# Patient Record
Sex: Female | Born: 1960 | Race: Black or African American | Hispanic: No | Marital: Single
Health system: Southern US, Community
[De-identification: ages and names within clinical notes are randomized; demographics above are authoritative.]

## PROBLEM LIST (undated history)

## (undated) DIAGNOSIS — E119 Type 2 diabetes mellitus without complications: Secondary | ICD-10-CM

## (undated) DIAGNOSIS — K219 Gastro-esophageal reflux disease without esophagitis: Secondary | ICD-10-CM

## (undated) DIAGNOSIS — M1990 Unspecified osteoarthritis, unspecified site: Secondary | ICD-10-CM

## (undated) DIAGNOSIS — G4730 Sleep apnea, unspecified: Secondary | ICD-10-CM

## (undated) DIAGNOSIS — R011 Cardiac murmur, unspecified: Secondary | ICD-10-CM

## (undated) DIAGNOSIS — D649 Anemia, unspecified: Secondary | ICD-10-CM

## (undated) DIAGNOSIS — I10 Essential (primary) hypertension: Secondary | ICD-10-CM

## (undated) DIAGNOSIS — K469 Unspecified abdominal hernia without obstruction or gangrene: Secondary | ICD-10-CM

## (undated) DIAGNOSIS — F329 Major depressive disorder, single episode, unspecified: Secondary | ICD-10-CM

## (undated) DIAGNOSIS — T4145XA Adverse effect of unspecified anesthetic, initial encounter: Secondary | ICD-10-CM

## (undated) DIAGNOSIS — T8859XA Other complications of anesthesia, initial encounter: Secondary | ICD-10-CM

## (undated) DIAGNOSIS — F32A Depression, unspecified: Secondary | ICD-10-CM

## (undated) DIAGNOSIS — J381 Polyp of vocal cord and larynx: Secondary | ICD-10-CM

## (undated) DIAGNOSIS — J45909 Unspecified asthma, uncomplicated: Secondary | ICD-10-CM

## (undated) DIAGNOSIS — E039 Hypothyroidism, unspecified: Secondary | ICD-10-CM

## (undated) DIAGNOSIS — I639 Cerebral infarction, unspecified: Secondary | ICD-10-CM

## (undated) HISTORY — PX: BREAST BIOPSY: SHX20

## (undated) HISTORY — DX: Type 2 diabetes mellitus without complications: E11.9

## (undated) HISTORY — DX: Hypothyroidism, unspecified: E03.9

## (undated) HISTORY — PX: FOOT SURGERY: SHX648

## (undated) HISTORY — DX: Essential (primary) hypertension: I10

## (undated) HISTORY — PX: BREAST SURGERY: SHX581

## (undated) HISTORY — DX: Unspecified asthma, uncomplicated: J45.909

---

## 2004-05-05 HISTORY — PX: GASTRIC BYPASS: SHX52

## 2010-12-11 ENCOUNTER — Other Ambulatory Visit: Payer: Self-pay | Admitting: Family Medicine

## 2010-12-11 DIAGNOSIS — N63 Unspecified lump in unspecified breast: Secondary | ICD-10-CM

## 2010-12-18 ENCOUNTER — Ambulatory Visit
Admission: RE | Admit: 2010-12-18 | Discharge: 2010-12-18 | Disposition: A | Discharge status: Home / Self Care | Payer: Self-pay | Source: Ambulatory Visit | Attending: Family Medicine | Admitting: Family Medicine

## 2010-12-18 DIAGNOSIS — N63 Unspecified lump in unspecified breast: Secondary | ICD-10-CM

## 2013-05-05 HISTORY — PX: HERNIA REPAIR: SHX51

## 2013-11-21 DIAGNOSIS — M7989 Other specified soft tissue disorders: Secondary | ICD-10-CM | POA: Insufficient documentation

## 2014-12-06 DIAGNOSIS — F1120 Opioid dependence, uncomplicated: Secondary | ICD-10-CM | POA: Insufficient documentation

## 2015-05-06 HISTORY — PX: CYST EXCISION: SHX5701

## 2015-06-15 ENCOUNTER — Ambulatory Visit: Payer: Self-pay | Admitting: Internal Medicine

## 2016-03-14 DIAGNOSIS — E039 Hypothyroidism, unspecified: Secondary | ICD-10-CM | POA: Diagnosis not present

## 2016-03-14 DIAGNOSIS — Z23 Encounter for immunization: Secondary | ICD-10-CM | POA: Diagnosis not present

## 2016-03-14 DIAGNOSIS — I10 Essential (primary) hypertension: Secondary | ICD-10-CM | POA: Diagnosis not present

## 2016-03-14 DIAGNOSIS — E119 Type 2 diabetes mellitus without complications: Secondary | ICD-10-CM | POA: Diagnosis not present

## 2016-03-14 DIAGNOSIS — K432 Incisional hernia without obstruction or gangrene: Secondary | ICD-10-CM | POA: Diagnosis not present

## 2016-03-19 DIAGNOSIS — I10 Essential (primary) hypertension: Secondary | ICD-10-CM | POA: Diagnosis not present

## 2016-03-19 DIAGNOSIS — E039 Hypothyroidism, unspecified: Secondary | ICD-10-CM | POA: Diagnosis not present

## 2016-03-19 DIAGNOSIS — Z780 Asymptomatic menopausal state: Secondary | ICD-10-CM | POA: Diagnosis not present

## 2016-03-19 DIAGNOSIS — E119 Type 2 diabetes mellitus without complications: Secondary | ICD-10-CM | POA: Diagnosis not present

## 2016-03-19 DIAGNOSIS — E782 Mixed hyperlipidemia: Secondary | ICD-10-CM | POA: Diagnosis not present

## 2016-03-21 DIAGNOSIS — D649 Anemia, unspecified: Secondary | ICD-10-CM | POA: Diagnosis not present

## 2016-03-21 DIAGNOSIS — E039 Hypothyroidism, unspecified: Secondary | ICD-10-CM | POA: Diagnosis not present

## 2016-03-21 DIAGNOSIS — E782 Mixed hyperlipidemia: Secondary | ICD-10-CM | POA: Diagnosis not present

## 2016-03-21 DIAGNOSIS — E119 Type 2 diabetes mellitus without complications: Secondary | ICD-10-CM | POA: Diagnosis not present

## 2016-04-02 DIAGNOSIS — D500 Iron deficiency anemia secondary to blood loss (chronic): Secondary | ICD-10-CM | POA: Diagnosis not present

## 2016-04-02 DIAGNOSIS — E039 Hypothyroidism, unspecified: Secondary | ICD-10-CM | POA: Diagnosis not present

## 2016-04-02 DIAGNOSIS — E782 Mixed hyperlipidemia: Secondary | ICD-10-CM | POA: Diagnosis not present

## 2016-04-02 DIAGNOSIS — R9431 Abnormal electrocardiogram [ECG] [EKG]: Secondary | ICD-10-CM | POA: Diagnosis not present

## 2016-04-18 DIAGNOSIS — R9431 Abnormal electrocardiogram [ECG] [EKG]: Secondary | ICD-10-CM | POA: Diagnosis not present

## 2016-04-18 DIAGNOSIS — I10 Essential (primary) hypertension: Secondary | ICD-10-CM | POA: Diagnosis not present

## 2016-04-18 DIAGNOSIS — Z01818 Encounter for other preprocedural examination: Secondary | ICD-10-CM | POA: Diagnosis not present

## 2016-04-18 DIAGNOSIS — R011 Cardiac murmur, unspecified: Secondary | ICD-10-CM | POA: Diagnosis not present

## 2016-04-24 DIAGNOSIS — R0602 Shortness of breath: Secondary | ICD-10-CM | POA: Diagnosis not present

## 2016-04-24 DIAGNOSIS — R9431 Abnormal electrocardiogram [ECG] [EKG]: Secondary | ICD-10-CM | POA: Diagnosis not present

## 2016-04-24 DIAGNOSIS — I10 Essential (primary) hypertension: Secondary | ICD-10-CM | POA: Diagnosis not present

## 2016-04-30 DIAGNOSIS — R011 Cardiac murmur, unspecified: Secondary | ICD-10-CM | POA: Diagnosis not present

## 2016-05-07 ENCOUNTER — Encounter: Payer: Self-pay | Admitting: Physician Assistant

## 2016-05-07 DIAGNOSIS — E039 Hypothyroidism, unspecified: Secondary | ICD-10-CM | POA: Diagnosis not present

## 2016-05-07 DIAGNOSIS — D500 Iron deficiency anemia secondary to blood loss (chronic): Secondary | ICD-10-CM | POA: Diagnosis not present

## 2016-05-13 DIAGNOSIS — I10 Essential (primary) hypertension: Secondary | ICD-10-CM | POA: Diagnosis not present

## 2016-05-13 DIAGNOSIS — I351 Nonrheumatic aortic (valve) insufficiency: Secondary | ICD-10-CM | POA: Diagnosis not present

## 2016-05-13 DIAGNOSIS — R9431 Abnormal electrocardiogram [ECG] [EKG]: Secondary | ICD-10-CM | POA: Diagnosis not present

## 2016-05-13 DIAGNOSIS — E7800 Pure hypercholesterolemia, unspecified: Secondary | ICD-10-CM | POA: Diagnosis not present

## 2016-05-14 ENCOUNTER — Encounter: Payer: Self-pay | Admitting: Physician Assistant

## 2016-05-14 DIAGNOSIS — I10 Essential (primary) hypertension: Secondary | ICD-10-CM | POA: Diagnosis not present

## 2016-05-14 DIAGNOSIS — E039 Hypothyroidism, unspecified: Secondary | ICD-10-CM | POA: Diagnosis not present

## 2016-05-14 DIAGNOSIS — D500 Iron deficiency anemia secondary to blood loss (chronic): Secondary | ICD-10-CM | POA: Diagnosis not present

## 2016-05-14 DIAGNOSIS — E1165 Type 2 diabetes mellitus with hyperglycemia: Secondary | ICD-10-CM | POA: Diagnosis not present

## 2016-05-22 ENCOUNTER — Ambulatory Visit: Payer: Self-pay | Admitting: Physician Assistant

## 2016-05-30 ENCOUNTER — Encounter: Payer: Self-pay | Admitting: Physician Assistant

## 2016-05-30 ENCOUNTER — Encounter (INDEPENDENT_AMBULATORY_CARE_PROVIDER_SITE_OTHER): Payer: Self-pay

## 2016-05-30 ENCOUNTER — Ambulatory Visit (INDEPENDENT_AMBULATORY_CARE_PROVIDER_SITE_OTHER): Payer: Medicare Other | Admitting: Physician Assistant

## 2016-05-30 VITALS — BP 128/74 | HR 76 | Ht <= 58 in | Wt 135.0 lb

## 2016-05-30 DIAGNOSIS — D509 Iron deficiency anemia, unspecified: Secondary | ICD-10-CM

## 2016-05-30 DIAGNOSIS — K219 Gastro-esophageal reflux disease without esophagitis: Secondary | ICD-10-CM | POA: Diagnosis not present

## 2016-05-30 MED ORDER — NA SULFATE-K SULFATE-MG SULF 17.5-3.13-1.6 GM/177ML PO SOLN: 1.0000 | ORAL | 0 refills | Status: DC

## 2016-05-30 NOTE — Patient Instructions (Signed)

## 2016-05-30 NOTE — Progress Notes (Signed)
Reviewed and agree with management plan.  Nishant Schrecengost T. Verley Pariseau, MD FACG 

## 2016-05-30 NOTE — Progress Notes (Signed)
Chief Complaint: IDA, GERD  HPI:  Paula Hall is a 56 year old African-American female with a past medical history of asthma, diabetes, hypertension and hypothyroidism, status post gastric bypass in 2016 and multiple abdominal hernia repairs, who presents to clinic today as a referral from Dr. Ashby Dawes for a complaint of iron deficiency anemia.   Patient had recent labs completed 05/07/16 which showed a CBC with a hemoglobin low at 10 as well as an MCV low at 76.4, ferritin was low at 13, percent ferritin saturation was low at 9, CMP showed an alkaline phosphatase elevated at 130 and was otherwise normal   Today, the patient presents to clinic and tells me that she recently moved to [place] from [place] around 8 months ago. It should be noted that she is a poor historian and very hard to follow. She has a history of gastric bypass in 2016 per her report. She also has history of multiple abdominal hernia wall repairs for which she continues to wear a stomach support brace. The patient tells me that she has been anemic for "a couple of years", and apparently has been on iron since that time, which is slowly improving her hemoglobin. She denies ever having a previous colonoscopy but does believe she had an endoscopy before her gastric bypass.     Patient admits that she has chronic reflux and is controlled on Omeprazole 40 mg daily.     She does note that she has lost around 75 pounds in the past few years but believes most of this was related to an uncontrolled thyroid level.   Patient is an asthmatic but denies being on oxygen at home or previous seizures or problems with anesthesia in the past.   Patient denies fever, chills, seeing bright red blood in her stool, melena, change in bowel habits, fatigue, anorexia, abdominal pain or symptoms that awaken her at night.  Past Medical History:  Diagnosis Date  . Asthma   . Diabetes ([place])   . Hypertension   . Hypothyroid     Past Surgical  History:  Procedure Laterality Date  . CYST EXCISION  2017   abdomen  . FOOT SURGERY    . GASTRIC BYPASS  2006  . HERNIA REPAIR  2015    Current Outpatient Prescriptions  Medication Sig Dispense Refill  . albuterol (PROVENTIL HFA;VENTOLIN HFA) 108 (90 Base) MCG/ACT inhaler Inhale into the lungs every 6 (six) hours as needed for wheezing or shortness of breath.    Marland Kitchen amLODipine (NORVASC) 10 MG tablet Take 10 mg by mouth daily.    Marland Kitchen ammonium lactate (AMLACTIN) 12 % cream Apply topically as needed for dry skin.    Marland Kitchen beclomethasone (QVAR) 40 MCG/ACT inhaler Inhale 2 puffs into the lungs 2 (two) times daily as needed.    . betamethasone dipropionate (DIPROLENE) 0.05 % cream Apply topically 2 (two) times daily.    . clotrimazole (LOTRIMIN) 1 % cream Apply 1 application topically 2 (two) times daily.    . Diclofenac Sodium 1.5 % SOLN Place onto the skin.    . famotidine (PEPCID) 20 MG tablet Take 20 mg by mouth 2 (two) times daily.    Marland Kitchen ibuprofen (ADVIL,MOTRIN) 800 MG tablet Take 800 mg by mouth every 8 (eight) hours as needed.    . Lancets (FREESTYLE) lancets 1 each by Other route as needed for other. Use as instructed    . meloxicam (MOBIC) 15 MG tablet Take 15 mg by mouth daily.    . metoprolol  tartrate (LOPRESSOR) 25 MG tablet Take 25 mg by mouth 2 (two) times daily.    . nicotine (NICODERM CQ - DOSED IN MG/24 HOURS) 14 mg/24hr patch Place 14 mg onto the skin daily.    Marland Kitchen omeprazole (PRILOSEC) 40 MG capsule Take 40 mg by mouth daily.    . ranitidine (ZANTAC) 300 MG capsule Take 300 mg by mouth every evening.     No current facility-administered medications for this visit.     Allergies as of 05/30/2016  . (No Known Allergies)    Family History  Problem Relation Age of Onset  . Kidney cancer Mother   . Other Father     murder  . Colon cancer Neg Hx   . Stomach cancer Neg Hx     Social History   Social History  . Marital status: Single    Spouse name: N/A  . Number of  children: N/A  . Years of education: N/A   Occupational History  . retired    Social History Main Topics  . Smoking status: Current Every Day Smoker    Types: Cigarettes  . Smokeless tobacco: Never Used  . Alcohol use Yes     Comment: occ   . Drug use: No  . Sexual activity: No   Other Topics Concern  . Not on file   Social History Narrative  . No narrative on file    Review of Systems:    Constitutional: No weight loss, fever, chills, weakness or fatigue HEENT: Eyes: No change in vision               Ears, Nose, Throat:  Positive for sore throat and voice changes Skin: Positive for itching and skin rash Cardiovascular: No chest pain, chest pressure or palpitations   Respiratory: Positive for shortness of breath Gastrointestinal: See HPI and otherwise negative Genitourinary: No dysuria or change in urinary frequency Neurological: No headache, dizziness or syncope Musculoskeletal: Positive for chronic back pain Hematologic: No bleeding or bruising Psychiatric: Positive for depression and sleeping problems   Physical Exam:  Vital signs: BP 128/74   Pulse 76   Ht 4\' 10"  (1.473 m)   Wt 135 lb (61.2 kg)   BMI 28.22 kg/m   Constitutional: African American female appears to be in NAD, Well developed, Well nourished, alert and cooperative Head:  Normocephalic and atraumatic. Eyes:   PEERL, EOMI. No icterus. Conjunctiva pink. Ears:  Normal auditory acuity. Neck:  Supple Throat: Oral cavity and pharynx without inflammation, swelling or lesion.  Respiratory: Respirations even and unlabored. Lungs clear to auscultation bilaterally.   No wheezes, crackles, or rhonchi.  Cardiovascular: Normal S1, S2. No MRG. Regular rate and rhythm. No peripheral edema, cyanosis or pallor.  Gastrointestinal:  Soft, nondistended, mild TTP over area of abdominal wall hernia. No rebound or guarding. Normal bowel sounds. No appreciable masses or hepatomegaly. Rectal:  Not performed.  Msk:   Symmetrical without gross deformities. Without edema, no deformity or joint abnormality.  Neurologic:  Alert and  oriented x4;  grossly normal neurologically.  Skin:   Dry and intact without significant lesions or rashes. Psychiatric: Demonstrates good judgement and reason without abnormal affect or behaviors.  See history of present illness for recent labs.  Assessment: 1. IDA: See labs in history of present illness, patient currently on iron supplement, no previous workup, history of reflux; consider upper versus lower source of GI blood loss versus anemia after gastric bypass 2. GERD: Controlled on Omeprazole 40 mg daily  Plan: 1. Recommend patient proceed with an EGD and colonoscopy for further evaluation of her iron deficiency anemia. These were scheduled with Dr. Fuller Plan and [place]. Discussed risks, benefits, limitations and alternatives and the patient agrees to proceed. 2. We will request cardiac clearance as patient recently had a workup for a new heart murmur. Per referring physicians note, she has been cleared for procedures but we will request clarification. 3. Patient to follow with her PCP regarding abdominal wall hernia and possible referral to a surgeon in the area 4. Continue iron supplementation as previously prescribed 5. Patient to follow in clinic for Dr. Lynne Leader recommendations after time of procedures.  Ellouise Newer, PA-C [facility] 05/30/2016, 9:58 AM  Cc: Dr. Ashby Dawes

## 2016-06-12 DIAGNOSIS — J069 Acute upper respiratory infection, unspecified: Secondary | ICD-10-CM | POA: Diagnosis not present

## 2016-06-12 DIAGNOSIS — R05 Cough: Secondary | ICD-10-CM | POA: Diagnosis not present

## 2016-07-24 ENCOUNTER — Telehealth: Payer: Self-pay | Admitting: Gastroenterology

## 2016-07-24 NOTE — Telephone Encounter (Signed)
Returned patient's call and she states that she ate breakfast ham, eggs, and cheese with coffee and a ham and cheese sandwich on toast for lunch around 3:30 pm.  She is scheduled for ECL tomorrow at 2:30 and will start her prep at 6:00 pm today.  I advised her not to eat anything else solid today but to follow the list exactly as it is on her instructions.  Expressed the importance of following the directions exactly as given.  Told patient that she cannot have anything by mouth 3 hours prior to her procedure. She asked about taking her medications and I advised her that she can take her medications in the morning as long as it is before her cut off time.  Told her not to take her diabetic medications in the morning.  She will arrive at 1:30 pm for a 2:30 procedure tomorrow.  All questions were answered.

## 2016-07-25 ENCOUNTER — Ambulatory Visit (AMBULATORY_SURGERY_CENTER): Payer: Medicare Other | Admitting: Gastroenterology

## 2016-07-25 ENCOUNTER — Encounter: Payer: Self-pay | Admitting: Gastroenterology

## 2016-07-25 VITALS — BP 122/70 | HR 61 | Temp 98.6°F | Resp 12 | Ht <= 58 in | Wt 135.0 lb

## 2016-07-25 DIAGNOSIS — K219 Gastro-esophageal reflux disease without esophagitis: Secondary | ICD-10-CM | POA: Diagnosis not present

## 2016-07-25 DIAGNOSIS — D120 Benign neoplasm of cecum: Secondary | ICD-10-CM

## 2016-07-25 DIAGNOSIS — D649 Anemia, unspecified: Secondary | ICD-10-CM | POA: Diagnosis not present

## 2016-07-25 DIAGNOSIS — K2950 Unspecified chronic gastritis without bleeding: Secondary | ICD-10-CM | POA: Diagnosis not present

## 2016-07-25 DIAGNOSIS — D126 Benign neoplasm of colon, unspecified: Secondary | ICD-10-CM

## 2016-07-25 DIAGNOSIS — D124 Benign neoplasm of descending colon: Secondary | ICD-10-CM

## 2016-07-25 DIAGNOSIS — K621 Rectal polyp: Secondary | ICD-10-CM | POA: Diagnosis not present

## 2016-07-25 DIAGNOSIS — D508 Other iron deficiency anemias: Secondary | ICD-10-CM

## 2016-07-25 DIAGNOSIS — K635 Polyp of colon: Secondary | ICD-10-CM | POA: Diagnosis not present

## 2016-07-25 DIAGNOSIS — D125 Benign neoplasm of sigmoid colon: Secondary | ICD-10-CM

## 2016-07-25 DIAGNOSIS — D128 Benign neoplasm of rectum: Secondary | ICD-10-CM

## 2016-07-25 MED ORDER — SODIUM CHLORIDE 0.9 % IV SOLN: 500.0000 mL | INTRAVENOUS | Status: AC

## 2016-07-25 NOTE — Patient Instructions (Signed)
YOU HAD AN ENDOSCOPIC PROCEDURE TODAY AT [facility]:   Refer to the procedure report that was given to you for any specific questions about what was found during the examination.  If the procedure report does not answer your questions, please call your gastroenterologist to clarify.  If you requested that your care partner not be given the details of your procedure findings, then the procedure report has been included in a sealed envelope for you to review at your convenience later.  YOU SHOULD EXPECT: Some feelings of bloating in the abdomen. Passage of more gas than usual.  Walking can help get rid of the air that was put into your GI tract during the procedure and reduce the bloating. If you had a lower endoscopy (such as a colonoscopy or flexible sigmoidoscopy) you may notice spotting of blood in your stool or on the toilet paper. If you underwent a bowel prep for your procedure, you may not have a normal bowel movement for a few days.  Please Note:  You might notice some irritation and congestion in your nose or some drainage.  This is from the oxygen used during your procedure.  There is no need for concern and it should clear up in a day or so.  SYMPTOMS TO REPORT IMMEDIATELY:   Following lower endoscopy (colonoscopy or flexible sigmoidoscopy):  Excessive amounts of blood in the stool  Significant tenderness or worsening of abdominal pains  Swelling of the abdomen that is new, acute  Fever of 100F or higher   Following upper endoscopy (EGD)  Vomiting of blood or coffee ground material  New chest pain or pain under the shoulder blades  Painful or persistently difficult swallowing  New shortness of breath  Fever of 100F or higher  Black, tarry-looking stools  For urgent or emergent issues, a gastroenterologist can be reached at any hour by calling [phone].   DIET:  We do recommend a small meal at first, but then you may proceed to your regular diet.  Drink  plenty of fluids but you should avoid alcoholic beverages for 24 hours.  ACTIVITY:  You should plan to take it easy for the rest of today and you should NOT DRIVE or use heavy machinery until tomorrow (because of the sedation medicines used during the test).    FOLLOW UP: Our staff will call the number listed on your records the next business day following your procedure to check on you and address any questions or concerns that you may have regarding the information given to you following your procedure. If we do not reach you, we will leave a message.  However, if you are feeling well and you are not experiencing any problems, there is no need to return our call.  We will assume that you have returned to your regular daily activities without incident.  If any biopsies were taken you will be contacted by phone or by letter within the next 1-3 weeks.  Please call us at [phone] if you have not heard about the biopsies in 3 weeks.    SIGNATURES/CONFIDENTIALITY: You and/or your care partner have signed paperwork which will be entered into your electronic medical record.  These signatures attest to the fact that that the information above on your After Visit Summary has been reviewed and is understood.  Full responsibility of the confidentiality of this discharge information lies with you and/or your care-partner.  Polyp and hiatal hernia information given. No aspirin, ibuprofen, naproxen or  other non -steroidal anti-inflammatory for 2 weeks.

## 2016-07-25 NOTE — Progress Notes (Signed)
Pt's states no medical or surgical changes since previsit or office visit. 

## 2016-07-25 NOTE — Op Note (Signed)
[place] Patient Name: Paula Hall Procedure Date: 07/25/2016 2:19 PM MRN: [id number] Endoscopist: Ladene Artist , MD Age: 56 Referring MD:  Date of Birth: 1960/12/02 Gender: Female Account #: [id number] Procedure:                Colonoscopy Indications:              Iron deficiency anemia Medicines:                Monitored Anesthesia Care Procedure:                Pre-Anesthesia Assessment:                           - Prior to the procedure, a History and Physical                            was performed, and patient medications and                            allergies were reviewed. The patient's tolerance of                            previous anesthesia was also reviewed. The risks                            and benefits of the procedure and the sedation                            options and risks were discussed with the patient.                            All questions were answered, and informed consent                            was obtained. Prior Anticoagulants: The patient has                            taken no previous anticoagulant or antiplatelet                            agents. ASA Grade Assessment: III - A patient with                            severe systemic disease. After reviewing the risks                            and benefits, the patient was deemed in                            satisfactory condition to undergo the procedure.                           After obtaining informed consent, the colonoscope  was passed under direct vision. Throughout the                            procedure, the patient's blood pressure, pulse, and                            oxygen saturations were monitored continuously. The                            Colonoscope was introduced through the anus and                            advanced to the the cecum, identified by                            appendiceal orifice and ileocecal valve. The                             ileocecal valve, appendiceal orifice, and rectum                            were photographed. The quality of the bowel                            preparation was good. The colonoscopy was performed                            without difficulty. The patient tolerated the                            procedure well. Scope In: 2:24:26 PM Scope Out: 2:48:45 PM Scope Withdrawal Time: 0 hours 22 minutes 46 seconds  Total Procedure Duration: 0 hours 24 minutes 19 seconds  Findings:                 The perianal and digital rectal examinations were                            normal.                           A 10 mm polyp was found in the cecum. The polyp was                            pedunculated. The polyp was removed with a hot                            snare. Resection and retrieval were complete.                           Four sessile and semi-pedunculated polyps were                            found in the rectum (1) and sigmoid colon (3). The  polyps were 8 to 12 mm in size. These polyps were                            removed with a hot snare. Resection and retrieval                            were complete.                           34 sessile sessile polyps were found in the rectum                            (6), sigmoid colon (27) and descending colon (1).                            The polyps were 5 to 8 mm in size. These polyps                            were removed with a cold snare. Resection and                            retrieval were complete.                           The exam was otherwise without abnormality on                            direct and retroflexion views. Complications:            No immediate complications. Estimated blood loss:                            None. Estimated Blood Loss:     Estimated blood loss: none. Impression:               - One 10 mm polyp in the cecum, removed with a hot                             snare. Resected and retrieved.                           - Four 8 to 12 mm polyps in the rectum and in the                            sigmoid colon, removed with a hot snare. Resected                            and retrieved.                           - 34 sessile 5 to 8 mm polyps in the rectum, in the                            sigmoid colon and  in the descending colon, removed                            with a cold snare. Resected and retrieved.                           - The examination was otherwise normal on direct                            and retroflexion views. Recommendation:           - Repeat colonoscopy for surveillance based on                            pathology results.                           - Patient has a contact number available for                            emergencies. The signs and symptoms of potential                            delayed complications were discussed with the                            patient. Return to normal activities tomorrow.                            Written discharge instructions were provided to the                            patient.                           - Resume previous diet.                           - Continue present medications.                           - Await pathology results.                           - No aspirin, ibuprofen, naproxen, or other                            non-steroidal anti-inflammatory drugs for 2 weeks                            after polyp removal. Ladene Artist, MD 07/25/2016 3:01:26 PM This report has been signed electronically.

## 2016-07-25 NOTE — Progress Notes (Signed)
Patient awakening,vss,report to rn 

## 2016-07-25 NOTE — Op Note (Signed)
Paula Hall Patient Name: Paula Hall Procedure Date: 07/25/2016 2:19 PM MRN: [id number] Endoscopist: Ladene Artist , MD Age: 56 Referring MD:  Date of Birth: [birth year] Gender: Female Account #: [id number] Procedure:                Upper GI endoscopy Indications:              Iron deficiency anemia, Gastro-esophageal reflux                            disease Medicines:                Monitored Anesthesia Care Procedure:                Pre-Anesthesia Assessment:                           - Prior to the procedure, a History and Physical                            was performed, and patient medications and                            allergies were reviewed. The patient's tolerance of                            previous anesthesia was also reviewed. The risks                            and benefits of the procedure and the sedation                            options and risks were discussed with the patient.                            All questions were answered, and informed consent                            was obtained. Prior Anticoagulants: The patient has                            taken no previous anticoagulant or antiplatelet                            agents. ASA Grade Assessment: III - A patient with                            severe systemic disease. After reviewing the risks                            and benefits, the patient was deemed in                            satisfactory condition to undergo the procedure.  After obtaining informed consent, the endoscope was                            passed under direct vision. Throughout the                            procedure, the patient's blood pressure, pulse, and                            oxygen saturations were monitored continuously. The                            Endoscope was introduced through the mouth, and                            advanced to the second part of duodenum. The  upper                            GI endoscopy was accomplished without difficulty.                            The patient tolerated the procedure well. Scope In: Scope Out: Findings:                 The examined esophagus was normal.                           Evidence of a gastric bypass was found. A gastric                            pouch with a normal size was found. The staple line                            appeared intact. The gastrojejunal anastomosis was                            characterized by healthy appearing mucosa. This was                            traversed. The duodenum-to-jejunum limb was                            examined.                           A small hiatal hernia was present.                           Diffuse mildly erythematous mucosa without bleeding                            was found in the gastric fundus and in the gastric                            body. Biopsies were  taken with a cold forceps for                            histology.                           The examined duodenum was normal. Complications:            No immediate complications. Estimated Blood Loss:     Estimated blood loss was minimal. Impression:               - Normal esophagus.                           - Gastric bypass with a normal-sized pouch and                            intact staple line. Gastrojejunal anastomosis                            characterized by healthy appearing mucosa.                           - Small hiatal hernia.                           - Erythematous mucosa in the gastric fundus and                            gastric body. Biopsied.                           - Normal examined duodenum. Recommendation:           - Patient has a contact number available for                            emergencies. The signs and symptoms of potential                            delayed complications were discussed with the                            patient. Return to  normal activities tomorrow.                            Written discharge instructions were provided to the                            patient.                           - Resume previous diet.                           - Continue present medications.                           -  Await pathology results.                           - Iron deficiency likely related to poor iron                            absorption post gastric bypass. Long term iron                            replacement per PCP. Ladene Artist, MD 07/25/2016 3:05:21 PM This report has been signed electronically.

## 2016-07-25 NOTE — Progress Notes (Signed)
Called to room to assist during endoscopic procedure.  Patient ID and intended procedure confirmed with present staff. Received instructions for my participation in the procedure from the performing physician.  

## 2016-07-28 ENCOUNTER — Telehealth: Payer: Self-pay | Admitting: *Deleted

## 2016-07-28 NOTE — Telephone Encounter (Signed)
  Follow up Call-  Call back number 07/25/2016  Post procedure Call Back phone  # [phone]  Permission to leave phone message Yes  Some recent data might be hidden     Patient questions:  Do you have a fever, pain , or abdominal swelling? No. Pain Score  0 *  Have you tolerated food without any problems? Yes.    Have you been able to return to your normal activities? Yes.    Do you have any questions about your discharge instructions: Diet   No. Medications  No. Follow up visit  No.  Do you have questions or concerns about your Care? No.  Actions: * If pain score is 4 or above: No action needed, pain <4.

## 2016-08-04 ENCOUNTER — Encounter: Payer: Self-pay | Admitting: Gastroenterology

## 2016-09-01 ENCOUNTER — Other Ambulatory Visit: Payer: Self-pay | Admitting: Internal Medicine

## 2016-09-01 DIAGNOSIS — R922 Inconclusive mammogram: Secondary | ICD-10-CM

## 2016-09-01 DIAGNOSIS — N63 Unspecified lump in unspecified breast: Secondary | ICD-10-CM

## 2016-10-03 DIAGNOSIS — M5416 Radiculopathy, lumbar region: Secondary | ICD-10-CM | POA: Diagnosis not present

## 2016-10-03 DIAGNOSIS — M791 Myalgia: Secondary | ICD-10-CM | POA: Diagnosis not present

## 2016-10-03 DIAGNOSIS — M545 Low back pain: Secondary | ICD-10-CM | POA: Diagnosis not present

## 2016-10-04 DIAGNOSIS — Z79891 Long term (current) use of opiate analgesic: Secondary | ICD-10-CM | POA: Diagnosis not present

## 2016-10-13 DIAGNOSIS — H40003 Preglaucoma, unspecified, bilateral: Secondary | ICD-10-CM | POA: Diagnosis not present

## 2016-10-13 DIAGNOSIS — H11153 Pinguecula, bilateral: Secondary | ICD-10-CM | POA: Diagnosis not present

## 2016-10-13 DIAGNOSIS — H35033 Hypertensive retinopathy, bilateral: Secondary | ICD-10-CM | POA: Diagnosis not present

## 2016-10-13 DIAGNOSIS — H25013 Cortical age-related cataract, bilateral: Secondary | ICD-10-CM | POA: Diagnosis not present

## 2016-10-13 DIAGNOSIS — H40013 Open angle with borderline findings, low risk, bilateral: Secondary | ICD-10-CM | POA: Diagnosis not present

## 2016-10-20 ENCOUNTER — Ambulatory Visit
Admission: RE | Admit: 2016-10-20 | Discharge: 2016-10-20 | Disposition: A | Discharge status: Home / Self Care | Payer: Medicare Other | Source: Ambulatory Visit | Attending: Internal Medicine | Admitting: Internal Medicine

## 2016-10-20 DIAGNOSIS — R922 Inconclusive mammogram: Secondary | ICD-10-CM

## 2016-10-20 DIAGNOSIS — N63 Unspecified lump in unspecified breast: Secondary | ICD-10-CM

## 2016-10-22 DIAGNOSIS — J069 Acute upper respiratory infection, unspecified: Secondary | ICD-10-CM | POA: Diagnosis not present

## 2016-10-22 DIAGNOSIS — E119 Type 2 diabetes mellitus without complications: Secondary | ICD-10-CM | POA: Diagnosis not present

## 2016-11-07 ENCOUNTER — Ambulatory Visit
Admission: RE | Admit: 2016-11-07 | Discharge: 2016-11-07 | Disposition: A | Discharge status: Home / Self Care | Payer: Medicare Other | Source: Ambulatory Visit | Attending: Internal Medicine | Admitting: Internal Medicine

## 2016-11-07 ENCOUNTER — Ambulatory Visit: Payer: Medicare Other

## 2016-11-07 ENCOUNTER — Ambulatory Visit: Admission: RE | Admit: 2016-11-07 | Payer: Medicare Other | Source: Ambulatory Visit

## 2016-11-07 DIAGNOSIS — R928 Other abnormal and inconclusive findings on diagnostic imaging of breast: Secondary | ICD-10-CM | POA: Diagnosis not present

## 2016-11-27 DIAGNOSIS — H40003 Preglaucoma, unspecified, bilateral: Secondary | ICD-10-CM | POA: Diagnosis not present

## 2016-11-27 DIAGNOSIS — E119 Type 2 diabetes mellitus without complications: Secondary | ICD-10-CM | POA: Diagnosis not present

## 2017-01-08 DIAGNOSIS — R0600 Dyspnea, unspecified: Secondary | ICD-10-CM | POA: Diagnosis not present

## 2017-01-08 DIAGNOSIS — R05 Cough: Secondary | ICD-10-CM | POA: Diagnosis not present

## 2017-01-08 DIAGNOSIS — J029 Acute pharyngitis, unspecified: Secondary | ICD-10-CM | POA: Diagnosis not present

## 2017-01-08 DIAGNOSIS — R0602 Shortness of breath: Secondary | ICD-10-CM | POA: Diagnosis not present

## 2017-01-08 DIAGNOSIS — E079 Disorder of thyroid, unspecified: Secondary | ICD-10-CM | POA: Diagnosis not present

## 2017-01-08 DIAGNOSIS — I10 Essential (primary) hypertension: Secondary | ICD-10-CM | POA: Diagnosis not present

## 2017-01-08 DIAGNOSIS — Z7984 Long term (current) use of oral hypoglycemic drugs: Secondary | ICD-10-CM | POA: Diagnosis not present

## 2017-01-08 DIAGNOSIS — E119 Type 2 diabetes mellitus without complications: Secondary | ICD-10-CM | POA: Diagnosis not present

## 2017-01-08 DIAGNOSIS — J45909 Unspecified asthma, uncomplicated: Secondary | ICD-10-CM | POA: Diagnosis not present

## 2017-01-09 DIAGNOSIS — Z79899 Other long term (current) drug therapy: Secondary | ICD-10-CM | POA: Diagnosis not present

## 2017-01-22 DIAGNOSIS — Z5181 Encounter for therapeutic drug level monitoring: Secondary | ICD-10-CM | POA: Diagnosis not present

## 2017-01-22 DIAGNOSIS — Z79899 Other long term (current) drug therapy: Secondary | ICD-10-CM | POA: Diagnosis not present

## 2017-02-11 DIAGNOSIS — Z0000 Encounter for general adult medical examination without abnormal findings: Secondary | ICD-10-CM | POA: Diagnosis not present

## 2017-02-18 DIAGNOSIS — Z79899 Other long term (current) drug therapy: Secondary | ICD-10-CM | POA: Diagnosis not present

## 2017-02-18 DIAGNOSIS — Z5181 Encounter for therapeutic drug level monitoring: Secondary | ICD-10-CM | POA: Diagnosis not present

## 2017-02-19 DIAGNOSIS — M13861 Other specified arthritis, right knee: Secondary | ICD-10-CM | POA: Diagnosis not present

## 2017-03-18 DIAGNOSIS — Z79899 Other long term (current) drug therapy: Secondary | ICD-10-CM | POA: Diagnosis not present

## 2017-03-18 DIAGNOSIS — Z5181 Encounter for therapeutic drug level monitoring: Secondary | ICD-10-CM | POA: Diagnosis not present

## 2017-03-25 DIAGNOSIS — M1711 Unilateral primary osteoarthritis, right knee: Secondary | ICD-10-CM | POA: Diagnosis not present

## 2017-03-31 DIAGNOSIS — E039 Hypothyroidism, unspecified: Secondary | ICD-10-CM | POA: Diagnosis not present

## 2017-03-31 DIAGNOSIS — E049 Nontoxic goiter, unspecified: Secondary | ICD-10-CM | POA: Insufficient documentation

## 2017-03-31 DIAGNOSIS — F17200 Nicotine dependence, unspecified, uncomplicated: Secondary | ICD-10-CM | POA: Insufficient documentation

## 2017-03-31 DIAGNOSIS — K219 Gastro-esophageal reflux disease without esophagitis: Secondary | ICD-10-CM | POA: Insufficient documentation

## 2017-03-31 DIAGNOSIS — J381 Polyp of vocal cord and larynx: Secondary | ICD-10-CM | POA: Insufficient documentation

## 2017-03-31 DIAGNOSIS — R490 Dysphonia: Secondary | ICD-10-CM | POA: Insufficient documentation

## 2017-04-01 ENCOUNTER — Other Ambulatory Visit: Payer: Self-pay | Admitting: Otolaryngology

## 2017-04-01 DIAGNOSIS — R490 Dysphonia: Secondary | ICD-10-CM

## 2017-04-01 DIAGNOSIS — E049 Nontoxic goiter, unspecified: Secondary | ICD-10-CM

## 2017-04-01 DIAGNOSIS — F17200 Nicotine dependence, unspecified, uncomplicated: Secondary | ICD-10-CM

## 2017-04-01 DIAGNOSIS — E039 Hypothyroidism, unspecified: Secondary | ICD-10-CM

## 2017-04-01 DIAGNOSIS — K219 Gastro-esophageal reflux disease without esophagitis: Secondary | ICD-10-CM

## 2017-04-05 NOTE — H&P (Signed)
HPI:   Paula Hall is a 56 y.o. female who presents as a consult Patient.   Referring Provider: Laverta Baltimore, MD  Chief complaint: Hoarseness.  HPI: Trouble with hoarseness for about 3 years ever since she had hernia surgery up in [place]. She moved down here to live about a year ago. She is on disability. She smokes 1 pack/day. She drinks significant amount of caffeine every day. She suffers with reflux. She is hypothyroid. She does not have an endocrinologist.  PMH/Meds/All/SocHx/FamHx/ROS:   Past Medical History:  Diagnosis Date  . Asthma  . Diabetes ([place])  . High cholesterol  . Hypertension  . Thyroid disorder   Past Surgical History:  Procedure Laterality Date  . HERNIA REPAIR   No family history of bleeding disorders, wound healing problems or difficulty with anesthesia.   Social History   Social History  . Marital status: N/A  Spouse name: N/A  . Number of children: N/A  . Years of education: N/A   Occupational History  . Not on file.   Social History Main Topics  . Smoking status: Current Every Day Smoker  . Smokeless tobacco: Never Used  . Alcohol use Not on file  . Drug use: Unknown  . Sexual activity: Not on file   Other Topics Concern  . Not on file   Social History Narrative  . No narrative on file   Current Outpatient Prescriptions:  . ADVAIR DISKUS 250-50 mcg/dose diskus inhaler, , Disp: , Rfl:  . amLODIPine (NORVASC) 10 MG tablet, , Disp: , Rfl:  . clonazePAM (KLONOPIN) 1 MG tablet, , Disp: , Rfl:  . FLUARIX QUAD 2018-2019, PF, 60 mcg (15 mcg x 4)/0.5 mL injection, , Disp: , Rfl:  . gabapentin (NEURONTIN) 300 MG capsule, , Disp: , Rfl:  . lancets 30 gauge Misc, , Disp: , Rfl:  . LATUDA 40 mg Tab tablet, , Disp: , Rfl:  . levothyroxine (SYNTHROID, LEVOTHROID) 125 MCG tablet, , Disp: , Rfl:  . meloxicam (MOBIC) 15 MG tablet, , Disp: , Rfl:  . metFORMIN (GLUCOPHAGE) 500 MG tablet, Take by mouth., Disp: , Rfl:  . metoPROLOL tartrate  (LOPRESSOR) 50 MG tablet, , Disp: , Rfl:  . omeprazole (PRILOSEC) 40 MG capsule, , Disp: , Rfl:  . ONETOUCH ULTRASOFT LANCETS lancets, , Disp: , Rfl:  . pantoprazole (PROTONIX) 40 MG tablet, , Disp: , Rfl:  . PROAIR HFA 90 mcg/actuation inhaler, , Disp: , Rfl:  . simvastatin (ZOCOR) 20 MG tablet, , Disp: , Rfl:  . tiZANidine (ZANAFLEX) 4 MG tablet, , Disp: , Rfl:  . traMADol (ULTRAM) 50 mg tablet, , Disp: , Rfl:  . ibuprofen (ADVIL,MOTRIN) 800 MG tablet, , Disp: , Rfl:  . ondansetron (ZOFRAN) 4 MG tablet, , Disp: , Rfl:  . PARoxetine (PAXIL) 40 MG tablet, Take by mouth., Disp: , Rfl:  . zolpidem (AMBIEN) 5 MG tablet, Take by mouth., Disp: , Rfl:   A complete ROS was performed with pertinent positives/negatives noted in the HPI. The remainder of the ROS are negative.   Physical Exam:   Ht 1.473 m (4\' 10" )  Wt 76.2 kg (168 lb)  BMI 35.11 kg/m   General: Healthy and alert, in no distress, breathing easily. Normal affect. In a pleasant mood. Voice is very harsh and strained. Head: Normocephalic, atraumatic. No masses, or scars. Eyes: Pupils are equal, and reactive to light. Vision is grossly intact. No spontaneous or gaze nystagmus. Ears: Ear canals are clear. Tympanic membranes are intact,  with normal landmarks and the middle ears are clear and healthy. Hearing: Grossly normal. Nose: Nasal cavities are clear with healthy mucosa, no polyps or exudate.Airways are patent. Face: No masses or scars, facial nerve function is symmetric. Oral Cavity: No mucosal abnormalities are noted. Tongue with normal mobility. Upper and lower dentures. Oropharynx: Tonsils are symmetric. There are no mucosal masses identified. Tongue base appears normal and healthy. Larynx/Hypopharynx: Incomplete exam. Chest: Deferred Neck: No palpable masses, no cervical adenopathy, diffuse enlargement of thyroid gland. Neuro: Cranial nerves II-XII will normal function. Balance: Normal gate. Other findings:  none.  Independent Review of Additional Tests or Records:  none  Procedures:  Procedure note: Flexible fiberoptic laryngoscopy  Details of the procedure were explained to the patient and all questions were answered.   Procedure:   After anesthetizing the nasal cavity with topical lidocaine and oxymetazoline, the flexible endoscope was introduced and passed through the nasal cavity into the nasopharynx. The scope was then advanced to the level of the oropharynx, then the hypopharynx and larynx.  Findings:   The posterior soft palate, uvula, tongue base and vallecula were visualized and appeared healthy without mucosal masses or lesions. The epiglottis, aryepiglottic folds, hypopharynx, supraglottis, glottis were visualized and the cords seem to move well. 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   Additional findings: None  The scope was withdrawn from the nose. She tolerated the procedure well.   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 Recommend surgical excision of the polyps. Recommend strict caffeine avoidance and smoking cessation.

## 2017-04-06 ENCOUNTER — Ambulatory Visit
Admission: RE | Admit: 2017-04-06 | Discharge: 2017-04-06 | Disposition: A | Discharge status: Home / Self Care | Payer: Medicare Other | Source: Ambulatory Visit | Attending: Otolaryngology | Admitting: Otolaryngology

## 2017-04-06 DIAGNOSIS — E049 Nontoxic goiter, unspecified: Secondary | ICD-10-CM

## 2017-04-06 DIAGNOSIS — E039 Hypothyroidism, unspecified: Secondary | ICD-10-CM

## 2017-04-06 DIAGNOSIS — R490 Dysphonia: Secondary | ICD-10-CM

## 2017-04-06 DIAGNOSIS — K219 Gastro-esophageal reflux disease without esophagitis: Secondary | ICD-10-CM

## 2017-04-06 DIAGNOSIS — F17200 Nicotine dependence, unspecified, uncomplicated: Secondary | ICD-10-CM

## 2017-04-06 DIAGNOSIS — E041 Nontoxic single thyroid nodule: Secondary | ICD-10-CM | POA: Diagnosis not present

## 2017-04-07 ENCOUNTER — Encounter (HOSPITAL_COMMUNITY): Payer: Self-pay | Admitting: *Deleted

## 2017-04-07 ENCOUNTER — Other Ambulatory Visit: Payer: Self-pay

## 2017-04-07 NOTE — Anesthesia Preprocedure Evaluation (Addendum)
Anesthesia Evaluation  Patient identified by MRN, date of birth, ID band  Reviewed: Allergy & Precautions, NPO status , Patient's Chart, lab work & pertinent test results, reviewed documented beta blocker date and time   Airway Mallampati: IV  TM Distance: >3 FB Neck ROM: Full    Dental  (+) Edentulous Upper, Edentulous Lower   Pulmonary asthma , sleep apnea , Current Smoker,    Pulmonary exam normal breath sounds clear to auscultation       Cardiovascular hypertension, Pt. on medications and Pt. on home beta blockers Normal cardiovascular exam Rhythm:Regular Rate:Bradycardia     Neuro/Psych Depression CVA, No Residual Symptoms    GI/Hepatic Neg liver ROS, GERD  Controlled and Medicated,  Endo/Other  diabetesHypothyroidism   Renal/GU negative Renal ROS     Musculoskeletal  (+) Arthritis ,   Abdominal   Peds  Hematology  (+) anemia ,   Anesthesia Other Findings   Reproductive/Obstetrics                            Anesthesia Physical Anesthesia Plan  ASA: III  Anesthesia Plan: General   Post-op Pain Management:    Induction: Intravenous  PONV Risk Score and Plan: 3 and Treatment may vary due to age or medical condition, Ondansetron, Dexamethasone and Midazolam  Airway Management Planned: Oral ETT  Additional Equipment: None  Intra-op Plan:   Post-operative Plan: Extubation in OR  Informed Consent: I have reviewed the patients History and Physical, chart, labs and discussed the procedure including the risks, benefits and alternatives for the proposed anesthesia with the patient or authorized representative who has indicated his/her understanding and acceptance.   Dental advisory given  Plan Discussed with: CRNA  Anesthesia Plan Comments: (Small ETT)        Anesthesia Quick Evaluation

## 2017-04-07 NOTE — Progress Notes (Signed)
Pt denies SOB, chest pain, and being under the care of a cardiologist. Pt stated that a stress test was performed in [place], [place] but is unsure of name of practice and physician. Pt denies having a cardiac cath and echo. Pt denies having an EKG and chest x ray within the last year. Pt made aware to stop taking  Aspirin,vitamins, fish oil and herbal medications. Do not take any NSAIDs ie: Ibuprofen, Advil, Naproxen (Aleve), Motrin, BC and Goody Powder or any medication containing Aspirin. Pt made aware to not take Metformin DOS. Pt made aware to check BG every 2 hours prior to arrival to hospital on DOS. Pt made aware to treat a BG < 70 with  4 ounces of  cranberry juice, wait 15 minutes after intervention to recheck BG, if BG remains < 70, call Short Stay unit to speak with a nurse. PT verbalized understanding of all pre-op instructions.

## 2017-04-08 ENCOUNTER — Ambulatory Visit (HOSPITAL_COMMUNITY)
Admission: RE | Admit: 2017-04-08 | Discharge: 2017-04-08 | Disposition: A | Discharge status: Home / Self Care | Payer: Medicare Other | Source: Ambulatory Visit | Attending: Otolaryngology | Admitting: Otolaryngology

## 2017-04-08 ENCOUNTER — Ambulatory Visit (HOSPITAL_COMMUNITY): Payer: Medicare Other | Admitting: Anesthesiology

## 2017-04-08 ENCOUNTER — Encounter (HOSPITAL_COMMUNITY)
Admission: RE | Disposition: A | Discharge status: Home / Self Care | Payer: Self-pay | Source: Ambulatory Visit | Attending: Otolaryngology

## 2017-04-08 ENCOUNTER — Encounter (HOSPITAL_COMMUNITY): Payer: Self-pay | Admitting: Certified Registered"

## 2017-04-08 DIAGNOSIS — E7800 Pure hypercholesterolemia, unspecified: Secondary | ICD-10-CM | POA: Insufficient documentation

## 2017-04-08 DIAGNOSIS — E079 Disorder of thyroid, unspecified: Secondary | ICD-10-CM | POA: Insufficient documentation

## 2017-04-08 DIAGNOSIS — J381 Polyp of vocal cord and larynx: Secondary | ICD-10-CM | POA: Diagnosis not present

## 2017-04-08 DIAGNOSIS — Z7984 Long term (current) use of oral hypoglycemic drugs: Secondary | ICD-10-CM | POA: Insufficient documentation

## 2017-04-08 DIAGNOSIS — Z79899 Other long term (current) drug therapy: Secondary | ICD-10-CM | POA: Diagnosis not present

## 2017-04-08 DIAGNOSIS — I10 Essential (primary) hypertension: Secondary | ICD-10-CM | POA: Insufficient documentation

## 2017-04-08 DIAGNOSIS — J45909 Unspecified asthma, uncomplicated: Secondary | ICD-10-CM | POA: Diagnosis not present

## 2017-04-08 DIAGNOSIS — E119 Type 2 diabetes mellitus without complications: Secondary | ICD-10-CM | POA: Diagnosis not present

## 2017-04-08 DIAGNOSIS — K219 Gastro-esophageal reflux disease without esophagitis: Secondary | ICD-10-CM | POA: Diagnosis not present

## 2017-04-08 DIAGNOSIS — F17210 Nicotine dependence, cigarettes, uncomplicated: Secondary | ICD-10-CM | POA: Diagnosis not present

## 2017-04-08 DIAGNOSIS — J3802 Paralysis of vocal cords and larynx, bilateral: Secondary | ICD-10-CM | POA: Diagnosis not present

## 2017-04-08 HISTORY — DX: Unspecified abdominal hernia without obstruction or gangrene: K46.9

## 2017-04-08 HISTORY — DX: Cardiac murmur, unspecified: R01.1

## 2017-04-08 HISTORY — DX: Sleep apnea, unspecified: G47.30

## 2017-04-08 HISTORY — DX: Polyp of vocal cord and larynx: J38.1

## 2017-04-08 HISTORY — DX: Depression, unspecified: F32.A

## 2017-04-08 HISTORY — DX: Major depressive disorder, single episode, unspecified: F32.9

## 2017-04-08 HISTORY — PX: MICROLARYNGOSCOPY: SHX5208

## 2017-04-08 HISTORY — DX: Gastro-esophageal reflux disease without esophagitis: K21.9

## 2017-04-08 HISTORY — DX: Anemia, unspecified: D64.9

## 2017-04-08 HISTORY — DX: Unspecified osteoarthritis, unspecified site: M19.90

## 2017-04-08 HISTORY — DX: Cerebral infarction, unspecified: I63.9

## 2017-04-08 HISTORY — DX: Adverse effect of unspecified anesthetic, initial encounter: T41.45XA

## 2017-04-08 HISTORY — DX: Other complications of anesthesia, initial encounter: T88.59XA

## 2017-04-08 LAB — CBC
HEMATOCRIT: 40.1 % (ref 36.0–46.0)
HEMOGLOBIN: 13.6 g/dL (ref 12.0–15.0)
MCH: 29.8 pg (ref 26.0–34.0)
MCHC: 33.9 g/dL (ref 30.0–36.0)
MCV: 87.7 fL (ref 78.0–100.0)
Platelets: 270 10*3/uL (ref 150–400)
RBC: 4.57 MIL/uL (ref 3.87–5.11)
RDW: 14.9 % (ref 11.5–15.5)
WBC: 6.3 10*3/uL (ref 4.0–10.5)

## 2017-04-08 LAB — BASIC METABOLIC PANEL
ANION GAP: 11 (ref 5–15)
BUN: 13 mg/dL (ref 6–20)
CHLORIDE: 103 mmol/L (ref 101–111)
CO2: 26 mmol/L (ref 22–32)
Calcium: 9 mg/dL (ref 8.9–10.3)
Creatinine, Ser: 0.83 mg/dL (ref 0.44–1.00)
GFR calc Af Amer: >60 mL/min (ref >60)
GLUCOSE: 96 mg/dL (ref 65–99)
POTASSIUM: 3.6 mmol/L (ref 3.5–5.1)
Sodium: 140 mmol/L (ref 135–145)

## 2017-04-08 LAB — GLUCOSE, CAPILLARY
Glucose-Capillary: 88 mg/dL (ref 65–99)
Glucose-Capillary: 95 mg/dL (ref 65–99)

## 2017-04-08 LAB — HEMOGLOBIN A1C
Hgb A1c MFr Bld: 5.7 % — ABNORMAL HIGH (ref 4.8–5.6)
MEAN PLASMA GLUCOSE: 116.89 mg/dL

## 2017-04-08 SURGERY — MICROLARYNGOSCOPY
Anesthesia: General | Site: Throat | Laterality: Right

## 2017-04-08 MED ORDER — PROPOFOL 10 MG/ML IV BOLUS
INTRAVENOUS | Status: DC | PRN
  Administered 2017-04-08: 40 mg via INTRAVENOUS
  Administered 2017-04-08: 120 mg via INTRAVENOUS

## 2017-04-08 MED ORDER — SUGAMMADEX SODIUM 200 MG/2ML IV SOLN
INTRAVENOUS | Status: AC
  Filled 2017-04-08: qty 2

## 2017-04-08 MED ORDER — LIDOCAINE 2% (20 MG/ML) 5 ML SYRINGE
INTRAMUSCULAR | Status: DC | PRN
  Administered 2017-04-08: 80 mg via INTRAVENOUS

## 2017-04-08 MED ORDER — MIDAZOLAM HCL 2 MG/2ML IJ SOLN
INTRAMUSCULAR | Status: AC
  Filled 2017-04-08: qty 2

## 2017-04-08 MED ORDER — METOPROLOL TARTRATE 12.5 MG HALF TABLET
ORAL_TABLET | ORAL | Status: AC
  Administered 2017-04-08: 25 mg via ORAL
  Filled 2017-04-08: qty 2

## 2017-04-08 MED ORDER — FENTANYL CITRATE (PF) 250 MCG/5ML IJ SOLN
INTRAMUSCULAR | Status: AC
  Filled 2017-04-08: qty 5

## 2017-04-08 MED ORDER — LIDOCAINE-EPINEPHRINE 1 %-1:100000 IJ SOLN
INTRAMUSCULAR | Status: AC
  Filled 2017-04-08: qty 1

## 2017-04-08 MED ORDER — FENTANYL CITRATE (PF) 100 MCG/2ML IJ SOLN
25.0000 ug | INTRAMUSCULAR | Status: DC | PRN
  Administered 2017-04-08: 50 ug via INTRAVENOUS

## 2017-04-08 MED ORDER — LACTATED RINGERS IV SOLN: INTRAVENOUS | Status: DC

## 2017-04-08 MED ORDER — LACTATED RINGERS IV SOLN
INTRAVENOUS | Status: DC | PRN
  Administered 2017-04-08: 08:00:00 via INTRAVENOUS

## 2017-04-08 MED ORDER — ROCURONIUM BROMIDE 10 MG/ML (PF) SYRINGE
PREFILLED_SYRINGE | INTRAVENOUS | Status: DC | PRN
  Administered 2017-04-08: 40 mg via INTRAVENOUS
  Administered 2017-04-08: 10 mg via INTRAVENOUS

## 2017-04-08 MED ORDER — METOPROLOL TARTRATE 25 MG PO TABS
25.0000 mg | ORAL_TABLET | Freq: Once | ORAL | Status: AC
  Administered 2017-04-08: 25 mg via ORAL
  Filled 2017-04-08: qty 1

## 2017-04-08 MED ORDER — MIDAZOLAM HCL 2 MG/2ML IJ SOLN
INTRAMUSCULAR | Status: DC | PRN
  Administered 2017-04-08: 2 mg via INTRAVENOUS

## 2017-04-08 MED ORDER — GLYCOPYRROLATE 0.2 MG/ML IV SOSY
PREFILLED_SYRINGE | INTRAVENOUS | Status: DC | PRN
  Administered 2017-04-08: .2 mg via INTRAVENOUS

## 2017-04-08 MED ORDER — PROPOFOL 10 MG/ML IV BOLUS
INTRAVENOUS | Status: AC
  Filled 2017-04-08: qty 20

## 2017-04-08 MED ORDER — FENTANYL CITRATE (PF) 100 MCG/2ML IJ SOLN
INTRAMUSCULAR | Status: DC | PRN
  Administered 2017-04-08 (×2): 50 ug via INTRAVENOUS

## 2017-04-08 MED ORDER — DEXAMETHASONE SODIUM PHOSPHATE 10 MG/ML IJ SOLN
INTRAMUSCULAR | Status: AC
  Filled 2017-04-08: qty 1

## 2017-04-08 MED ORDER — LIDOCAINE 2% (20 MG/ML) 5 ML SYRINGE
INTRAMUSCULAR | Status: AC
  Filled 2017-04-08: qty 5

## 2017-04-08 MED ORDER — EPINEPHRINE HCL (NASAL) 0.1 % NA SOLN
NASAL | Status: DC | PRN
  Administered 2017-04-08: 1 [drp] via TOPICAL

## 2017-04-08 MED ORDER — ONDANSETRON HCL 4 MG/2ML IJ SOLN
INTRAMUSCULAR | Status: DC | PRN
  Administered 2017-04-08: 4 mg via INTRAVENOUS

## 2017-04-08 MED ORDER — ONDANSETRON HCL 4 MG/2ML IJ SOLN
INTRAMUSCULAR | Status: AC
  Filled 2017-04-08: qty 2

## 2017-04-08 MED ORDER — FENTANYL CITRATE (PF) 100 MCG/2ML IJ SOLN
INTRAMUSCULAR | Status: AC
  Filled 2017-04-08: qty 2

## 2017-04-08 MED ORDER — EPINEPHRINE HCL (NASAL) 0.1 % NA SOLN
NASAL | Status: AC
  Filled 2017-04-08: qty 30

## 2017-04-08 MED ORDER — DEXAMETHASONE SODIUM PHOSPHATE 10 MG/ML IJ SOLN
INTRAMUSCULAR | Status: DC | PRN
  Administered 2017-04-08: 10 mg via INTRAVENOUS

## 2017-04-08 MED ORDER — SUGAMMADEX SODIUM 200 MG/2ML IV SOLN
INTRAVENOUS | Status: DC | PRN
  Administered 2017-04-08 (×2): 200 mg via INTRAVENOUS

## 2017-04-08 MED ORDER — LIDOCAINE-EPINEPHRINE 1 %-1:100000 IJ SOLN
INTRAMUSCULAR | Status: DC | PRN
  Administered 2017-04-08: .4 mL

## 2017-04-08 MED ORDER — 0.9 % SODIUM CHLORIDE (POUR BTL) OPTIME
TOPICAL | Status: DC | PRN
  Administered 2017-04-08: 1000 mL

## 2017-04-08 MED ORDER — EPHEDRINE SULFATE-NACL 50-0.9 MG/10ML-% IV SOSY
PREFILLED_SYRINGE | INTRAVENOUS | Status: DC | PRN
  Administered 2017-04-08: 5 mg via INTRAVENOUS

## 2017-04-08 MED ORDER — TRIAMCINOLONE ACETONIDE 40 MG/ML IJ SUSP
INTRAMUSCULAR | Status: AC
  Filled 2017-04-08: qty 5

## 2017-04-08 MED ORDER — ROCURONIUM BROMIDE 10 MG/ML (PF) SYRINGE
PREFILLED_SYRINGE | INTRAVENOUS | Status: AC
  Filled 2017-04-08: qty 5

## 2017-04-08 MED ORDER — PROMETHAZINE HCL 25 MG/ML IJ SOLN: 6.2500 mg | INTRAMUSCULAR | Status: DC | PRN

## 2017-04-08 SURGICAL SUPPLY — 29 items
BANDAGE EYE OVAL (MISCELLANEOUS) ×3 IMPLANT
BLADE SURG 15 STRL LF DISP TIS (BLADE) IMPLANT
BLADE SURG 15 STRL SS (BLADE)
CANISTER SUCT 3000ML PPV (MISCELLANEOUS) ×3 IMPLANT
CONT SPEC 4OZ CLIKSEAL STRL BL (MISCELLANEOUS) ×6 IMPLANT
COVER BACK TABLE 60X90IN (DRAPES) ×3 IMPLANT
COVER MAYO STAND STRL (DRAPES) ×3 IMPLANT
DRAPE HALF SHEET 40X57 (DRAPES) ×3 IMPLANT
DRESSING TELFA 8X10 (GAUZE/BANDAGES/DRESSINGS) ×3 IMPLANT
ELECT REM PT RETURN 9FT ADLT (ELECTROSURGICAL)
ELECTRODE REM PT RTRN 9FT ADLT (ELECTROSURGICAL) IMPLANT
GAUZE SPONGE 4X4 16PLY XRAY LF (GAUZE/BANDAGES/DRESSINGS) ×3 IMPLANT
GLOVE ECLIPSE 7.5 STRL STRAW (GLOVE) ×3 IMPLANT
GUARD TEETH (MISCELLANEOUS) IMPLANT
KIT BASIN OR (CUSTOM PROCEDURE TRAY) ×3 IMPLANT
KIT ROOM TURNOVER OR (KITS) ×3 IMPLANT
NEEDLE PRECISIONGLIDE 27X1.5 (NEEDLE) IMPLANT
NS IRRIG 1000ML POUR BTL (IV SOLUTION) ×3 IMPLANT
PAD ARMBOARD 7.5X6 YLW CONV (MISCELLANEOUS) ×6 IMPLANT
PATTIES SURGICAL .5 X1 (DISPOSABLE) IMPLANT
SOLUTION ANTI FOG 6CC (MISCELLANEOUS) ×3 IMPLANT
SPECIMEN JAR SMALL (MISCELLANEOUS) ×3 IMPLANT
SYR 3ML LL SCALE MARK (SYRINGE) IMPLANT
SYR CONTROL 10ML LL (SYRINGE) IMPLANT
SYR TB 1ML LUER SLIP (SYRINGE) IMPLANT
TOWEL OR 17X24 6PK STRL BLUE (TOWEL DISPOSABLE) ×6 IMPLANT
TUBE CONNECTING 12'X1/4 (SUCTIONS) ×1
TUBE CONNECTING 12X1/4 (SUCTIONS) ×2 IMPLANT
WATER STERILE IRR 1000ML POUR (IV SOLUTION) ×3 IMPLANT

## 2017-04-08 NOTE — Anesthesia Procedure Notes (Signed)
Procedure Name: Intubation Date/Time: 04/08/2017 8:42 AM Performed by: Barrington Ellison, CRNA Pre-anesthesia Checklist: Patient identified, Emergency Drugs available, Suction available and Patient being monitored Patient Re-evaluated:Patient Re-evaluated prior to induction Oxygen Delivery Method: Circle System Utilized Preoxygenation: Pre-oxygenation with 100% oxygen Induction Type: IV induction Ventilation: Mask ventilation without difficulty and Oral airway inserted - appropriate to patient size Laryngoscope Size: Mac and 3 Grade View: Grade I Tube type: Oral Tube size: 6.5 mm Number of attempts: 1 Airway Equipment and Method: Stylet and Oral airway Placement Confirmation: ETT inserted through vocal cords under direct vision,  positive ETCO2 and breath sounds checked- equal and bilateral Secured at: 20 cm Tube secured with: Tape Dental Injury: Teeth and Oropharynx as per pre-operative assessment

## 2017-04-08 NOTE — Op Note (Signed)
OPERATIVE REPORT  DATE OF SURGERY: 04/08/2017  PATIENT:  Paula Hall,  56 y.o. female  PRE-OPERATIVE DIAGNOSIS:  Vocal Cord Polyps  POST-OPERATIVE DIAGNOSIS:  Vocal Cord Polyps  PROCEDURE:  Procedure(s): MICROLARYNGOSCOPY, BILATERAL VOCAL CORD POLYP REMOVAL  SURGEON:  Beckie Salts, MD  ASSISTANTS: None  ANESTHESIA:   General   EBL: 3 ml  DRAINS: None  LOCAL MEDICATIONS USED: 1% Xylocaine with epinephrine  SPECIMEN: 1.  Right vocal cord polyp, 2.  Left vocal cord polyp  COUNTS:  Correct  PROCEDURE DETAILS: The patient was taken to the operating room and placed on the operating table in the supine position. Following induction of general endotracheal anesthesia, the head was draped in a standard fashion.  The patient was edentulous.  A gentle endoscope was entered into the oral cavity used to evaluate the larynx and attached to the Mayo stand with the suspension apparatus.  A rod telescope was used to take photographs before and after.  The operating microscope was brought into view and used throughout the procedure.  Both vocal cords were infiltrated with local anesthetic solution.  The large right polyp was removed first.  Legrand Como laryngoscopy scissors were used to excise the pedunculated mucosa along the superior aspect of the cord and the entire lesion was removed.  There is adequate remaining mucosa for coverage of the cord.  The left side lesion was then removed in a similar fashion although on the side it was less pedunculated and more of a typical gelatinous submucosal material was obtained.  The excess mucosa was trimmed off and sent for pathologic evaluation.  There is adequate mucosa remaining as well.  Topical adrenaline was used on pledgets for hemostasis.  The scope was removed.  The patient was then awakened extubated and transferred to recovery in stable condition.    PATIENT DISPOSITION:  To PACU, stable

## 2017-04-08 NOTE — Anesthesia Postprocedure Evaluation (Signed)
Anesthesia Post Note  Patient: Paula Hall  Procedure(s) Performed: MICROLARYNGOSCOPY, BILATERAL VOCAL CORD POLYP REMOVAL (Right Throat)     Patient location during evaluation: PACU Anesthesia Type: General Level of consciousness: awake and alert Pain management: pain level controlled Vital Signs Assessment: post-procedure vital signs reviewed and stable Respiratory status: spontaneous breathing, nonlabored ventilation, respiratory function stable and patient connected to nasal cannula oxygen Cardiovascular status: blood pressure returned to baseline and stable Postop Assessment: no apparent nausea or vomiting Anesthetic complications: no    Last Vitals:  Vitals:   04/08/17 1008 04/08/17 1022  BP: (!) 142/88 (!) 147/79  Pulse: 65 61  Resp: 15 13  Temp:    SpO2: 97% 92%    Last Pain:  Vitals:   04/08/17 0942  PainSc: [place]

## 2017-04-08 NOTE — Interval H&P Note (Signed)
History and Physical Interval Note:  04/08/2017 8:26 AM  Paula Hall  has presented today for surgery, with the diagnosis of Vocal Cord Polyps  The various methods of treatment have been discussed with the patient and family. After consideration of risks, benefits and other options for treatment, the patient has consented to  Procedure(s): MICROLARYNGOSCOPY, BILATERAL VOCAL CORD POLYP REMOVAL (Right) as a surgical intervention .  The patient's history has been reviewed, patient examined, no change in status, stable for surgery.  I have reviewed the patient's chart and labs.  Questions were answered to the patient's satisfaction.     [name]

## 2017-04-08 NOTE — Discharge Instructions (Signed)
Avoid smoking.  Avoid screaming, whispering, straining of the voice.  Rest your voice is much as possible for the next 2 weeks.  Use Tylenol or Motrin as needed for any discomfort.

## 2017-04-08 NOTE — Transfer of Care (Signed)
Immediate Anesthesia Transfer of Care Note  Patient: Paula Hall  Procedure(s) Performed: MICROLARYNGOSCOPY, BILATERAL VOCAL CORD POLYP REMOVAL (Right Throat)  Patient Location: PACU  Anesthesia Type:General  Level of Consciousness: awake, alert  and oriented  Airway & Oxygen Therapy: Patient Spontanous Breathing and Patient connected to nasal cannula oxygen  Post-op Assessment: Report given to RN  Post vital signs: Reviewed and stable  Last Vitals:  Vitals:   04/08/17 0720 04/08/17 0922  BP:  (!) 151/85  Pulse: (!) 52 72  Resp:  14  Temp:  36.8 C  SpO2:  96%    Last Pain:  Vitals:   04/08/17 0922  PainSc: (P) 0-No pain      Patients Stated Pain Goal: 3 (16/10/96 0454)  Complications: No apparent anesthesia complications

## 2017-04-09 ENCOUNTER — Encounter (HOSPITAL_COMMUNITY): Payer: Self-pay | Admitting: Otolaryngology

## 2017-04-15 DIAGNOSIS — Z1339 Encounter for screening examination for other mental health and behavioral disorders: Secondary | ICD-10-CM | POA: Diagnosis not present

## 2017-04-15 DIAGNOSIS — Z1331 Encounter for screening for depression: Secondary | ICD-10-CM | POA: Diagnosis not present

## 2017-04-15 DIAGNOSIS — M1711 Unilateral primary osteoarthritis, right knee: Secondary | ICD-10-CM | POA: Diagnosis not present

## 2017-04-15 DIAGNOSIS — E7800 Pure hypercholesterolemia, unspecified: Secondary | ICD-10-CM | POA: Diagnosis not present

## 2017-04-15 DIAGNOSIS — Z1159 Encounter for screening for other viral diseases: Secondary | ICD-10-CM | POA: Diagnosis not present

## 2017-04-15 DIAGNOSIS — E1165 Type 2 diabetes mellitus with hyperglycemia: Secondary | ICD-10-CM | POA: Diagnosis not present

## 2017-04-15 DIAGNOSIS — R0602 Shortness of breath: Secondary | ICD-10-CM | POA: Diagnosis not present

## 2017-04-15 DIAGNOSIS — R5383 Other fatigue: Secondary | ICD-10-CM | POA: Diagnosis not present

## 2017-04-15 DIAGNOSIS — D539 Nutritional anemia, unspecified: Secondary | ICD-10-CM | POA: Diagnosis not present

## 2017-04-15 DIAGNOSIS — I10 Essential (primary) hypertension: Secondary | ICD-10-CM | POA: Diagnosis not present

## 2017-04-15 DIAGNOSIS — E039 Hypothyroidism, unspecified: Secondary | ICD-10-CM | POA: Diagnosis not present

## 2017-04-15 DIAGNOSIS — R8290 Unspecified abnormal findings in urine: Secondary | ICD-10-CM | POA: Diagnosis not present

## 2017-04-15 DIAGNOSIS — Z79899 Other long term (current) drug therapy: Secondary | ICD-10-CM | POA: Diagnosis not present

## 2017-04-22 DIAGNOSIS — M1711 Unilateral primary osteoarthritis, right knee: Secondary | ICD-10-CM | POA: Diagnosis not present

## 2017-05-07 DIAGNOSIS — Z780 Asymptomatic menopausal state: Secondary | ICD-10-CM | POA: Diagnosis not present

## 2017-05-08 DIAGNOSIS — M1711 Unilateral primary osteoarthritis, right knee: Secondary | ICD-10-CM | POA: Diagnosis not present

## 2017-05-20 ENCOUNTER — Other Ambulatory Visit: Payer: Self-pay | Admitting: Family Medicine

## 2017-05-20 DIAGNOSIS — N6320 Unspecified lump in the left breast, unspecified quadrant: Secondary | ICD-10-CM

## 2017-05-20 DIAGNOSIS — Z0000 Encounter for general adult medical examination without abnormal findings: Secondary | ICD-10-CM | POA: Diagnosis not present

## 2017-05-20 DIAGNOSIS — Z01419 Encounter for gynecological examination (general) (routine) without abnormal findings: Secondary | ICD-10-CM | POA: Diagnosis not present

## 2017-05-20 DIAGNOSIS — R0602 Shortness of breath: Secondary | ICD-10-CM | POA: Diagnosis not present

## 2017-05-20 DIAGNOSIS — I10 Essential (primary) hypertension: Secondary | ICD-10-CM | POA: Diagnosis not present

## 2017-05-20 DIAGNOSIS — Z79899 Other long term (current) drug therapy: Secondary | ICD-10-CM | POA: Diagnosis not present

## 2017-05-20 DIAGNOSIS — E1165 Type 2 diabetes mellitus with hyperglycemia: Secondary | ICD-10-CM | POA: Diagnosis not present

## 2017-05-20 DIAGNOSIS — M1710 Unilateral primary osteoarthritis, unspecified knee: Secondary | ICD-10-CM | POA: Diagnosis not present

## 2017-05-21 DIAGNOSIS — I351 Nonrheumatic aortic (valve) insufficiency: Secondary | ICD-10-CM | POA: Diagnosis not present

## 2017-05-26 ENCOUNTER — Ambulatory Visit
Admission: RE | Admit: 2017-05-26 | Discharge: 2017-05-26 | Disposition: A | Discharge status: Home / Self Care | Payer: Medicare Other | Source: Ambulatory Visit | Attending: Family Medicine | Admitting: Family Medicine

## 2017-05-26 ENCOUNTER — Ambulatory Visit: Payer: Medicare Other

## 2017-05-26 DIAGNOSIS — N6320 Unspecified lump in the left breast, unspecified quadrant: Secondary | ICD-10-CM

## 2017-05-26 DIAGNOSIS — R928 Other abnormal and inconclusive findings on diagnostic imaging of breast: Secondary | ICD-10-CM | POA: Diagnosis not present

## 2017-05-27 DIAGNOSIS — Z122 Encounter for screening for malignant neoplasm of respiratory organs: Secondary | ICD-10-CM | POA: Diagnosis not present

## 2017-05-27 DIAGNOSIS — R3129 Other microscopic hematuria: Secondary | ICD-10-CM | POA: Diagnosis not present

## 2017-05-27 DIAGNOSIS — Z87891 Personal history of nicotine dependence: Secondary | ICD-10-CM | POA: Diagnosis not present

## 2017-05-28 DIAGNOSIS — R9431 Abnormal electrocardiogram [ECG] [EKG]: Secondary | ICD-10-CM | POA: Diagnosis not present

## 2017-05-28 DIAGNOSIS — I10 Essential (primary) hypertension: Secondary | ICD-10-CM | POA: Diagnosis not present

## 2017-05-28 DIAGNOSIS — E7800 Pure hypercholesterolemia, unspecified: Secondary | ICD-10-CM | POA: Diagnosis not present

## 2017-05-28 DIAGNOSIS — I351 Nonrheumatic aortic (valve) insufficiency: Secondary | ICD-10-CM | POA: Diagnosis not present

## 2017-06-04 DIAGNOSIS — R0602 Shortness of breath: Secondary | ICD-10-CM | POA: Diagnosis not present

## 2017-06-04 DIAGNOSIS — R0989 Other specified symptoms and signs involving the circulatory and respiratory systems: Secondary | ICD-10-CM | POA: Diagnosis not present

## 2017-06-04 DIAGNOSIS — M79606 Pain in leg, unspecified: Secondary | ICD-10-CM | POA: Diagnosis not present

## 2017-06-09 DIAGNOSIS — H40003 Preglaucoma, unspecified, bilateral: Secondary | ICD-10-CM | POA: Diagnosis not present

## 2017-06-18 DIAGNOSIS — E1165 Type 2 diabetes mellitus with hyperglycemia: Secondary | ICD-10-CM | POA: Diagnosis not present

## 2017-06-18 DIAGNOSIS — J209 Acute bronchitis, unspecified: Secondary | ICD-10-CM | POA: Diagnosis not present

## 2017-06-18 DIAGNOSIS — Z79899 Other long term (current) drug therapy: Secondary | ICD-10-CM | POA: Diagnosis not present

## 2017-06-18 DIAGNOSIS — M1710 Unilateral primary osteoarthritis, unspecified knee: Secondary | ICD-10-CM | POA: Diagnosis not present

## 2017-06-18 DIAGNOSIS — I10 Essential (primary) hypertension: Secondary | ICD-10-CM | POA: Diagnosis not present

## 2017-06-19 DIAGNOSIS — R0989 Other specified symptoms and signs involving the circulatory and respiratory systems: Secondary | ICD-10-CM | POA: Diagnosis not present

## 2017-06-19 DIAGNOSIS — M79606 Pain in leg, unspecified: Secondary | ICD-10-CM | POA: Diagnosis not present

## 2017-06-19 DIAGNOSIS — R0602 Shortness of breath: Secondary | ICD-10-CM | POA: Diagnosis not present

## 2017-06-25 DIAGNOSIS — M79606 Pain in leg, unspecified: Secondary | ICD-10-CM | POA: Diagnosis not present

## 2017-06-25 DIAGNOSIS — I517 Cardiomegaly: Secondary | ICD-10-CM | POA: Diagnosis not present

## 2017-06-25 DIAGNOSIS — R0602 Shortness of breath: Secondary | ICD-10-CM | POA: Diagnosis not present

## 2017-06-25 DIAGNOSIS — I6523 Occlusion and stenosis of bilateral carotid arteries: Secondary | ICD-10-CM | POA: Diagnosis not present

## 2017-06-25 DIAGNOSIS — E119 Type 2 diabetes mellitus without complications: Secondary | ICD-10-CM | POA: Diagnosis not present

## 2017-06-30 DIAGNOSIS — M1710 Unilateral primary osteoarthritis, unspecified knee: Secondary | ICD-10-CM | POA: Diagnosis not present

## 2017-06-30 DIAGNOSIS — E1165 Type 2 diabetes mellitus with hyperglycemia: Secondary | ICD-10-CM | POA: Diagnosis not present

## 2017-06-30 DIAGNOSIS — Z79899 Other long term (current) drug therapy: Secondary | ICD-10-CM | POA: Diagnosis not present

## 2017-06-30 DIAGNOSIS — J209 Acute bronchitis, unspecified: Secondary | ICD-10-CM | POA: Diagnosis not present

## 2017-06-30 DIAGNOSIS — I10 Essential (primary) hypertension: Secondary | ICD-10-CM | POA: Diagnosis not present

## 2017-06-30 DIAGNOSIS — R892 Abnormal level of other drugs, medicaments and biological substances in specimens from other organs, systems and tissues: Secondary | ICD-10-CM | POA: Diagnosis not present

## 2017-07-08 DIAGNOSIS — K430 Incisional hernia with obstruction, without gangrene: Secondary | ICD-10-CM | POA: Diagnosis not present

## 2017-07-08 DIAGNOSIS — E669 Obesity, unspecified: Secondary | ICD-10-CM | POA: Insufficient documentation

## 2017-07-13 DIAGNOSIS — E041 Nontoxic single thyroid nodule: Secondary | ICD-10-CM | POA: Insufficient documentation

## 2017-07-16 DIAGNOSIS — M1710 Unilateral primary osteoarthritis, unspecified knee: Secondary | ICD-10-CM | POA: Diagnosis not present

## 2017-07-16 DIAGNOSIS — I10 Essential (primary) hypertension: Secondary | ICD-10-CM | POA: Diagnosis not present

## 2017-07-16 DIAGNOSIS — E1165 Type 2 diabetes mellitus with hyperglycemia: Secondary | ICD-10-CM | POA: Diagnosis not present

## 2017-07-16 DIAGNOSIS — E7800 Pure hypercholesterolemia, unspecified: Secondary | ICD-10-CM | POA: Diagnosis not present

## 2017-07-16 DIAGNOSIS — Z79899 Other long term (current) drug therapy: Secondary | ICD-10-CM | POA: Diagnosis not present

## 2017-07-28 DIAGNOSIS — E041 Nontoxic single thyroid nodule: Secondary | ICD-10-CM | POA: Diagnosis not present

## 2017-08-14 DIAGNOSIS — E039 Hypothyroidism, unspecified: Secondary | ICD-10-CM | POA: Diagnosis not present

## 2017-08-14 DIAGNOSIS — I10 Essential (primary) hypertension: Secondary | ICD-10-CM | POA: Diagnosis not present

## 2017-08-14 DIAGNOSIS — D539 Nutritional anemia, unspecified: Secondary | ICD-10-CM | POA: Diagnosis not present

## 2017-08-14 DIAGNOSIS — E7800 Pure hypercholesterolemia, unspecified: Secondary | ICD-10-CM | POA: Diagnosis not present

## 2017-08-14 DIAGNOSIS — Z79899 Other long term (current) drug therapy: Secondary | ICD-10-CM | POA: Diagnosis not present

## 2017-08-14 DIAGNOSIS — E1165 Type 2 diabetes mellitus with hyperglycemia: Secondary | ICD-10-CM | POA: Diagnosis not present

## 2017-08-25 DIAGNOSIS — K430 Incisional hernia with obstruction, without gangrene: Secondary | ICD-10-CM | POA: Diagnosis not present

## 2017-08-25 DIAGNOSIS — R109 Unspecified abdominal pain: Secondary | ICD-10-CM | POA: Diagnosis not present

## 2017-09-09 DIAGNOSIS — K430 Incisional hernia with obstruction, without gangrene: Secondary | ICD-10-CM | POA: Diagnosis not present

## 2017-09-11 DIAGNOSIS — Z79899 Other long term (current) drug therapy: Secondary | ICD-10-CM | POA: Diagnosis not present

## 2017-09-11 DIAGNOSIS — I10 Essential (primary) hypertension: Secondary | ICD-10-CM | POA: Diagnosis not present

## 2017-09-11 DIAGNOSIS — E1165 Type 2 diabetes mellitus with hyperglycemia: Secondary | ICD-10-CM | POA: Diagnosis not present

## 2017-09-11 DIAGNOSIS — M1710 Unilateral primary osteoarthritis, unspecified knee: Secondary | ICD-10-CM | POA: Diagnosis not present

## 2017-09-11 DIAGNOSIS — D539 Nutritional anemia, unspecified: Secondary | ICD-10-CM | POA: Diagnosis not present

## 2017-09-30 ENCOUNTER — Other Ambulatory Visit: Payer: Self-pay | Admitting: Family Medicine

## 2017-09-30 DIAGNOSIS — Z1231 Encounter for screening mammogram for malignant neoplasm of breast: Secondary | ICD-10-CM

## 2017-10-13 ENCOUNTER — Other Ambulatory Visit: Payer: Self-pay

## 2017-10-13 DIAGNOSIS — Z79899 Other long term (current) drug therapy: Secondary | ICD-10-CM | POA: Diagnosis not present

## 2017-10-13 DIAGNOSIS — E049 Nontoxic goiter, unspecified: Secondary | ICD-10-CM | POA: Diagnosis not present

## 2017-10-13 DIAGNOSIS — M1712 Unilateral primary osteoarthritis, left knee: Secondary | ICD-10-CM | POA: Diagnosis not present

## 2017-10-13 DIAGNOSIS — M1711 Unilateral primary osteoarthritis, right knee: Secondary | ICD-10-CM | POA: Diagnosis not present

## 2017-10-13 DIAGNOSIS — E1165 Type 2 diabetes mellitus with hyperglycemia: Secondary | ICD-10-CM | POA: Diagnosis not present

## 2017-10-13 NOTE — Patient Outreach (Signed)
[facility]) Care Management  10/13/2017  Paula Hall Nov 03, 1960 [id number]   Medication Adherence call to Paula Hall patient did not answer patient is due on Metformin 500 mg. [facility] said patient pick up on 10/13/17 for a 90 days supply.  Paula Hall is showing past due under [place].   [place] Management Direct Dial [phone]  Fax [phone] [email or web address]

## 2017-11-10 ENCOUNTER — Ambulatory Visit
Admission: RE | Admit: 2017-11-10 | Discharge: 2017-11-10 | Disposition: A | Discharge status: Home / Self Care | Payer: Medicare Other | Source: Ambulatory Visit | Attending: Family Medicine | Admitting: Family Medicine

## 2017-11-10 DIAGNOSIS — Z1231 Encounter for screening mammogram for malignant neoplasm of breast: Secondary | ICD-10-CM | POA: Diagnosis not present

## 2017-11-12 DIAGNOSIS — M1710 Unilateral primary osteoarthritis, unspecified knee: Secondary | ICD-10-CM | POA: Diagnosis not present

## 2017-11-12 DIAGNOSIS — Z79899 Other long term (current) drug therapy: Secondary | ICD-10-CM | POA: Diagnosis not present

## 2017-11-12 DIAGNOSIS — E7800 Pure hypercholesterolemia, unspecified: Secondary | ICD-10-CM | POA: Diagnosis not present

## 2017-11-12 DIAGNOSIS — I10 Essential (primary) hypertension: Secondary | ICD-10-CM | POA: Diagnosis not present

## 2017-11-12 DIAGNOSIS — E1165 Type 2 diabetes mellitus with hyperglycemia: Secondary | ICD-10-CM | POA: Diagnosis not present

## 2017-12-01 DIAGNOSIS — M25661 Stiffness of right knee, not elsewhere classified: Secondary | ICD-10-CM | POA: Diagnosis not present

## 2017-12-01 DIAGNOSIS — M25562 Pain in left knee: Secondary | ICD-10-CM | POA: Diagnosis not present

## 2017-12-01 DIAGNOSIS — M25561 Pain in right knee: Secondary | ICD-10-CM | POA: Diagnosis not present

## 2017-12-01 DIAGNOSIS — M25662 Stiffness of left knee, not elsewhere classified: Secondary | ICD-10-CM | POA: Diagnosis not present

## 2017-12-07 DIAGNOSIS — M25561 Pain in right knee: Secondary | ICD-10-CM | POA: Diagnosis not present

## 2017-12-07 DIAGNOSIS — M25662 Stiffness of left knee, not elsewhere classified: Secondary | ICD-10-CM | POA: Diagnosis not present

## 2017-12-07 DIAGNOSIS — M25562 Pain in left knee: Secondary | ICD-10-CM | POA: Diagnosis not present

## 2017-12-07 DIAGNOSIS — M25661 Stiffness of right knee, not elsewhere classified: Secondary | ICD-10-CM | POA: Diagnosis not present

## 2017-12-10 DIAGNOSIS — E039 Hypothyroidism, unspecified: Secondary | ICD-10-CM | POA: Diagnosis not present

## 2017-12-10 DIAGNOSIS — I10 Essential (primary) hypertension: Secondary | ICD-10-CM | POA: Diagnosis not present

## 2017-12-10 DIAGNOSIS — Z79899 Other long term (current) drug therapy: Secondary | ICD-10-CM | POA: Diagnosis not present

## 2017-12-10 DIAGNOSIS — E1165 Type 2 diabetes mellitus with hyperglycemia: Secondary | ICD-10-CM | POA: Diagnosis not present

## 2017-12-10 DIAGNOSIS — D539 Nutritional anemia, unspecified: Secondary | ICD-10-CM | POA: Diagnosis not present

## 2017-12-10 DIAGNOSIS — E7800 Pure hypercholesterolemia, unspecified: Secondary | ICD-10-CM | POA: Diagnosis not present

## 2017-12-14 DIAGNOSIS — M25562 Pain in left knee: Secondary | ICD-10-CM | POA: Diagnosis not present

## 2017-12-14 DIAGNOSIS — M25661 Stiffness of right knee, not elsewhere classified: Secondary | ICD-10-CM | POA: Diagnosis not present

## 2017-12-14 DIAGNOSIS — M25662 Stiffness of left knee, not elsewhere classified: Secondary | ICD-10-CM | POA: Diagnosis not present

## 2017-12-14 DIAGNOSIS — M25561 Pain in right knee: Secondary | ICD-10-CM | POA: Diagnosis not present

## 2017-12-16 DIAGNOSIS — K430 Incisional hernia with obstruction, without gangrene: Secondary | ICD-10-CM | POA: Diagnosis not present

## 2017-12-21 DIAGNOSIS — H40003 Preglaucoma, unspecified, bilateral: Secondary | ICD-10-CM | POA: Diagnosis not present

## 2017-12-22 DIAGNOSIS — M25662 Stiffness of left knee, not elsewhere classified: Secondary | ICD-10-CM | POA: Diagnosis not present

## 2017-12-22 DIAGNOSIS — M25562 Pain in left knee: Secondary | ICD-10-CM | POA: Diagnosis not present

## 2017-12-22 DIAGNOSIS — M25661 Stiffness of right knee, not elsewhere classified: Secondary | ICD-10-CM | POA: Diagnosis not present

## 2017-12-22 DIAGNOSIS — M25561 Pain in right knee: Secondary | ICD-10-CM | POA: Diagnosis not present

## 2017-12-31 ENCOUNTER — Ambulatory Visit (HOSPITAL_COMMUNITY): Payer: Medicare Other | Admitting: Psychiatry

## 2018-01-01 DIAGNOSIS — D509 Iron deficiency anemia, unspecified: Secondary | ICD-10-CM | POA: Diagnosis not present

## 2018-01-05 ENCOUNTER — Other Ambulatory Visit: Payer: Self-pay

## 2018-01-05 NOTE — Patient Outreach (Signed)
[facility]) Care Management  01/05/2018  Paula Hall [birth year] [id number]   Medication Adherence call to Mrs. Paula Hall left a message for patient to call back patient is due on Rosuvastatin 10 mg. Mrs. Paula Hall is showing past due under [place].  [place] Management Direct Dial [phone]  Fax [phone] [email or web address]

## 2018-01-08 DIAGNOSIS — E1165 Type 2 diabetes mellitus with hyperglycemia: Secondary | ICD-10-CM | POA: Diagnosis not present

## 2018-01-08 DIAGNOSIS — M1710 Unilateral primary osteoarthritis, unspecified knee: Secondary | ICD-10-CM | POA: Diagnosis not present

## 2018-01-08 DIAGNOSIS — E7800 Pure hypercholesterolemia, unspecified: Secondary | ICD-10-CM | POA: Diagnosis not present

## 2018-01-08 DIAGNOSIS — I10 Essential (primary) hypertension: Secondary | ICD-10-CM | POA: Diagnosis not present

## 2018-01-08 DIAGNOSIS — Z79899 Other long term (current) drug therapy: Secondary | ICD-10-CM | POA: Diagnosis not present

## 2018-01-08 DIAGNOSIS — D539 Nutritional anemia, unspecified: Secondary | ICD-10-CM | POA: Diagnosis not present

## 2018-01-21 DIAGNOSIS — R892 Abnormal level of other drugs, medicaments and biological substances in specimens from other organs, systems and tissues: Secondary | ICD-10-CM | POA: Diagnosis not present

## 2018-01-21 DIAGNOSIS — Z79899 Other long term (current) drug therapy: Secondary | ICD-10-CM | POA: Diagnosis not present

## 2018-01-27 DIAGNOSIS — K635 Polyp of colon: Secondary | ICD-10-CM | POA: Diagnosis not present

## 2018-01-27 DIAGNOSIS — K2270 Barrett's esophagus without dysplasia: Secondary | ICD-10-CM | POA: Diagnosis not present

## 2018-01-27 DIAGNOSIS — Z01818 Encounter for other preprocedural examination: Secondary | ICD-10-CM | POA: Diagnosis not present

## 2018-01-27 DIAGNOSIS — D509 Iron deficiency anemia, unspecified: Secondary | ICD-10-CM | POA: Diagnosis not present

## 2018-01-27 DIAGNOSIS — E119 Type 2 diabetes mellitus without complications: Secondary | ICD-10-CM | POA: Diagnosis not present

## 2018-02-03 DIAGNOSIS — I10 Essential (primary) hypertension: Secondary | ICD-10-CM | POA: Diagnosis not present

## 2018-02-03 DIAGNOSIS — E1165 Type 2 diabetes mellitus with hyperglycemia: Secondary | ICD-10-CM | POA: Diagnosis not present

## 2018-02-03 DIAGNOSIS — M1710 Unilateral primary osteoarthritis, unspecified knee: Secondary | ICD-10-CM | POA: Diagnosis not present

## 2018-02-03 DIAGNOSIS — E7800 Pure hypercholesterolemia, unspecified: Secondary | ICD-10-CM | POA: Diagnosis not present

## 2018-02-03 DIAGNOSIS — Z23 Encounter for immunization: Secondary | ICD-10-CM | POA: Diagnosis not present

## 2018-02-03 DIAGNOSIS — Z79899 Other long term (current) drug therapy: Secondary | ICD-10-CM | POA: Diagnosis not present

## 2018-02-10 DIAGNOSIS — K635 Polyp of colon: Secondary | ICD-10-CM | POA: Diagnosis not present

## 2018-02-10 DIAGNOSIS — K2270 Barrett's esophagus without dysplasia: Secondary | ICD-10-CM | POA: Diagnosis not present

## 2018-03-01 ENCOUNTER — Ambulatory Visit (INDEPENDENT_AMBULATORY_CARE_PROVIDER_SITE_OTHER): Payer: Medicare Other | Admitting: Psychiatry

## 2018-03-01 ENCOUNTER — Encounter (HOSPITAL_COMMUNITY): Payer: Self-pay | Admitting: Psychiatry

## 2018-03-01 VITALS — BP 142/88 | HR 64 | Ht 59.0 in | Wt 164.0 lb

## 2018-03-01 DIAGNOSIS — G4700 Insomnia, unspecified: Secondary | ICD-10-CM | POA: Diagnosis not present

## 2018-03-01 DIAGNOSIS — R2681 Unsteadiness on feet: Secondary | ICD-10-CM

## 2018-03-01 DIAGNOSIS — F411 Generalized anxiety disorder: Secondary | ICD-10-CM | POA: Diagnosis not present

## 2018-03-01 DIAGNOSIS — M2550 Pain in unspecified joint: Secondary | ICD-10-CM

## 2018-03-01 DIAGNOSIS — F319 Bipolar disorder, unspecified: Secondary | ICD-10-CM

## 2018-03-01 DIAGNOSIS — F17210 Nicotine dependence, cigarettes, uncomplicated: Secondary | ICD-10-CM

## 2018-03-01 DIAGNOSIS — R413 Other amnesia: Secondary | ICD-10-CM

## 2018-03-01 MED ORDER — LURASIDONE HCL 60 MG PO TABS: 60.0000 mg | ORAL_TABLET | Freq: Every day | ORAL | 0 refills | Status: DC

## 2018-03-01 MED ORDER — CLONAZEPAM 0.5 MG PO TABS: 0.5000 mg | ORAL_TABLET | Freq: Every day | ORAL | 0 refills | Status: DC | PRN

## 2018-03-01 MED ORDER — HYDROXYZINE HCL 25 MG PO TABS: ORAL_TABLET | ORAL | 0 refills | Status: DC

## 2018-03-01 NOTE — Progress Notes (Signed)
Psychiatric Initial Adult Assessment   Patient Identification: Paula Hall MRN:  [id number] Date of Evaluation:  03/01/2018 Referral Source: Karis Juba, therapist  Chief Complaint:  I need a new provider.  Visit Diagnosis:    ICD-10-CM   1. Bipolar I disorder (HCC) F31.9 lurasidone 60 MG TABS  2. GAD (generalized anxiety disorder) F41.1 clonazePAM (KLONOPIN) 0.5 MG tablet    hydrOXYzine (ATARAX/VISTARIL) 25 MG tablet    History of Present Illness: Patient is 57 year old African-American, single, unemployed female who is referred from her therapist Ms. Colbert for seeking medication management.  Patient has a long history of psychiatric illness and she was getting treatment from Dr. Chrissie Noa Headen's office until her insurance no longer covered her psychiatrist services.  Patient told that she suffered from irritability, severe mood swing, anger, depression and anxiety most of her life.  Patient is 20 minutes late and she is a poor historian.  She do not remember the details very well.  She did not bring her list of medication.  She seems to be upset on her transportation which came very late to pick her up.  Patient told that she forgot her medication folder at home.  However she described that she struggle with mood most of her life.  She moved from [place] 2 years ago to live closer to her sister.  She remembered that she had at least 3 time hospitalized in [place] due to severe anger, mania, paranoia and depression.  As per chart she is taking Latuda 40 mg daily, Klonopin prescribed 1 mg twice a day however she only takes as needed and the last pill she took few weeks ago.  She still have few tablets remaining in the bottle.  She also takes hydroxyzine 25 mg 1 to 2 tablet as needed for insomnia.  Her current symptoms are irritability, poor sleep, anxiety, lack of interest in socialization, racing thought, difficulty remembering things, paranoia and mood swings.  She denies any  suicidal thoughts or homicidal thought.  She denies any nightmares, flashback or any OCD symptoms.  She started seeing therapist for the past 2 years when she moved from [place].  She lives by herself.  She never married and she has no children.  But she admitted that she has issues with her siblings and family member but she also consider they have been supportive.  Patient denies drinking or using any illegal substances.  She also endorsed multiple health issues including chronic pain, diabetes, hypertension and GERD.  Her appetite is okay.  Her energy level is fair.  She does not have any tremors shakes or any EPS.  Associated Signs/Symptoms: Depression Symptoms:  insomnia, psychomotor agitation, difficulty concentrating, impaired memory, anxiety, loss of energy/fatigue, disturbed sleep, (Hypo) Manic Symptoms:  Distractibility, Impulsivity, Irritable Mood, Labiality of Mood, Anxiety Symptoms:  Panic Symptoms, Social Anxiety, Psychotic Symptoms:  Paranoia, PTSD Symptoms: Negative  Past Psychiatric History: Patient had a history of psychiatric illness since her early age.  She has 3 psychiatric hospitalization at [facility] due to her severe mood swings, agitation, anger and suicidal thoughts.  She denies any history of suicidal attempt other than superficially cutting her wrist many years ago.  Her last psychiatric hospitalization was in 2015.  Patient had a history of paranoia and anxiety.  She did not recall her previous medication list however as per chart she has taken Ambien, Zoloft, Paxil.  She also really remember that she has taken Thorazine, Depakote, Risperdal, Xanax and olanzapine.  Previous  Psychotropic Medications: Yes   Substance Abuse History in the last 12 months:  No.  Consequences of Substance Abuse: Negative  Past Medical History:  Past Medical History:  Diagnosis Date  . Abdominal hernia   . Anemia   . Arthritis   . Asthma   . Complication of  anesthesia    woke up during procedure  . Depression   . Diabetes ([place])   . GERD (gastroesophageal reflux disease)   . Heart murmur   . Hypertension   . Hypothyroid   . Sleep apnea    does not wear CPAP  . Stroke [facility])    " mild stroke years ago"  . Vocal cord polyps    B/L    Past Surgical History:  Procedure Laterality Date  . BREAST BIOPSY    . BREAST SURGERY      left breast biopsy  . CYST EXCISION  2017   abdomen  . FOOT SURGERY    . GASTRIC BYPASS  2006  . HERNIA REPAIR  2015  . MICROLARYNGOSCOPY Right 04/08/2017   Procedure: MICROLARYNGOSCOPY, BILATERAL VOCAL CORD POLYP REMOVAL;  Surgeon: [name], MD;  Location: [place];  Service: ENT;  Laterality: Right;    Family Psychiatric History: Niece has mental illness.  Family History:  Family History  Problem Relation Age of Onset  . Kidney cancer Mother   . Other Father        murder  . Colon cancer Neg Hx   . Stomach cancer Neg Hx     Social History:   Social History   Socioeconomic History  . Marital status: Single    Spouse name: Not on file  . Number of children: Not on file  . Years of education: Not on file  . Highest education level: Not on file  Occupational History  . Occupation: retired  Scientific laboratory technician  . Financial resource strain: Not on file  . Food insecurity:    Worry: Not on file    Inability: Not on file  . Transportation needs:    Medical: Not on file    Non-medical: Not on file  Tobacco Use  . Smoking status: Current Every Day Smoker    Types: Cigarettes    Last attempt to quit: 04/07/2017    Years since quitting: 0.8  . Smokeless tobacco: Never Used  Substance and Sexual Activity  . Alcohol use: Yes    Comment: denies  . Drug use: No  . Sexual activity: Never    Birth control/protection: None  Lifestyle  . Physical activity:    Days per week: Not on file    Minutes per session: Not on file  . Stress: Not on file  Relationships  . Social connections:    Talks on phone:  Not on file    Gets together: Not on file    Attends religious service: Not on file    Active member of club or organization: Not on file    Attends meetings of clubs or organizations: Not on file    Relationship status: Not on file  Other Topics Concern  . Not on file  Social History Narrative  . Not on file    Additional Social History: Patient born and raised in [place].  She never married.  She is a high school graduate but currently on disability since 2012 due to psychiatric illness.  Most of her siblings lives in [place].  Her father is deceased.  Allergies:   Allergies  Allergen Reactions  . Shellfish Allergy Shortness Of Breath and Nausea And Vomiting  . Contrast Media [Iodinated Diagnostic Agents] Nausea And Vomiting  . Metrizamide Nausea And Vomiting    Metabolic Disorder Labs: Lab Results  Component Value Date   HGBA1C 5.7 (H) 04/08/2017   MPG 116.89 04/08/2017   No results found for: PROLACTIN No results found for: CHOL, TRIG, HDL, CHOLHDL, VLDL, LDLCALC   Current Medications: Current Outpatient Medications  Medication Sig Dispense Refill  . albuterol (PROVENTIL HFA;VENTOLIN HFA) 108 (90 Base) MCG/ACT inhaler Inhale 1-2 puffs into the lungs every 6 (six) hours as needed for wheezing or shortness of breath.     Marland Kitchen amLODipine (NORVASC) 10 MG tablet Take 10 mg by mouth daily.    Marland Kitchen ammonium lactate (AMLACTIN) 12 % cream Apply 1 g topically as needed for dry skin.     Marland Kitchen betamethasone dipropionate (DIPROLENE) 0.05 % cream Apply 1 application topically daily as needed (dry skin).     . clonazePAM (KLONOPIN) 0.5 MG tablet Take 1 tablet (0.5 mg total) by mouth daily as needed for anxiety. 30 tablet 0  . diphenhydrAMINE (BENADRYL) 25 MG tablet Take 50 mg by mouth daily as needed for itching.     . docusate sodium (COLACE) 100 MG capsule Take 100 mg by mouth 3 (three) times daily.     Marland Kitchen esomeprazole (NEXIUM) 40 MG capsule Take 40 mg by mouth daily.     . ferrous  gluconate (IRON 27) 240 (27 FE) MG tablet Take 240 mg by mouth daily.    . Fluticasone-Salmeterol (ADVAIR) 250-50 MCG/DOSE AEPB Inhale 1 puff into the lungs 2 (two) times daily as needed (shortness of breath).    . hydrOXYzine (ATARAX/VISTARIL) 25 MG tablet Take 1-2 tab at bed time 60 tablet 0  . Lancets (FREESTYLE) lancets 1 each by Other route as needed for other. Use as instructed    . levothyroxine (SYNTHROID, LEVOTHROID) 125 MCG tablet Take 125 mcg by mouth daily before breakfast.    . lurasidone 60 MG TABS Take 1 tablet (60 mg total) by mouth at bedtime. 30 tablet 0  . metFORMIN (GLUCOPHAGE) 500 MG tablet Take 500 mg by mouth 2 (two) times daily with a meal.     . simvastatin (ZOCOR) 20 MG tablet Take 20 mg by mouth every evening.    . baclofen (LIORESAL) 10 MG tablet Take 10 mg by mouth daily as needed for muscle spasms.     Marland Kitchen ibuprofen (ADVIL,MOTRIN) 800 MG tablet Take 800 mg by mouth daily as needed for headache or moderate pain.     . metoprolol tartrate (LOPRESSOR) 25 MG tablet Take 25 mg by mouth 2 (two) times daily.     . Multiple Vitamin (MULTIVITAMIN WITH MINERALS) TABS tablet Take 1 tablet by mouth daily.    . traMADol (ULTRAM) 50 MG tablet Take 50 mg by mouth 2 (two) times daily.     Current Facility-Administered Medications  Medication Dose Route Frequency Provider Last Rate Last Dose  . 0.9 %  sodium chloride infusion  500 mL Intravenous Continuous Ladene Artist, MD        Neurologic: Headache: No Seizure: No Paresthesias:No  Musculoskeletal: Strength & Muscle Tone: within normal limits Gait & Station: unsteady, Patient is limping and difficulty walking due to leg pain Patient leans: Right  Psychiatric Specialty Exam: Review of Systems  Musculoskeletal: Positive for joint pain.  Skin: Negative.   Psychiatric/Behavioral: Positive for memory loss. The patient has insomnia.  Blood pressure (!) 142/88, pulse 64, height 4\' 11"  (1.499 m), weight 164 lb (74.4 kg),  last menstrual period 08/17/2010, SpO2 97 %.Body mass index is 33.12 kg/m.  General Appearance: Casual  Eye Contact:  Fair  Speech:  Fast but coherent  Volume:  Increased  Mood:  Dysphoric and Irritable  Affect:  Labile  Thought Process:  Descriptions of Associations: Circumstantial  Orientation:  Full (Time, Place, and Person)  Thought Content:  Paranoid Ideation and Rumination  Suicidal Thoughts:  No  Homicidal Thoughts:  No  Memory:  Immediate;   Fair Recent;   Fair Remote;   Fair  Judgement:  Fair  Insight:  Fair  Psychomotor Activity:  Increased  Concentration:  Concentration: Fair and Attention Span: Fair  Recall:  [employer] of Knowledge:Fair  Language: Fair  Akathisia:  No  Handed:  Right  AIMS (if indicated):  0  Assets:  Desire for Improvement Housing Social Support  ADL's:  Intact  Cognition: Impaired,  Mild  Sleep: Fair    Treatment Plan Summary: Jaqueline Uber is 57 year old single, African-American female who came to establish her care in this office.  Came 20 minutes late and did not bring her medication list.  She has difficulty expressing her symptoms and providing detail as her memory is not as good.  She is also upset because transportation did not come on time.  I reviewed her medication from EMR and last lab work results.  We talked about adjusting her medication.  I recommended to try Latuda 60 mg to help her mood lability, insomnia, anxiety and paranoia.  She like to continue Klonopin to help her anxiety and I recommended to try 0.5 mg as needed for severe anxiety or nervousness.  I would also continue hydroxyzine 25 mg 1 to 2 tablet as needed for insomnia.  I encouraged to continue therapy with Ms. Colbert.  Patient promised that on her next appointment she will bring her folder that has her previous history and medications.  We discussed safety concerns at any time having active suicidal thoughts or homicidal thought and she need to call 911 or go to local  emergency room.  I recommended to call us back if she has any question, concern for feel worsening of the symptoms.  I will see her again in 3 to 4 weeks.   Kathlee Nations, MD 10/28/201910:20 AM

## 2018-03-08 DIAGNOSIS — Z79899 Other long term (current) drug therapy: Secondary | ICD-10-CM | POA: Diagnosis not present

## 2018-03-08 DIAGNOSIS — M1710 Unilateral primary osteoarthritis, unspecified knee: Secondary | ICD-10-CM | POA: Diagnosis not present

## 2018-03-08 DIAGNOSIS — I10 Essential (primary) hypertension: Secondary | ICD-10-CM | POA: Diagnosis not present

## 2018-03-08 DIAGNOSIS — E7800 Pure hypercholesterolemia, unspecified: Secondary | ICD-10-CM | POA: Diagnosis not present

## 2018-03-08 DIAGNOSIS — E1165 Type 2 diabetes mellitus with hyperglycemia: Secondary | ICD-10-CM | POA: Diagnosis not present

## 2018-03-22 DIAGNOSIS — R892 Abnormal level of other drugs, medicaments and biological substances in specimens from other organs, systems and tissues: Secondary | ICD-10-CM | POA: Diagnosis not present

## 2018-03-22 DIAGNOSIS — Z79899 Other long term (current) drug therapy: Secondary | ICD-10-CM | POA: Diagnosis not present

## 2018-04-07 ENCOUNTER — Ambulatory Visit (HOSPITAL_COMMUNITY): Payer: Self-pay | Admitting: Psychiatry

## 2018-04-07 DIAGNOSIS — E7800 Pure hypercholesterolemia, unspecified: Secondary | ICD-10-CM | POA: Diagnosis not present

## 2018-04-07 DIAGNOSIS — E1165 Type 2 diabetes mellitus with hyperglycemia: Secondary | ICD-10-CM | POA: Diagnosis not present

## 2018-04-07 DIAGNOSIS — M1710 Unilateral primary osteoarthritis, unspecified knee: Secondary | ICD-10-CM | POA: Diagnosis not present

## 2018-04-07 DIAGNOSIS — K430 Incisional hernia with obstruction, without gangrene: Secondary | ICD-10-CM | POA: Diagnosis not present

## 2018-04-07 DIAGNOSIS — I10 Essential (primary) hypertension: Secondary | ICD-10-CM | POA: Diagnosis not present

## 2018-04-07 DIAGNOSIS — Z79899 Other long term (current) drug therapy: Secondary | ICD-10-CM | POA: Diagnosis not present

## 2018-04-26 ENCOUNTER — Other Ambulatory Visit: Payer: Self-pay

## 2018-04-26 NOTE — Patient Outreach (Signed)
[facility]) Care Management  04/26/2018  Paula Hall [birth year] [id number]   Medication Adherence call to Paula Hall spoke with patient she is due on Metformin 500 mg and Rosuvastatin 10 mg patient does not need Rosuvastain 10 mg she ask if we can call [facility] an order Metformin, Lancet and alcohol pads. Paula Hall is showing past due under [place].   [facility] Management Direct Dial [phone]  Fax [phone] [email or web address]

## 2018-05-07 DIAGNOSIS — D539 Nutritional anemia, unspecified: Secondary | ICD-10-CM | POA: Diagnosis not present

## 2018-05-07 DIAGNOSIS — E7800 Pure hypercholesterolemia, unspecified: Secondary | ICD-10-CM | POA: Diagnosis not present

## 2018-05-07 DIAGNOSIS — I10 Essential (primary) hypertension: Secondary | ICD-10-CM | POA: Diagnosis not present

## 2018-05-07 DIAGNOSIS — E039 Hypothyroidism, unspecified: Secondary | ICD-10-CM | POA: Diagnosis not present

## 2018-05-07 DIAGNOSIS — Z0000 Encounter for general adult medical examination without abnormal findings: Secondary | ICD-10-CM | POA: Diagnosis not present

## 2018-05-07 DIAGNOSIS — R0602 Shortness of breath: Secondary | ICD-10-CM | POA: Diagnosis not present

## 2018-05-07 DIAGNOSIS — E1165 Type 2 diabetes mellitus with hyperglycemia: Secondary | ICD-10-CM | POA: Diagnosis not present

## 2018-05-07 DIAGNOSIS — Z79899 Other long term (current) drug therapy: Secondary | ICD-10-CM | POA: Diagnosis not present

## 2018-05-07 DIAGNOSIS — J440 Chronic obstructive pulmonary disease with acute lower respiratory infection: Secondary | ICD-10-CM | POA: Diagnosis not present

## 2018-05-26 DIAGNOSIS — I351 Nonrheumatic aortic (valve) insufficiency: Secondary | ICD-10-CM | POA: Diagnosis not present

## 2018-05-28 DIAGNOSIS — R911 Solitary pulmonary nodule: Secondary | ICD-10-CM | POA: Diagnosis not present

## 2018-06-02 ENCOUNTER — Ambulatory Visit (INDEPENDENT_AMBULATORY_CARE_PROVIDER_SITE_OTHER): Payer: Medicare Other | Admitting: Psychiatry

## 2018-06-02 ENCOUNTER — Encounter (HOSPITAL_COMMUNITY): Payer: Self-pay | Admitting: Psychiatry

## 2018-06-02 DIAGNOSIS — F319 Bipolar disorder, unspecified: Secondary | ICD-10-CM | POA: Diagnosis not present

## 2018-06-02 DIAGNOSIS — F411 Generalized anxiety disorder: Secondary | ICD-10-CM | POA: Diagnosis not present

## 2018-06-02 MED ORDER — HYDROXYZINE HCL 25 MG PO TABS: ORAL_TABLET | ORAL | 2 refills | Status: DC

## 2018-06-02 MED ORDER — CLONAZEPAM 0.5 MG PO TABS: 0.5000 mg | ORAL_TABLET | Freq: Every day | ORAL | 1 refills | Status: DC | PRN

## 2018-06-02 MED ORDER — LURASIDONE HCL 60 MG PO TABS: 60.0000 mg | ORAL_TABLET | Freq: Every day | ORAL | 2 refills | Status: DC

## 2018-06-02 NOTE — Progress Notes (Signed)
BH MD/PA/NP OP Progress Note  06/02/2018 3:12 PM Paula Hall  MRN:  [id number]  Chief Complaint: I like new medication but I am out of pills and I started to have mood swings and anger.  HPI: Paula Hall came for Paula follow-up appointment.  She is a 58 year old African-American single female who was seen first time on March 01, 2018.  She was referred from Ms. Colbert Paula therapist for medication management.  Patient had bipolar disorder and she felt that Paula symptoms are not under control.  She is a poor historian and is still struggle remembering things.  She recall Latuda and hydroxyzine did very well but she ran out and missed appointment.  She has been without medication for past few weeks and having poor sleep, irritability, highs and lows and anger issues.  She went [place] to visit Paula Hall who is 58 year old and have memory impairment.  Today again she did not brought a list of medication but able to recall some of the medication that she has been taking.  She like to go back on [place], Klonopin and hydroxyzine which she believes helped Paula a lot.  Patient told even Paula therapist noticed improvement in Paula mood.  Patient denies any hallucination but admitted paranoia and lack of trust on people.  She endorsed poor sleep, racing thoughts but denies any suicidal thoughts or any crying spells.  She recall no side effects from [place].  She denies drinking or using any illegal substances.  She lives by herself.  She see Ms. Colbert Paula therapist on a regular basis.  She admitted weight gain which she believe due to Paula thyroid problem.  Recently Paula levothyroxine dose adjusted a week ago and she is hoping weight loss.  She admitted easily tired, fatigue and lack of energy to do exercise.  She also have chronic pain in Paula legs.  She had [facility] 2 [facility].  Visit Diagnosis:    ICD-10-CM   1. Bipolar I disorder (HCC) F31.9 Lurasidone HCl 60 MG TABS  2. GAD  (generalized anxiety disorder) F41.1 hydrOXYzine (ATARAX/VISTARIL) 25 MG tablet    clonazePAM (KLONOPIN) 0.5 MG tablet    Past Psychiatric History: Reviewed. History of psychiatric illness since early age.  Multiple hospitalization at [facility].  No history of suicidal attempt.  History of superficially cutting Paula wrist.  Last psychiatric hospitalization in 2015.  History of paranoia, anxiety.  Tried Ambien, Zoloft, Paxil, Thorazine, Depakote, Risperdal, Xanax and olanzapine but do not recall the details.  Past Medical History:  Past Medical History:  Diagnosis Date  . Abdominal hernia   . Anemia   . Arthritis   . Asthma   . Complication of anesthesia    woke up during procedure  . Depression   . Diabetes ([place])   . GERD (gastroesophageal reflux disease)   . Heart murmur   . Hypertension   . Hypothyroid   . Sleep apnea    does not wear CPAP  . Stroke [facility])    " mild stroke years ago"  . Vocal cord polyps    B/L    Past Surgical History:  Procedure Laterality Date  . BREAST BIOPSY    . BREAST SURGERY      left breast biopsy  . CYST EXCISION  2017   abdomen  . FOOT SURGERY    . GASTRIC BYPASS  2006  . HERNIA REPAIR  2015  . MICROLARYNGOSCOPY Right 04/08/2017   Procedure: MICROLARYNGOSCOPY,  BILATERAL VOCAL CORD POLYP REMOVAL;  Surgeon: [name], MD;  Location: [place];  Service: ENT;  Laterality: Right;    Family Psychiatric History: Reviewed.  Family History:  Family History  Problem Relation Age of Onset  . Kidney cancer Hall   . Other Father        murder  . Colon cancer Neg Hx   . Stomach cancer Neg Hx     Social History:  Social History   Socioeconomic History  . Marital status: Single    Spouse name: Not on file  . Number of children: Not on file  . Years of education: Not on file  . Highest education level: Not on file  Occupational History  . Occupation: retired  Scientific laboratory technician  . Financial resource strain: Not on file  . Food  insecurity:    Worry: Not on file    Inability: Not on file  . Transportation needs:    Medical: Not on file    Non-medical: Not on file  Tobacco Use  . Smoking status: Current Every Day Smoker    Types: Cigarettes    Last attempt to quit: 04/07/2017    Years since quitting: 1.1  . Smokeless tobacco: Never Used  Substance and Sexual Activity  . Alcohol use: Yes    Comment: denies  . Drug use: No  . Sexual activity: Never    Birth control/protection: None  Lifestyle  . Physical activity:    Days per week: Not on file    Minutes per session: Not on file  . Stress: Not on file  Relationships  . Social connections:    Talks on phone: Not on file    Gets together: Not on file    Attends religious service: Not on file    Active member of club or organization: Not on file    Attends meetings of clubs or organizations: Not on file    Relationship status: Not on file  Other Topics Concern  . Not on file  Social History Narrative  . Not on file    Allergies:  Allergies  Allergen Reactions  . Shellfish Allergy Shortness Of Breath and Nausea And Vomiting  . Contrast Media [Iodinated Diagnostic Agents] Nausea And Vomiting  . Metrizamide Nausea And Vomiting    Metabolic Disorder Labs: Lab Results  Component Value Date   HGBA1C 5.7 (H) 04/08/2017   MPG 116.89 04/08/2017   No results found for: PROLACTIN No results found for: CHOL, TRIG, HDL, CHOLHDL, VLDL, LDLCALC No results found for: TSH  Therapeutic Level Labs: No results found for: LITHIUM No results found for: VALPROATE No components found for:  CBMZ  Current Medications: Current Outpatient Medications  Medication Sig Dispense Refill  . albuterol (PROVENTIL HFA;VENTOLIN HFA) 108 (90 Base) MCG/ACT inhaler Inhale 1-2 puffs into the lungs every 6 (six) hours as needed for wheezing or shortness of breath.     Marland Kitchen amLODipine (NORVASC) 10 MG tablet Take 10 mg by mouth daily.    Marland Kitchen ammonium lactate (AMLACTIN) 12 % cream  Apply 1 g topically as needed for dry skin.     . baclofen (LIORESAL) 10 MG tablet Take 10 mg by mouth daily as needed for muscle spasms.     . betamethasone dipropionate (DIPROLENE) 0.05 % cream Apply 1 application topically daily as needed (dry skin).     . clonazePAM (KLONOPIN) 0.5 MG tablet Take 1 tablet (0.5 mg total) by mouth daily as needed for anxiety. 30 tablet 0  .  diphenhydrAMINE (BENADRYL) 25 MG tablet Take 50 mg by mouth daily as needed for itching.     . docusate sodium (COLACE) 100 MG capsule Take 100 mg by mouth 3 (three) times daily.     Marland Kitchen esomeprazole (NEXIUM) 40 MG capsule Take 40 mg by mouth daily.     . ferrous gluconate (IRON 27) 240 (27 FE) MG tablet Take 240 mg by mouth daily.    . Fluticasone-Salmeterol (ADVAIR) 250-50 MCG/DOSE AEPB Inhale 1 puff into the lungs 2 (two) times daily as needed (shortness of breath).    . hydrOXYzine (ATARAX/VISTARIL) 25 MG tablet Take 1-2 tab at bed time 60 tablet 0  . Lancets (FREESTYLE) lancets 1 each by Other route as needed for other. Use as instructed    . levothyroxine (SYNTHROID, LEVOTHROID) 137 MCG tablet Take 137 mcg by mouth daily.    Marland Kitchen LINZESS 72 MCG capsule TAKE 1 CAPSULE BY MOUTH ONCE DAILY TAKE 30 MINUTES BEFORE THE FIRST MEAL    . metFORMIN (GLUCOPHAGE) 500 MG tablet Take 500 mg by mouth 2 (two) times daily with a meal.     . metoprolol tartrate (LOPRESSOR) 25 MG tablet Take 25 mg by mouth 2 (two) times daily.     . simvastatin (ZOCOR) 20 MG tablet Take 20 mg by mouth every evening.    Marland Kitchen HYDROcodone-acetaminophen (NORCO/VICODIN) 5-325 MG tablet TAKE 1 TABLET BY MOUTH 2 TO 3 TIMES DAILY AS NEEDED FOR SEVERE PAIN    . lurasidone 60 MG TABS Take 1 tablet (60 mg total) by mouth at bedtime. (Patient not taking: Reported on 06/02/2018) 30 tablet 0   Current Facility-Administered Medications  Medication Dose Route Frequency Provider Last Rate Last Dose  . 0.9 %  sodium chloride infusion  500 mL Intravenous Continuous Ladene Artist, MD         Musculoskeletal: Strength & Muscle Tone: within normal limits Gait & Station: normal Patient leans: difficulty walking  Psychiatric Specialty Exam: Review of Systems  Musculoskeletal: Positive for joint pain.    Blood pressure 132/78, height 4\' 10"  (1.473 m), weight 176 lb (79.8 kg), last menstrual period 08/17/2010.Body mass index is 36.78 kg/m.  General Appearance: Casual  Eye Contact:  Fair  Speech:  fast but clear  Volume:  Normal  Mood:  Dysphoric  Affect:  Labile  Thought Process:  Descriptions of Associations: Circumstantial  Orientation:  Full (Time, Place, and Person)  Thought Content: Paranoid Ideation and Rumination   Suicidal Thoughts:  No  Homicidal Thoughts:  No  Memory:  Immediate;   Fair Recent;   Fair Remote;   Fair  Judgement:  Fair  Insight:  Fair  Psychomotor Activity:  Increased  Concentration:  Concentration: Fair and Attention Span: Fair  Recall:  [employer] of Knowledge: Fair  Language: Good  Akathisia:  No  Handed:  Right  AIMS (if indicated): not done  Assets:  Communication Skills Desire for Improvement Housing  ADL's:  Intact  Cognition: Impaired,  Mild  Sleep:  improved   Screenings:   Assessment and Plan: Bipolar disorder type I.  Generalized anxiety disorder.  Discussed risk of noncompliance with medication causing the relapse.  Patient agreed especially she felt better with the medication.  Recommended to restart Latuda 60 mg at bedtime, Klonopin 0.5 mg as needed for severe anxiety and hydroxyzine 25 mg 1 to 2 tablet as needed for insomnia and anxiety.  Encouraged to keep appointment with Ms. Colbert for therapy.  We will also get  records from Paula primary care physician as patient recently had blood work results.  Discussed weight gain and encouraged to watch Paula calorie intake and try to do exercise 20 to 30 minutes 2-3 times a week.  Discussed healthy lifestyle.  Recommended to call us back if she has any question or  any concern.  Follow-up in 3 months.   Kathlee Nations, MD 06/02/2018, 3:12 PM

## 2018-06-04 DIAGNOSIS — I351 Nonrheumatic aortic (valve) insufficiency: Secondary | ICD-10-CM | POA: Diagnosis not present

## 2018-06-04 DIAGNOSIS — E7800 Pure hypercholesterolemia, unspecified: Secondary | ICD-10-CM | POA: Diagnosis not present

## 2018-06-04 DIAGNOSIS — R9431 Abnormal electrocardiogram [ECG] [EKG]: Secondary | ICD-10-CM | POA: Diagnosis not present

## 2018-06-04 DIAGNOSIS — I10 Essential (primary) hypertension: Secondary | ICD-10-CM | POA: Diagnosis not present

## 2018-06-07 DIAGNOSIS — E1165 Type 2 diabetes mellitus with hyperglycemia: Secondary | ICD-10-CM | POA: Diagnosis not present

## 2018-06-07 DIAGNOSIS — M1710 Unilateral primary osteoarthritis, unspecified knee: Secondary | ICD-10-CM | POA: Diagnosis not present

## 2018-06-07 DIAGNOSIS — Z0000 Encounter for general adult medical examination without abnormal findings: Secondary | ICD-10-CM | POA: Diagnosis not present

## 2018-06-07 DIAGNOSIS — Z79899 Other long term (current) drug therapy: Secondary | ICD-10-CM | POA: Diagnosis not present

## 2018-06-07 DIAGNOSIS — I10 Essential (primary) hypertension: Secondary | ICD-10-CM | POA: Diagnosis not present

## 2018-06-07 DIAGNOSIS — J189 Pneumonia, unspecified organism: Secondary | ICD-10-CM | POA: Diagnosis not present

## 2018-06-08 DIAGNOSIS — M1711 Unilateral primary osteoarthritis, right knee: Secondary | ICD-10-CM | POA: Diagnosis not present

## 2018-06-08 DIAGNOSIS — M25561 Pain in right knee: Secondary | ICD-10-CM | POA: Diagnosis not present

## 2018-07-06 DIAGNOSIS — E1165 Type 2 diabetes mellitus with hyperglycemia: Secondary | ICD-10-CM | POA: Diagnosis not present

## 2018-07-06 DIAGNOSIS — Z79899 Other long term (current) drug therapy: Secondary | ICD-10-CM | POA: Diagnosis not present

## 2018-07-06 DIAGNOSIS — M1710 Unilateral primary osteoarthritis, unspecified knee: Secondary | ICD-10-CM | POA: Diagnosis not present

## 2018-07-06 DIAGNOSIS — I10 Essential (primary) hypertension: Secondary | ICD-10-CM | POA: Diagnosis not present

## 2018-07-13 DIAGNOSIS — M25561 Pain in right knee: Secondary | ICD-10-CM | POA: Diagnosis not present

## 2018-07-13 DIAGNOSIS — M1712 Unilateral primary osteoarthritis, left knee: Secondary | ICD-10-CM | POA: Diagnosis not present

## 2018-07-13 DIAGNOSIS — M1711 Unilateral primary osteoarthritis, right knee: Secondary | ICD-10-CM | POA: Diagnosis not present

## 2018-07-13 DIAGNOSIS — M25562 Pain in left knee: Secondary | ICD-10-CM | POA: Diagnosis not present

## 2018-07-16 DIAGNOSIS — I6523 Occlusion and stenosis of bilateral carotid arteries: Secondary | ICD-10-CM | POA: Diagnosis not present

## 2018-07-16 DIAGNOSIS — I517 Cardiomegaly: Secondary | ICD-10-CM | POA: Diagnosis not present

## 2018-07-23 ENCOUNTER — Encounter: Payer: Self-pay | Admitting: Gastroenterology

## 2018-08-06 DIAGNOSIS — M1710 Unilateral primary osteoarthritis, unspecified knee: Secondary | ICD-10-CM | POA: Diagnosis not present

## 2018-08-06 DIAGNOSIS — I10 Essential (primary) hypertension: Secondary | ICD-10-CM | POA: Diagnosis not present

## 2018-08-06 DIAGNOSIS — Z79899 Other long term (current) drug therapy: Secondary | ICD-10-CM | POA: Diagnosis not present

## 2018-08-06 DIAGNOSIS — E7800 Pure hypercholesterolemia, unspecified: Secondary | ICD-10-CM | POA: Diagnosis not present

## 2018-08-06 DIAGNOSIS — E1165 Type 2 diabetes mellitus with hyperglycemia: Secondary | ICD-10-CM | POA: Diagnosis not present

## 2018-08-10 DIAGNOSIS — M1712 Unilateral primary osteoarthritis, left knee: Secondary | ICD-10-CM | POA: Diagnosis not present

## 2018-08-10 DIAGNOSIS — M1711 Unilateral primary osteoarthritis, right knee: Secondary | ICD-10-CM | POA: Diagnosis not present

## 2018-08-12 DIAGNOSIS — E049 Nontoxic goiter, unspecified: Secondary | ICD-10-CM | POA: Diagnosis not present

## 2018-08-26 ENCOUNTER — Other Ambulatory Visit: Payer: Self-pay

## 2018-08-26 NOTE — Patient Outreach (Signed)
[facility]) Care Management  08/26/2018  Paula Hall Feb 28, 1961 [id number]   Medication Adherence call to Paula Hall spoke with patient she is due on Metformin she is taking 1 tablet 2 times a day and needs more medication at this time patient said pharmacy only gave her a 15 days supply but normally she receives a 90 days supply. [facility] said patient did not have refill at the time but now they have a 90 days supply ready for patient . Paula Hall is showing past due under [place].  [facility] Management Direct Dial [phone]  Fax [phone] [email or web address]

## 2018-09-01 ENCOUNTER — Ambulatory Visit (HOSPITAL_COMMUNITY): Payer: Medicare Other | Admitting: Psychiatry

## 2018-09-03 DIAGNOSIS — E1165 Type 2 diabetes mellitus with hyperglycemia: Secondary | ICD-10-CM | POA: Diagnosis not present

## 2018-09-03 DIAGNOSIS — M1710 Unilateral primary osteoarthritis, unspecified knee: Secondary | ICD-10-CM | POA: Diagnosis not present

## 2018-09-03 DIAGNOSIS — Z79899 Other long term (current) drug therapy: Secondary | ICD-10-CM | POA: Diagnosis not present

## 2018-09-03 DIAGNOSIS — E7800 Pure hypercholesterolemia, unspecified: Secondary | ICD-10-CM | POA: Diagnosis not present

## 2018-09-03 DIAGNOSIS — I10 Essential (primary) hypertension: Secondary | ICD-10-CM | POA: Diagnosis not present

## 2018-09-03 DIAGNOSIS — E039 Hypothyroidism, unspecified: Secondary | ICD-10-CM | POA: Diagnosis not present

## 2018-09-03 DIAGNOSIS — D539 Nutritional anemia, unspecified: Secondary | ICD-10-CM | POA: Diagnosis not present

## 2018-09-07 DIAGNOSIS — M1711 Unilateral primary osteoarthritis, right knee: Secondary | ICD-10-CM | POA: Diagnosis not present

## 2018-09-08 ENCOUNTER — Encounter (HOSPITAL_COMMUNITY): Payer: Self-pay | Admitting: Psychiatry

## 2018-09-08 ENCOUNTER — Other Ambulatory Visit: Payer: Self-pay

## 2018-09-08 ENCOUNTER — Ambulatory Visit (HOSPITAL_COMMUNITY): Payer: Medicare Other | Admitting: Psychiatry

## 2018-09-08 ENCOUNTER — Ambulatory Visit (INDEPENDENT_AMBULATORY_CARE_PROVIDER_SITE_OTHER): Payer: Medicare Other | Admitting: Psychiatry

## 2018-09-08 DIAGNOSIS — F411 Generalized anxiety disorder: Secondary | ICD-10-CM | POA: Diagnosis not present

## 2018-09-08 DIAGNOSIS — F319 Bipolar disorder, unspecified: Secondary | ICD-10-CM | POA: Diagnosis not present

## 2018-09-08 MED ORDER — CLONAZEPAM 0.5 MG PO TABS: 0.5000 mg | ORAL_TABLET | Freq: Every day | ORAL | 0 refills | Status: DC | PRN

## 2018-09-08 MED ORDER — HYDROXYZINE HCL 25 MG PO TABS: ORAL_TABLET | ORAL | 2 refills | Status: DC

## 2018-09-08 MED ORDER — LURASIDONE HCL 60 MG PO TABS: 60.0000 mg | ORAL_TABLET | Freq: Every day | ORAL | 2 refills | Status: DC

## 2018-09-08 NOTE — Progress Notes (Signed)
Virtual Visit via Telephone Note  I connected with Paula Hall on 09/08/18 at  3:20 PM EDT by telephone and verified that I am speaking with the correct person using two identifiers.   I discussed the limitations, risks, security and privacy concerns of performing an evaluation and management service by telephone and the availability of in person appointments. I also discussed with the patient that there may be a patient responsible charge related to this service. The patient expressed understanding and agreed to proceed.   History of Present Illness: Patient is evaluated through phone session.  She is taking her medication most of the time.  She feels [place] working.  Sometimes she skips the dose as she forgets.  She takes also hydroxyzine as needed for sleep and clonazepam 0.5 mg as needed for severe anxiety.  She describes her mood is stable and denies any highs and lows.  Her 57 year old mother who lives in [place] now moved in with her due to COVID-19.  She denies any paranoia or any hallucination.  She reported no tremors or shakes.  She start walking 10 to 15 minutes a few times a week.  Since taking the Latuda she feel it is working.  She like to continue her current medication.  Past Psychiatric History: Reviewed. History of psychiatric illness since early age.  Multiple hospitalization at [facility].  No history of suicidal attempt.  History of superficially cutting her wrist.  Last psychiatric hospitalization in 2015.  History of paranoia, anxiety.  Tried Ambien, Zoloft, Paxil, Thorazine, Depakote, Risperdal, Xanax and olanzapine but do not recall the details.    Observations/Objective: Mental status examination done on the phone.  Patient describes her mood euthymic.  Her attention and concentration is fair.  Her speech is fast but clear.  She had difficulty remembering things but relevant in conversation.  Her thought process circumstantial.  She denies any auditory or visual  hallucination.  She denies any active or passive suicidal thoughts or homicidal thought.  There were no delusions or paranoia.  She is alert and oriented x3.  Her cognition is fair.  Her fund of knowledge is fair.  Her insight and judgment is okay.  She reported no tremors, shakes or any EPS.  Assessment and Plan: Bipolar disorder type I.  Generalized anxiety disorder.  Patient is a stable on her medications since taking the Latuda regularly.  Continue Latuda 60 mg at bedtime, clonazepam 0.5 mg as needed for severe anxiety and hydroxyzine 25 mg 1 to 2 tablet for insomnia.  Recommended to call us back if she has any question or any concern.  She is not seeing therapist at this time due to COVID-19.  Discussed medication side effects and benefits.  Follow-up in 3 months.  Follow Up Instructions:    I discussed the assessment and treatment plan with the patient. The patient was provided an opportunity to ask questions and all were answered. The patient agreed with the plan and demonstrated an understanding of the instructions.   The patient was advised to call back or seek an in-person evaluation if the symptoms worsen or if the condition fails to improve as anticipated.  I provided 15 minutes of non-face-to-face time during this encounter.   Kathlee Nations, MD

## 2018-09-14 DIAGNOSIS — M1711 Unilateral primary osteoarthritis, right knee: Secondary | ICD-10-CM | POA: Diagnosis not present

## 2018-09-15 DIAGNOSIS — R945 Abnormal results of liver function studies: Secondary | ICD-10-CM | POA: Diagnosis not present

## 2018-09-30 ENCOUNTER — Other Ambulatory Visit: Payer: Self-pay | Admitting: Family Medicine

## 2018-09-30 ENCOUNTER — Telehealth: Payer: Self-pay | Admitting: Gastroenterology

## 2018-09-30 DIAGNOSIS — Z1231 Encounter for screening mammogram for malignant neoplasm of breast: Secondary | ICD-10-CM

## 2018-09-30 NOTE — Telephone Encounter (Signed)
Pt called in and wanted to speak with the nurse. She stated that she believe that she is not due yet for a colon because she has had two and want to discuss this with the nurse.

## 2018-09-30 NOTE — Telephone Encounter (Signed)
Left message for patient to call back  

## 2018-09-30 NOTE — Telephone Encounter (Signed)
Patient reports that she had a colonoscopy and upper endoscopy last year due to "lost 4 pints of blood".  She will check and see if she can find the records and contact us back.

## 2018-10-01 DIAGNOSIS — M1711 Unilateral primary osteoarthritis, right knee: Secondary | ICD-10-CM | POA: Diagnosis not present

## 2018-10-04 DIAGNOSIS — Z1159 Encounter for screening for other viral diseases: Secondary | ICD-10-CM | POA: Diagnosis not present

## 2018-10-04 DIAGNOSIS — Z79899 Other long term (current) drug therapy: Secondary | ICD-10-CM | POA: Diagnosis not present

## 2018-10-04 DIAGNOSIS — M1710 Unilateral primary osteoarthritis, unspecified knee: Secondary | ICD-10-CM | POA: Diagnosis not present

## 2018-10-04 DIAGNOSIS — E7800 Pure hypercholesterolemia, unspecified: Secondary | ICD-10-CM | POA: Diagnosis not present

## 2018-10-04 DIAGNOSIS — E1165 Type 2 diabetes mellitus with hyperglycemia: Secondary | ICD-10-CM | POA: Diagnosis not present

## 2018-10-04 DIAGNOSIS — I10 Essential (primary) hypertension: Secondary | ICD-10-CM | POA: Diagnosis not present

## 2018-10-06 NOTE — Telephone Encounter (Signed)
Received colonoscopy report from [facility] from 01/27/2018.  No EGD records.  Stated recall in 3-5 years--will be sent to Dr. Fuller Plan for review.

## 2018-10-06 NOTE — Telephone Encounter (Signed)
I need the pathology report from that colonoscopy to determine timing of next colonoscopy.

## 2018-11-02 DIAGNOSIS — E7800 Pure hypercholesterolemia, unspecified: Secondary | ICD-10-CM | POA: Diagnosis not present

## 2018-11-02 DIAGNOSIS — E1165 Type 2 diabetes mellitus with hyperglycemia: Secondary | ICD-10-CM | POA: Diagnosis not present

## 2018-11-02 DIAGNOSIS — I10 Essential (primary) hypertension: Secondary | ICD-10-CM | POA: Diagnosis not present

## 2018-11-02 DIAGNOSIS — M1710 Unilateral primary osteoarthritis, unspecified knee: Secondary | ICD-10-CM | POA: Diagnosis not present

## 2018-11-02 DIAGNOSIS — Z79899 Other long term (current) drug therapy: Secondary | ICD-10-CM | POA: Diagnosis not present

## 2018-11-15 DIAGNOSIS — M1711 Unilateral primary osteoarthritis, right knee: Secondary | ICD-10-CM | POA: Diagnosis not present

## 2018-11-17 ENCOUNTER — Ambulatory Visit: Payer: Medicare Other

## 2018-11-22 DIAGNOSIS — M1711 Unilateral primary osteoarthritis, right knee: Secondary | ICD-10-CM | POA: Diagnosis not present

## 2018-11-29 DIAGNOSIS — M1711 Unilateral primary osteoarthritis, right knee: Secondary | ICD-10-CM | POA: Diagnosis not present

## 2018-11-29 DIAGNOSIS — M25561 Pain in right knee: Secondary | ICD-10-CM | POA: Diagnosis not present

## 2018-12-01 DIAGNOSIS — I10 Essential (primary) hypertension: Secondary | ICD-10-CM | POA: Diagnosis not present

## 2018-12-01 DIAGNOSIS — Z79899 Other long term (current) drug therapy: Secondary | ICD-10-CM | POA: Diagnosis not present

## 2018-12-01 DIAGNOSIS — E7800 Pure hypercholesterolemia, unspecified: Secondary | ICD-10-CM | POA: Diagnosis not present

## 2018-12-01 DIAGNOSIS — E1165 Type 2 diabetes mellitus with hyperglycemia: Secondary | ICD-10-CM | POA: Diagnosis not present

## 2018-12-01 DIAGNOSIS — M1710 Unilateral primary osteoarthritis, unspecified knee: Secondary | ICD-10-CM | POA: Diagnosis not present

## 2018-12-02 DIAGNOSIS — Z87891 Personal history of nicotine dependence: Secondary | ICD-10-CM | POA: Diagnosis not present

## 2018-12-02 DIAGNOSIS — Z122 Encounter for screening for malignant neoplasm of respiratory organs: Secondary | ICD-10-CM | POA: Diagnosis not present

## 2018-12-03 DIAGNOSIS — Z1159 Encounter for screening for other viral diseases: Secondary | ICD-10-CM | POA: Diagnosis not present

## 2018-12-08 ENCOUNTER — Encounter (HOSPITAL_COMMUNITY): Payer: Self-pay | Admitting: Psychiatry

## 2018-12-08 ENCOUNTER — Ambulatory Visit (INDEPENDENT_AMBULATORY_CARE_PROVIDER_SITE_OTHER): Payer: Medicare Other | Admitting: Psychiatry

## 2018-12-08 DIAGNOSIS — F319 Bipolar disorder, unspecified: Secondary | ICD-10-CM | POA: Diagnosis not present

## 2018-12-08 DIAGNOSIS — F411 Generalized anxiety disorder: Secondary | ICD-10-CM | POA: Diagnosis not present

## 2018-12-08 MED ORDER — QUETIAPINE FUMARATE 50 MG PO TABS: ORAL_TABLET | ORAL | 1 refills | Status: DC

## 2018-12-08 MED ORDER — HYDROXYZINE HCL 25 MG PO TABS: ORAL_TABLET | ORAL | 0 refills | Status: DC

## 2018-12-08 MED ORDER — CLONAZEPAM 0.5 MG PO TABS: 0.5000 mg | ORAL_TABLET | Freq: Every day | ORAL | 0 refills | Status: DC | PRN

## 2018-12-08 NOTE — Progress Notes (Signed)
Virtual Visit via Telephone Note  I connected with Paula Hall on 12/08/18 at  2:40 PM EDT by telephone and verified that I am speaking with the correct person using two identifiers.   I discussed the limitations, risks, security and privacy concerns of performing an evaluation and management service by telephone and the availability of in person appointments. I also discussed with the patient that there may be a patient responsible charge related to this service. The patient expressed understanding and agreed to proceed.   History of Present Illness: Patient evaluated by phone session.  She reported that she stopped taking Latuda because it is not working.  She appears upset because none of the medicine is working.  He takes Klonopin and hydroxyzine only as needed.  She still struggle with anxiety, irritability, poor sleep, mood swing and anger.  Recently she had a COVID test but she is pleased that it was negative.  She admitted paranoia since stopped the [place].  However she do not want to go back to [place].  On her last visit she told it was working but it is unclear why it is not working.  She denies any drug use.  She admitted recently seen Dr. on and off because of cough, breathing problems.  Past Psychiatric History:Reviewed. History of psychiatric illness since early age. Multiple inpatient at [facility]. No history of suicidal attempt. History of cutting her wrist. Last inpatient in 2015. History of paranoia, anxiety. Tried Ambien, Zoloft, Paxil, Thorazine, Depakote, Risperdal, Xanax and olanzapine but claims nothing worked.  Recently we tried [place] but stopped on her own.      Psychiatric Specialty Exam: Physical Exam  ROS  Last menstrual period 08/17/2010.There is no height or weight on file to calculate BMI.  General Appearance: NA  Eye Contact:  NA  Speech:  fast  Volume:  Increased  Mood:  Irritable  Affect:  NA  Thought Process:  Descriptions of Associations:  Circumstantial  Orientation:  Full (Time, Place, and Person)  Thought Content:  Paranoid Ideation  Suicidal Thoughts:  No  Homicidal Thoughts:  No  Memory:  Immediate;   Fair Recent;   Fair Remote;   Fair  Judgement:  Fair  Insight:  Fair  Psychomotor Activity:  NA  Concentration:  Concentration: Fair and Attention Span: Fair  Recall:  [employer] of Knowledge:  Fair  Language:  Good  Akathisia:  No  Handed:  Right  AIMS (if indicated):     Assets:  Communication Skills Desire for Improvement Housing Resilience  ADL's:  Intact  Cognition:  WNL  Sleep:         Assessment and Plan: Bipolar disorder type I.  Generalized anxiety disorder.  I do believe patient is slowly decompensating because she is not taking Latuda.  She does not want to go back to [facility] and now she agreed to try Seroquel.  She recalls Seroquel helped in the past.  We will start Seroquel 50-100 mg at bedtime.  Continue Klonopin as needed for anxiety.  She like to continue hydroxyzine also for anxiety.  She is not interested in therapy.  Recommended to call us back if she has any question or any concern.  Discussed safety concern that anytime having active suicidal thoughts or homicidal thought that she need to call 911 of the local insulin.  Follow-up in 2 months.  Follow Up Instructions:    I discussed the assessment and treatment plan with the patient. The patient was provided an opportunity  to ask questions and all were answered. The patient agreed with the plan and demonstrated an understanding of the instructions.   The patient was advised to call back or seek an in-person evaluation if the symptoms worsen or if the condition fails to improve as anticipated.  I provided 20 minutes of non-face-to-face time during this encounter.   Kathlee Nations, MD

## 2018-12-11 IMAGING — MG 2D DIGITAL DIAGNOSTIC UNILATERAL LEFT MAMMOGRAM WITH CAD AND ADJ
6 of 9 series · 6 of 21 positions shown · non-contrast
Comparison: Previous exam(s).

CLINICAL DATA: The patient has known masses in the lateral left
breast. She is felt no change. The patient began seeing a new doctor
recently who felt lumps in the lateral left breast which the patient
feels are chronic. She was sent for assessment.

EXAM:
2D DIGITAL DIAGNOSTIC UNILATERAL LEFT MAMMOGRAM WITH CAD AND ADJUNCT
TOMO

[L CC]
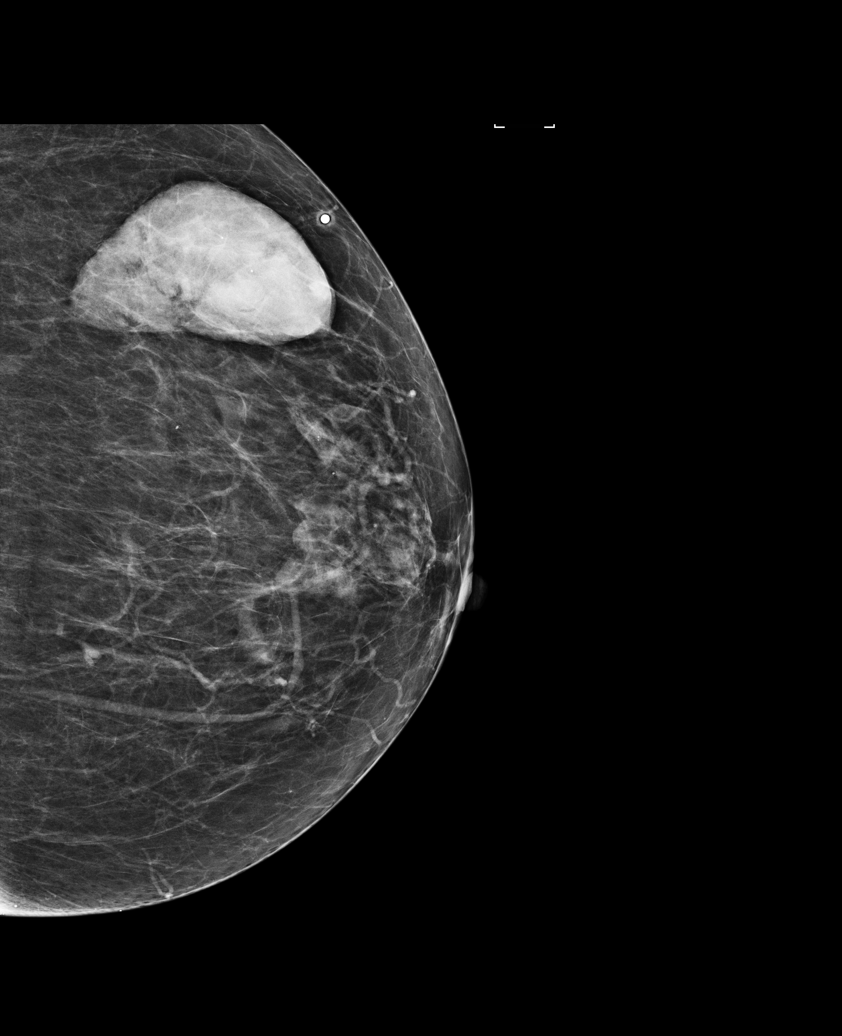

[L CC synth-2D]
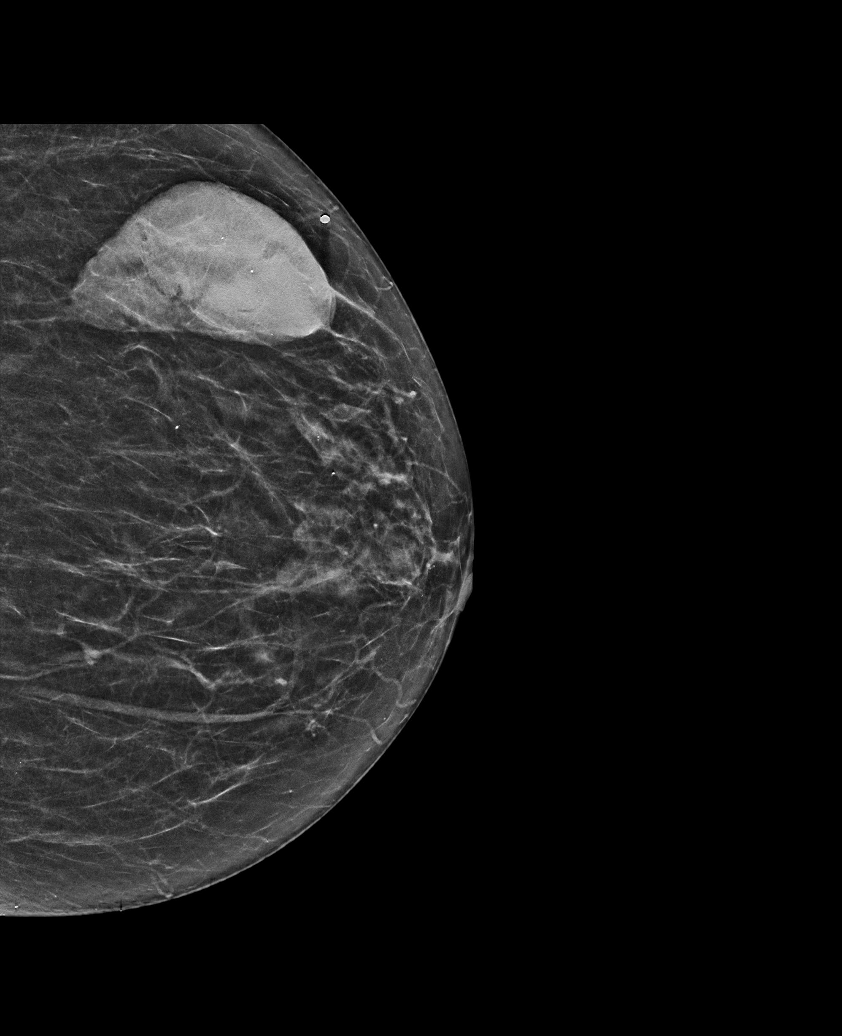

[L TAN synth-2D]
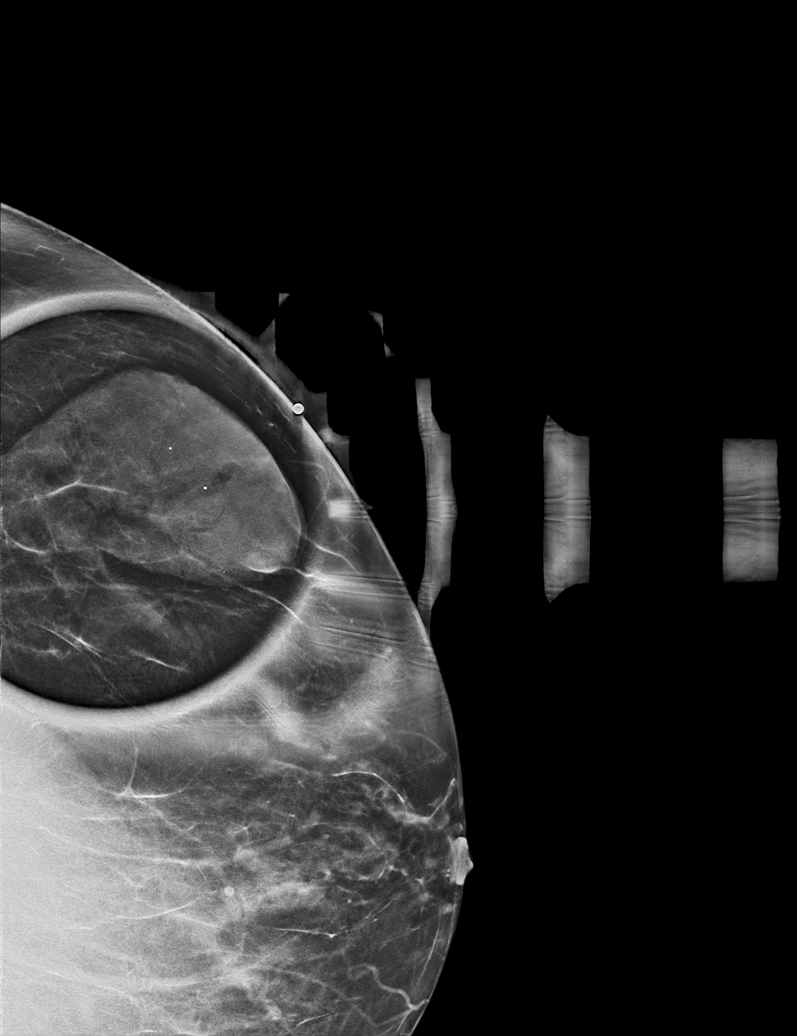

[L MLO synth-2D]
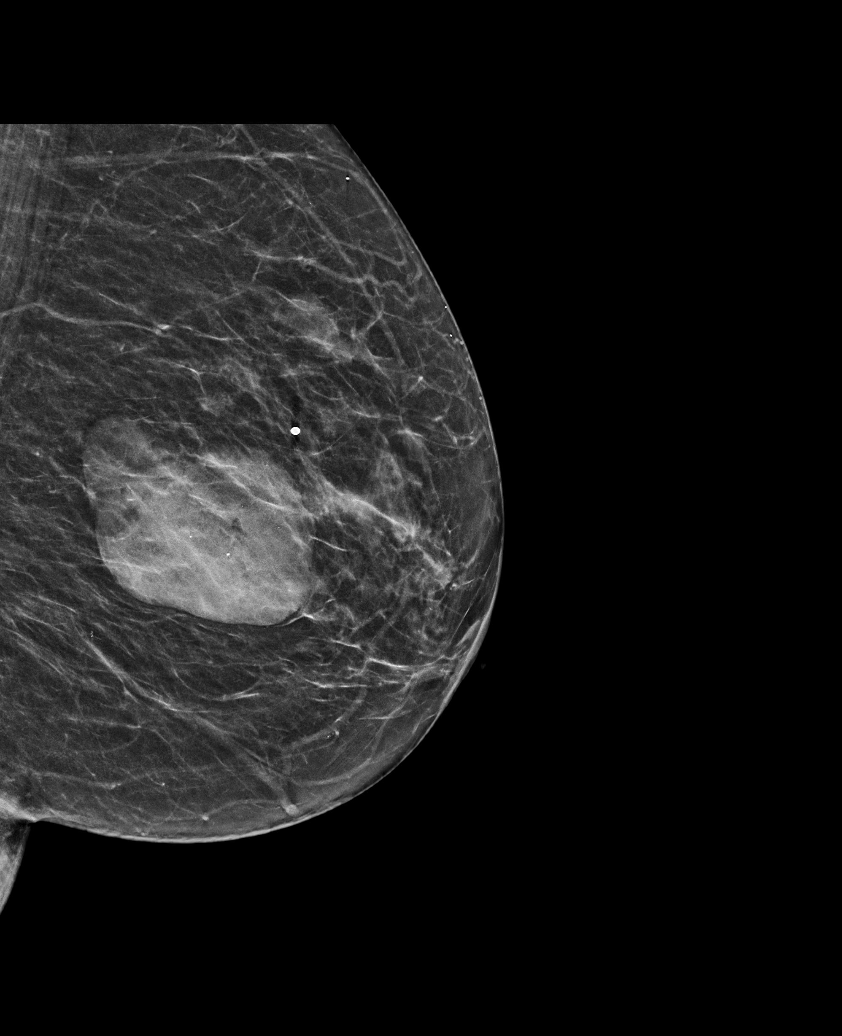

[L MLO]
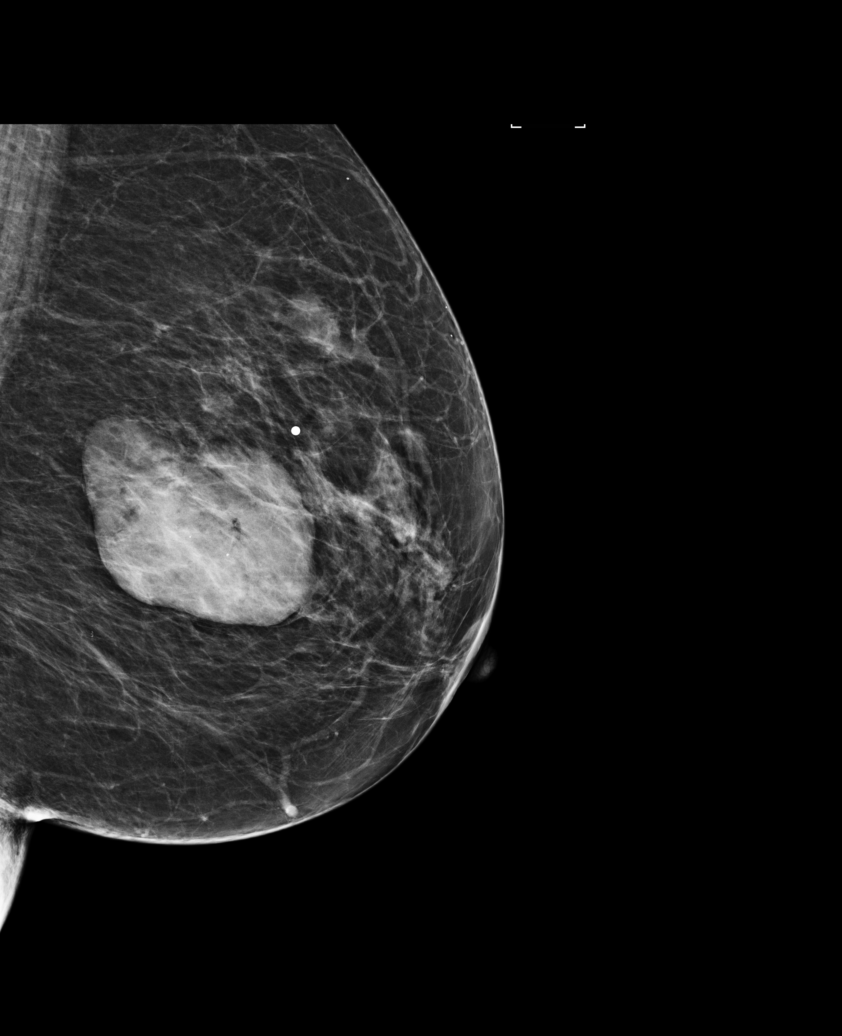

[L TAN]
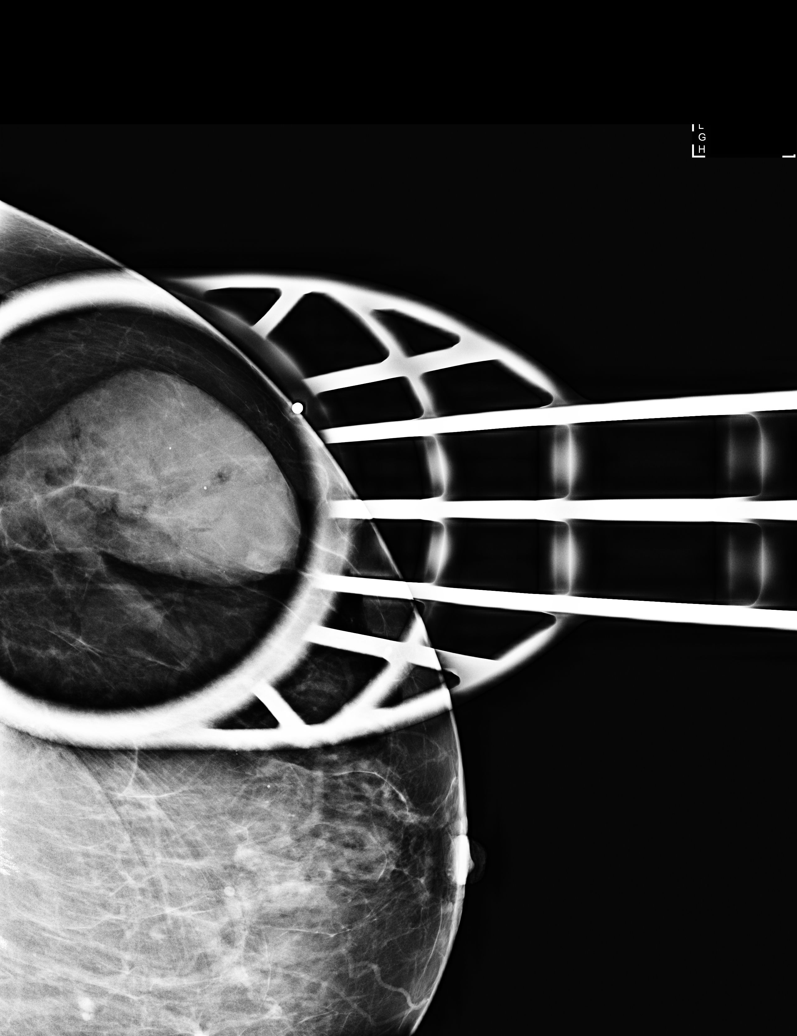

[6 of 21 positions shown; findings below may reference images not displayed]

ACR Breast Density Category b: There are scattered areas of
fibroglandular density.
FINDINGS: Two circumscribed stable masses are seen in the upper outer left
breast correlating with the palpable lumps. These have been stable
for many years and are consistent with a benign etiology. No
mammographic evidence of malignancy.

Mammographic images were processed with CAD.
IMPRESSION: Stable benign left breast masses.

RECOMMENDATION:
Recommend bilateral screening mammography in Thursday November, 2017.

I have discussed the findings and recommendations with the patient.
Results were also provided in writing at the conclusion of the
visit. If applicable, a reminder letter will be sent to the patient
regarding the next appointment.

BI-RADS CATEGORY  2: Benign.

## 2018-12-21 ENCOUNTER — Other Ambulatory Visit: Payer: Self-pay

## 2018-12-21 ENCOUNTER — Ambulatory Visit
Admission: RE | Admit: 2018-12-21 | Discharge: 2018-12-21 | Disposition: A | Discharge status: Home / Self Care | Payer: Medicare Other | Source: Ambulatory Visit | Attending: Family Medicine | Admitting: Family Medicine

## 2018-12-21 DIAGNOSIS — Z1231 Encounter for screening mammogram for malignant neoplasm of breast: Secondary | ICD-10-CM

## 2018-12-24 ENCOUNTER — Ambulatory Visit: Payer: Self-pay | Admitting: Orthopedic Surgery

## 2018-12-29 DIAGNOSIS — I10 Essential (primary) hypertension: Secondary | ICD-10-CM | POA: Diagnosis not present

## 2018-12-29 DIAGNOSIS — M1710 Unilateral primary osteoarthritis, unspecified knee: Secondary | ICD-10-CM | POA: Diagnosis not present

## 2018-12-29 DIAGNOSIS — Z1159 Encounter for screening for other viral diseases: Secondary | ICD-10-CM | POA: Diagnosis not present

## 2018-12-29 DIAGNOSIS — E1165 Type 2 diabetes mellitus with hyperglycemia: Secondary | ICD-10-CM | POA: Diagnosis not present

## 2018-12-29 DIAGNOSIS — E7800 Pure hypercholesterolemia, unspecified: Secondary | ICD-10-CM | POA: Diagnosis not present

## 2018-12-29 DIAGNOSIS — D539 Nutritional anemia, unspecified: Secondary | ICD-10-CM | POA: Diagnosis not present

## 2018-12-29 DIAGNOSIS — Z79899 Other long term (current) drug therapy: Secondary | ICD-10-CM | POA: Diagnosis not present

## 2018-12-29 DIAGNOSIS — E039 Hypothyroidism, unspecified: Secondary | ICD-10-CM | POA: Diagnosis not present

## 2019-01-15 DIAGNOSIS — S8992XA Unspecified injury of left lower leg, initial encounter: Secondary | ICD-10-CM | POA: Diagnosis not present

## 2019-01-15 DIAGNOSIS — Z03818 Encounter for observation for suspected exposure to other biological agents ruled out: Secondary | ICD-10-CM | POA: Diagnosis not present

## 2019-01-27 DIAGNOSIS — M545 Low back pain: Secondary | ICD-10-CM | POA: Diagnosis not present

## 2019-01-27 DIAGNOSIS — Z79899 Other long term (current) drug therapy: Secondary | ICD-10-CM | POA: Diagnosis not present

## 2019-01-27 DIAGNOSIS — G8929 Other chronic pain: Secondary | ICD-10-CM | POA: Diagnosis not present

## 2019-01-27 DIAGNOSIS — Z23 Encounter for immunization: Secondary | ICD-10-CM | POA: Diagnosis not present

## 2019-01-27 DIAGNOSIS — E1165 Type 2 diabetes mellitus with hyperglycemia: Secondary | ICD-10-CM | POA: Diagnosis not present

## 2019-01-27 DIAGNOSIS — M1710 Unilateral primary osteoarthritis, unspecified knee: Secondary | ICD-10-CM | POA: Diagnosis not present

## 2019-01-31 ENCOUNTER — Encounter (HOSPITAL_COMMUNITY)
Admission: RE | Admit: 2019-01-31 | Discharge: 2019-01-31 | Disposition: A | Discharge status: Home / Self Care | Payer: Medicare Other | Source: Ambulatory Visit | Attending: Orthopedic Surgery | Admitting: Orthopedic Surgery

## 2019-01-31 ENCOUNTER — Other Ambulatory Visit: Payer: Self-pay

## 2019-01-31 ENCOUNTER — Encounter (HOSPITAL_COMMUNITY): Payer: Self-pay

## 2019-01-31 ENCOUNTER — Ambulatory Visit: Payer: Self-pay | Admitting: Orthopedic Surgery

## 2019-01-31 ENCOUNTER — Other Ambulatory Visit (HOSPITAL_COMMUNITY)
Admission: RE | Admit: 2019-01-31 | Discharge: 2019-01-31 | Disposition: A | Discharge status: Home / Self Care | Payer: Medicare Other | Source: Ambulatory Visit | Attending: Orthopedic Surgery | Admitting: Orthopedic Surgery

## 2019-01-31 DIAGNOSIS — I10 Essential (primary) hypertension: Secondary | ICD-10-CM | POA: Insufficient documentation

## 2019-01-31 DIAGNOSIS — Z01818 Encounter for other preprocedural examination: Secondary | ICD-10-CM | POA: Diagnosis not present

## 2019-01-31 DIAGNOSIS — M1711 Unilateral primary osteoarthritis, right knee: Secondary | ICD-10-CM | POA: Insufficient documentation

## 2019-01-31 DIAGNOSIS — Z20828 Contact with and (suspected) exposure to other viral communicable diseases: Secondary | ICD-10-CM | POA: Diagnosis not present

## 2019-01-31 LAB — CBC
HCT: 38 % (ref 36.0–46.0)
Hemoglobin: 12.2 g/dL (ref 12.0–15.0)
MCH: 30.1 pg (ref 26.0–34.0)
MCHC: 32.1 g/dL (ref 30.0–36.0)
MCV: 93.8 fL (ref 80.0–100.0)
Platelets: 204 10*3/uL (ref 150–400)
RBC: 4.05 MIL/uL (ref 3.87–5.11)
RDW: 13.5 % (ref 11.5–15.5)
WBC: 5.8 10*3/uL (ref 4.0–10.5)
nRBC: 0 % (ref 0.0–0.2)

## 2019-01-31 LAB — URINALYSIS, ROUTINE W REFLEX MICROSCOPIC
Bilirubin Urine: NEGATIVE
Glucose, UA: NEGATIVE mg/dL
Hgb urine dipstick: NEGATIVE
Ketones, ur: NEGATIVE mg/dL
Leukocytes,Ua: NEGATIVE
Nitrite: NEGATIVE
Protein, ur: NEGATIVE mg/dL
Specific Gravity, Urine: 1.017 (ref 1.005–1.030)
pH: 6 (ref 5.0–8.0)

## 2019-01-31 LAB — HEMOGLOBIN A1C
Hgb A1c MFr Bld: 5.4 % (ref 4.8–5.6)
Mean Plasma Glucose: 108.28 mg/dL

## 2019-01-31 LAB — COMPREHENSIVE METABOLIC PANEL
ALT: 38 U/L (ref 0–44)
AST: 32 U/L (ref 15–41)
Albumin: 3.5 g/dL (ref 3.5–5.0)
Alkaline Phosphatase: 77 U/L (ref 38–126)
Anion gap: 13 (ref 5–15)
BUN: 13 mg/dL (ref 6–20)
CO2: 24 mmol/L (ref 22–32)
Calcium: 9.3 mg/dL (ref 8.9–10.3)
Chloride: 104 mmol/L (ref 98–111)
Creatinine, Ser: 0.84 mg/dL (ref 0.44–1.00)
GFR calc Af Amer: >60 mL/min (ref >60)
GFR calc non Af Amer: >60 mL/min (ref >60)
Glucose, Bld: 104 mg/dL — ABNORMAL HIGH (ref 70–99)
Potassium: 3.9 mmol/L (ref 3.5–5.1)
Sodium: 141 mmol/L (ref 135–145)
Total Bilirubin: 0.4 mg/dL (ref 0.3–1.2)
Total Protein: 6.4 g/dL — ABNORMAL LOW (ref 6.5–8.1)

## 2019-01-31 LAB — SURGICAL PCR SCREEN
MRSA, PCR: NEGATIVE
Staphylococcus aureus: NEGATIVE

## 2019-01-31 LAB — SARS CORONAVIRUS 2 (TAT 6-24 HRS): SARS Coronavirus 2: NEGATIVE

## 2019-01-31 LAB — PROTIME-INR
INR: 1.1 (ref 0.8–1.2)
Prothrombin Time: 14.4 seconds (ref 11.4–15.2)

## 2019-01-31 LAB — GLUCOSE, CAPILLARY: Glucose-Capillary: 97 mg/dL (ref 70–99)

## 2019-01-31 NOTE — H&P (View-Only) (Signed)
TOTAL KNEE ADMISSION H&P  Patient is being admitted for right total knee arthroplasty.  Subjective:  Chief Complaint:right knee pain.  HPI: Paula Hall, 58 y.o. female, has a history of pain and functional disability in the right knee due to arthritis and has failed non-surgical conservative treatments for greater than 12 weeks to includeNSAID's and/or analgesics, corticosteriod injections, viscosupplementation injections, flexibility and strengthening excercises, use of assistive devices, weight reduction as appropriate and activity modification.  Onset of symptoms was gradual, starting >10 years ago with rapidlly worsening course since that time. The patient noted no past surgery on the right knee(s).  Patient currently rates pain in the right knee(s) at 10 out of 10 with activity. Patient has night pain, worsening of pain with activity and weight bearing, pain that interferes with activities of daily living, pain with passive range of motion, crepitus and joint swelling.  Patient has evidence of subchondral cysts, subchondral sclerosis, periarticular osteophytes and joint space narrowing by imaging studies.  There is no active infection.  Patient Active Problem List   Diagnosis Date Noted  . Thyroid nodule 07/13/2017  . Obesity (BMI 35.0-39.9 without comorbidity) 07/08/2017  . Hoarseness 03/31/2017  . Goiter 03/31/2017  . Hypothyroid 03/31/2017  . Laryngopharyngeal reflux (LPR) 03/31/2017  . Smoker 03/31/2017  . 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 Leg swelling 11/21/2013   Past Medical History:  Diagnosis Date  . Abdominal hernia   . Anemia   . Arthritis   . Asthma   . Complication of anesthesia    woke up during procedure  . Depression   . Diabetes ([place])   . GERD (gastroesophageal reflux disease)   . Heart murmur   . Hypertension   . Hypothyroid   . Sleep apnea    does not wear CPAP  . Stroke [facility])    " mild stroke years ago"  . Vocal cord polyps    B/L    Past Surgical  History:  Procedure Laterality Date  . BREAST BIOPSY    . BREAST SURGERY      left breast biopsy  . CYST EXCISION  2017   abdomen  . FOOT SURGERY    . GASTRIC BYPASS  2006  . HERNIA REPAIR  2015  . MICROLARYNGOSCOPY Right 04/08/2017   Procedure: MICROLARYNGOSCOPY, BILATERAL VOCAL CORD POLYP REMOVAL;  Surgeon: [name], MD;  Location: [place];  Service: ENT;  Laterality: Right;    No current facility-administered medications for this visit.    No current outpatient medications on file.   Facility-Administered Medications Ordered in Other Visits  Medication Dose Route Frequency Provider Last Rate Last Dose  . 0.9 %  sodium chloride infusion   Intravenous Continuous Rod Can, MD 125 mL/hr at 02/02/19 [phone]    . acetaminophen (OFIRMEV) IV 1,000 mg  1,000 mg Intravenous To OR Samanyu Tinnell, Aaron Edelman, MD      . ceFAZolin (ANCEF) IVPB 2g/100 mL premix  2 g Intravenous On Call to [place], Aaron Edelman, MD      . chlorhexidine (HIBICLENS) 4 % liquid 4 application  60 mL Topical Once Zari Cly, Aaron Edelman, MD      . fentaNYL (SUBLIMAZE) injection 50-100 mcg  50-100 mcg Intravenous UD Audry Pili, MD   50 mcg at 02/02/19 0805  . midazolam (VERSED) injection 1-2 mg  1-2 mg Intravenous UD Audry Pili, MD   2 mg at 02/02/19 0805  . povidone-iodine 10 % swab 2 application  2 application Topical Once Rod Can, MD      .  tranexamic acid (CYKLOKAPRON) IVPB 1,000 mg  1,000 mg Intravenous To OR Kennah Hehr, Aaron Edelman, MD       Allergies  Allergen Reactions  . Shellfish Allergy Shortness Of Breath and Nausea And Vomiting  . Contrast Media [Iodinated Diagnostic Agents] Nausea And Vomiting  . Metrizamide Nausea And Vomiting    Social History   Tobacco Use  . Smoking status: Current Every Day Smoker    Types: Cigarettes    Last attempt to quit: 04/07/2017    Years since quitting: 1.8  . Smokeless tobacco: Never Used  Substance Use Topics  . Alcohol use: Yes    Comment: denies    Family History   Problem Relation Age of Onset  . Kidney cancer Mother   . Other Father        murder  . Colon cancer Neg Hx   . Stomach cancer Neg Hx      Review of Systems  Constitutional: Negative.   HENT: Negative.   Eyes: Negative.   Respiratory: Positive for shortness of breath.   Cardiovascular: Negative.   Gastrointestinal: Negative.   Genitourinary: Negative.   Musculoskeletal: Positive for back pain and joint pain.  Skin: Negative.   Neurological: Negative.   Endo/Heme/Allergies: Negative.   Psychiatric/Behavioral: Negative.     Objective:  Physical Exam  Vitals reviewed. Constitutional: She is oriented to person, place, and time. She appears well-developed and well-nourished.  HENT:  Head: Normocephalic and atraumatic.  Eyes: Pupils are equal, round, and reactive to light. Conjunctivae and EOM are normal.  Neck: Normal range of motion. No thyromegaly present.  Cardiovascular: Normal rate, regular rhythm and intact distal pulses.  Respiratory: Effort normal. No respiratory distress.  GI: Soft. She exhibits no distension.  Genitourinary:    Genitourinary Comments: deferred   Musculoskeletal:     Right knee: She exhibits decreased range of motion, swelling and abnormal alignment. Tenderness found. Medial joint line tenderness noted.  Neurological: She is oriented to person, place, and time.  Skin: Skin is warm and dry.  Psychiatric: She has a normal mood and affect. Her behavior is normal. Judgment and thought content normal.    Vital signs in last 24 hours: @VSRANGES @  Labs:   Estimated body mass index is 36.64 kg/m as calculated from the following:   Height as of 02/02/19: 4\' 10"  (1.473 m).   Weight as of 02/02/19: 79.5 kg.   Imaging Review Plain radiographs demonstrate severe degenerative joint disease of the right knee(s). The overall alignment issignificant varus. The bone quality appears to be adequate for age and reported activity  level.      Assessment/Plan:  End stage arthritis, right knee   The patient history, physical examination, clinical judgment of the provider and imaging studies are consistent with end stage degenerative joint disease of the right knee(s) and total knee arthroplasty is deemed medically necessary. The treatment options including medical management, injection therapy arthroscopy and arthroplasty were discussed at length. The risks and benefits of total knee arthroplasty were presented and reviewed. The risks due to aseptic loosening, infection, stiffness, patella tracking problems, thromboembolic complications and other imponderables were discussed. The patient acknowledged the explanation, agreed to proceed with the plan and consent was signed. Patient is being admitted for inpatient treatment for surgery, pain control, PT, OT, prophylactic antibiotics, VTE prophylaxis, progressive ambulation and ADL's and discharge planning. The patient is planning to be discharged home with OPPT    Anticipated LOS equal to or greater than 2 midnights due to - Age  71 and older with one or more of the following:  - Obesity  - Expected need for hospital services (PT, OT, Nursing) required for safe  discharge  - Anticipated need for postoperative skilled nursing care or inpatient rehab  - Active co-morbidities: Diabetes OR   - Unanticipated findings during/Post Surgery: None  - Patient is a high risk of re-admission due to: None

## 2019-01-31 NOTE — Patient Instructions (Signed)
DUE TO COVID-19 ONLY ONE VISITOR IS ALLOWED TO COME WITH YOU AND STAY IN THE WAITING ROOM ONLY DURING PRE OP AND PROCEDURE DAY OF SURGERY. THE 1 VISITOR MAY VISIT WITH YOU AFTER SURGERY IN YOUR PRIVATE ROOM DURING VISITING HOURS ONLY!  YOU NEED TO HAVE A COVID 19 TEST ON 01-31-19, PLEASE BEGIN THE QUARANTINE INSTRUCTIONS AS OUTLINED IN YOUR HANDOUT.                Paula Hall  01/31/2019   Your procedure is scheduled on: 02-02-19    Report to [facility] Main  Entrance    Report to Admitting at 6:00 AM     Call this number if you have problems the morning of surgery [phone]    Remember: NO SOLID FOOD AFTER MIDNIGHT THE NIGHT PRIOR TO SURGERY. NOTHING BY MOUTH EXCEPT CLEAR LIQUIDS UNTIL 5:30 AM . PLEASE FINISH GATORADE DRINK PER SURGEON ORDER  WHICH NEEDS TO BE COMPLETED AT 5:30 AM.   CLEAR LIQUID DIET   Foods Allowed                                                                     Foods Excluded  Coffee and tea, regular and decaf                             liquids that you cannot  Plain Jell-O any favor except red or purple                                           see through such as: Fruit ices (not with fruit pulp)                                     milk, soups, orange juice  Iced Popsicles                                    All solid food Carbonated beverages, regular and diet                                    Cranberry, grape and apple juices Sports drinks like Gatorade Lightly seasoned clear broth or consume(fat free) Sugar, honey syrup    _____________________________________________________________________     Take these medicines the morning of surgery with A SIP OF WATER: Amlodipine (Norvasc), Levothyroid (Synthroid),  Omeprazole (Prilosec)  BRUSH YOUR TEETH MORNING OF SURGERY AND RINSE YOUR MOUTH OUT, NO CHEWING GUM CANDY OR MINTS.   DO NOT TAKE ANY DIABETIC MEDICATIONS DAY OF YOUR SURGERY                               You may not have any  metal on your body including hair pins and  piercings    Do not wear jewelry, make-up, lotions, powders or perfumes, deodorant             Do not wear nail polish on your fingernails.  Do not shave  48 hours prior to surgery.     Do not bring valuables to the hospital. [place].  Contacts, dentures or bridgework may not be worn into surgery.  Leave suitcase in the car. After surgery it may be brought to your room.      Special Instructions: N/A              Please read over the following fact sheets you were given: _____________________________________________________________________  How to Manage Your Diabetes Before and After Surgery  Why is it important to control my blood sugar before and after surgery? . Improving blood sugar levels before and after surgery helps healing and can limit problems. . A way of improving blood sugar control is eating a healthy diet by: o  Eating less sugar and carbohydrates o  Increasing activity/exercise o  Talking with your doctor about reaching your blood sugar goals . High blood sugars (greater than 180 mg/dL) can raise your risk of infections and slow your recovery, so you will need to focus on controlling your diabetes during the weeks before surgery. . Make sure that the doctor who takes care of your diabetes knows about your planned surgery including the date and location.  How do I manage my blood sugar before surgery? . Check your blood sugar at least 4 times a day, starting 2 days before surgery, to make sure that the level is not too high or low. o Check your blood sugar the morning of your surgery when you wake up and every 2 hours until you get to the Short Stay unit. . If your blood sugar is less than 70 mg/dL, you will need to treat for low blood sugar: o Do not take insulin. o Treat a low blood sugar (less than 70 mg/dL) with  cup of clear juice (cranberry or apple), 4  glucose tablets, OR glucose gel. o Recheck blood sugar in 15 minutes after treatment (to make sure it is greater than 70 mg/dL). If your blood sugar is not greater than 70 mg/dL on recheck, call [phone] for further instructions. . Report your blood sugar to the short stay nurse when you get to Short Stay.  . If you are admitted to the hospital after surgery: o Your blood sugar will be checked by the staff and you will probably be given insulin after surgery (instead of oral diabetes medicines) to make sure you have good blood sugar levels. o The goal for blood sugar control after surgery is 80-180 mg/dL.   WHAT DO I DO ABOUT MY DIABETES MEDICATION?  Marland Kitchen Do not take oral diabetes medicines (pills) the morning of surgery.  . THE DAY BEFORE SURGERY, take your usual Metformin                 [place] - Preparing for Surgery Before surgery, you can play an important role.  Because skin is not sterile, your skin needs to be as free of germs as possible.  You can reduce the number of germs on your skin by washing with CHG (chlorahexidine gluconate) soap before surgery.  CHG is an antiseptic cleaner which kills germs and bonds with the skin  to continue killing germs even after washing. Please DO NOT use if you have an allergy to CHG or antibacterial soaps.  If your skin becomes reddened/irritated stop using the CHG and inform your nurse when you arrive at Short Stay. Do not shave (including legs and underarms) for at least 48 hours prior to the first CHG shower.  You may shave your face/neck. Please follow these instructions carefully:  1.  Shower with CHG Soap the night before surgery and the  morning of Surgery.  2.  If you choose to wash your hair, wash your hair first as usual with your  normal  shampoo.  3.  After you shampoo, rinse your hair and body thoroughly to remove the  shampoo.                           4.  Use CHG as you would any other liquid soap.  You can apply chg directly   to the skin and wash                       Gently with a scrungie or clean washcloth.  5.  Apply the CHG Soap to your body ONLY FROM THE NECK DOWN.   Do not use on face/ open                           Wound or open sores. Avoid contact with eyes, ears mouth and genitals (private parts).                       Wash face,  Genitals (private parts) with your normal soap.             6.  Wash thoroughly, paying special attention to the area where your surgery  will be performed.  7.  Thoroughly rinse your body with warm water from the neck down.  8.  DO NOT shower/wash with your normal soap after using and rinsing off  the CHG Soap.                9.  Pat yourself dry with a clean towel.            10.  Wear clean pajamas.            11.  Place clean sheets on your bed the night of your first shower and do not  sleep with pets. Day of Surgery : Do not apply any lotions/deodorants the morning of surgery.  Please wear clean clothes to the hospital/surgery center.  FAILURE TO FOLLOW THESE INSTRUCTIONS MAY RESULT IN THE CANCELLATION OF YOUR SURGERY PATIENT SIGNATURE_________________________________  NURSE SIGNATURE__________________________________  ________________________________________________________________________   Adam Phenix  An incentive spirometer is a tool that can help keep your lungs clear and active. This tool measures how well you are filling your lungs with each breath. Taking long deep breaths may help reverse or decrease the chance of developing breathing (pulmonary) problems (especially infection) following:  A long period of time when you are unable to move or be active. BEFORE THE PROCEDURE   If the spirometer includes an indicator to show your best effort, your nurse or respiratory therapist will set it to a desired goal.  If possible, sit up straight or lean slightly forward. Try not to slouch.  Hold the incentive spirometer in an upright  position. INSTRUCTIONS FOR USE  1. Sit on the edge of your bed if possible, or sit up as far as you can in bed or on a chair. 2. Hold the incentive spirometer in an upright position. 3. Breathe out normally. 4. Place the mouthpiece in your mouth and seal your lips tightly around it. 5. Breathe in slowly and as deeply as possible, raising the piston or the ball toward the top of the column. 6. Hold your breath for 3-5 seconds or for as long as possible. Allow the piston or ball to fall to the bottom of the column. 7. Remove the mouthpiece from your mouth and breathe out normally. 8. Rest for a few seconds and repeat Steps 1 through 7 at least 10 times every 1-2 hours when you are awake. Take your time and take a few normal breaths between deep breaths. 9. The spirometer may include an indicator to show your best effort. Use the indicator as a goal to work toward during each repetition. 10. After each set of 10 deep breaths, practice coughing to be sure your lungs are clear. If you have an incision (the cut made at the time of surgery), support your incision when coughing by placing a pillow or rolled up towels firmly against it. Once you are able to get out of bed, walk around indoors and cough well. You may stop using the incentive spirometer when instructed by your caregiver.  RISKS AND COMPLICATIONS  Take your time so you do not get dizzy or light-headed.  If you are in pain, you may need to take or ask for pain medication before doing incentive spirometry. It is harder to take a deep breath if you are having pain. AFTER USE  Rest and breathe slowly and easily.  It can be helpful to keep track of a log of your progress. Your caregiver can provide you with a simple table to help with this. If you are using the spirometer at home, follow these instructions: [place] IF:   You are having difficultly using the spirometer.  You have trouble using the spirometer as often as  instructed.  Your pain medication is not giving enough relief while using the spirometer.  You develop fever of 100.5 F (38.1 C) or higher. SEEK IMMEDIATE MEDICAL CARE IF:   You cough up bloody sputum that had not been present before.  You develop fever of 102 F (38.9 C) or greater.  You develop worsening pain at or near the incision site. MAKE SURE YOU:   Understand these instructions.  Will watch your condition.  Will get help right away if you are not doing well or get worse. Document Released: 09/01/2006 Document Revised: 07/14/2011 Document Reviewed: 11/02/2006 ExitCare Patient Information 2014 ExitCare, [place].   ________________________________________________________________________          WHAT IS A BLOOD TRANSFUSION? Blood Transfusion Information  A transfusion is the replacement of blood or some of its parts. Blood is made up of multiple cells which provide different functions.  Red blood cells carry oxygen and are used for blood loss replacement.  White blood cells fight against infection.  Platelets control bleeding.  Plasma helps clot blood.  Other blood products are available for specialized needs, such as hemophilia or other clotting disorders. BEFORE THE TRANSFUSION  Who gives blood for transfusions?   Healthy volunteers who are fully evaluated to make sure their blood is safe. This is blood bank blood. Transfusion therapy is the safest it has ever been in the practice of  medicine. Before blood is taken from a donor, a complete history is taken to make sure that person has no history of diseases nor engages in risky social behavior (examples are intravenous drug use or sexual activity with multiple partners). The donor's travel history is screened to minimize risk of transmitting infections, such as malaria. The donated blood is tested for signs of infectious diseases, such as HIV and hepatitis. The blood is then tested to be sure it is compatible  with you in order to minimize the chance of a transfusion reaction. If you or a relative donates blood, this is often done in anticipation of surgery and is not appropriate for emergency situations. It takes many days to process the donated blood. RISKS AND COMPLICATIONS Although transfusion therapy is very safe and saves many lives, the main dangers of transfusion include:   Getting an infectious disease.  Developing a transfusion reaction. This is an allergic reaction to something in the blood you were given. Every precaution is taken to prevent this. The decision to have a blood transfusion has been considered carefully by your caregiver before blood is given. Blood is not given unless the benefits outweigh the risks. AFTER THE TRANSFUSION  Right after receiving a blood transfusion, you will usually feel much better and more energetic. This is especially true if your red blood cells have gotten low (anemic). The transfusion raises the level of the red blood cells which carry oxygen, and this usually causes an energy increase.  The nurse administering the transfusion will monitor you carefully for complications. HOME CARE INSTRUCTIONS  No special instructions are needed after a transfusion. You may find your energy is better. Speak with your caregiver about any limitations on activity for underlying diseases you may have. SEEK MEDICAL CARE IF:   Your condition is not improving after your transfusion.  You develop redness or irritation at the intravenous (IV) site. SEEK IMMEDIATE MEDICAL CARE IF:  Any of the following symptoms occur over the next 12 hours:  Shaking chills.  You have a temperature by mouth above 102 F (38.9 C), not controlled by medicine.  Chest, back, or muscle pain.  People around you feel you are not acting correctly or are confused.  Shortness of breath or difficulty breathing.  Dizziness and fainting.  You get a rash or develop hives.  You have a decrease  in urine output.  Your urine turns a dark color or changes to pink, red, or brown. Any of the following symptoms occur over the next 10 days:  You have a temperature by mouth above 102 F (38.9 C), not controlled by medicine.  Shortness of breath.  Weakness after normal activity.  The white part of the eye turns yellow (jaundice).  You have a decrease in the amount of urine or are urinating less often.  Your urine turns a dark color or changes to pink, red, or brown. Document Released: 04/18/2000 Document Revised: 07/14/2011 Document Reviewed: 12/06/2007 [facility] Patient Information 2014 [place], [place].  _______________________________________________________________________

## 2019-01-31 NOTE — H&P (Signed)
TOTAL KNEE ADMISSION H&P  Patient is being admitted for right total knee arthroplasty.  Subjective:  Chief Complaint:right knee pain.  HPI: Paula Hall, 58 y.o. female, has a history of pain and functional disability in the right knee due to arthritis and has failed non-surgical conservative treatments for greater than 12 weeks to includeNSAID's and/or analgesics, corticosteriod injections, viscosupplementation injections, flexibility and strengthening excercises, use of assistive devices, weight reduction as appropriate and activity modification.  Onset of symptoms was gradual, starting >10 years ago with rapidlly worsening course since that time. The patient noted no past surgery on the right knee(s).  Patient currently rates pain in the right knee(s) at 10 out of 10 with activity. Patient has night pain, worsening of pain with activity and weight bearing, pain that interferes with activities of daily living, pain with passive range of motion, crepitus and joint swelling.  Patient has evidence of subchondral cysts, subchondral sclerosis, periarticular osteophytes and joint space narrowing by imaging studies.  There is no active infection.  Patient Active Problem List   Diagnosis Date Noted  . Thyroid nodule 07/13/2017  . Obesity (BMI 35.0-39.9 without comorbidity) 07/08/2017  . Hoarseness 03/31/2017  . Goiter 03/31/2017  . Hypothyroid 03/31/2017  . Laryngopharyngeal reflux (LPR) 03/31/2017  . Smoker 03/31/2017  . 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 Leg swelling 11/21/2013   Past Medical History:  Diagnosis Date  . Abdominal hernia   . Anemia   . Arthritis   . Asthma   . Complication of anesthesia    woke up during procedure  . Depression   . Diabetes ([place])   . GERD (gastroesophageal reflux disease)   . Heart murmur   . Hypertension   . Hypothyroid   . Sleep apnea    does not wear CPAP  . Stroke [facility])    " mild stroke years ago"  . Vocal cord polyps    B/L    Past Surgical  History:  Procedure Laterality Date  . BREAST BIOPSY    . BREAST SURGERY      left breast biopsy  . CYST EXCISION  2017   abdomen  . FOOT SURGERY    . GASTRIC BYPASS  2006  . HERNIA REPAIR  2015  . MICROLARYNGOSCOPY Right 04/08/2017   Procedure: MICROLARYNGOSCOPY, BILATERAL VOCAL CORD POLYP REMOVAL;  Surgeon: [name], MD;  Location: [facility];  Service: ENT;  Laterality: Right;    No current facility-administered medications for this visit.    No current outpatient medications on file.   Facility-Administered Medications Ordered in Other Visits  Medication Dose Route Frequency Provider Last Rate Last Dose  . 0.9 %  sodium chloride infusion   Intravenous Continuous Rod Can, MD 125 mL/hr at 02/02/19 [phone]    . acetaminophen (OFIRMEV) IV 1,000 mg  1,000 mg Intravenous To OR Jovanie Verge, Aaron Edelman, MD      . ceFAZolin (ANCEF) IVPB 2g/100 mL premix  2 g Intravenous On Call to [place], Aaron Edelman, MD      . chlorhexidine (HIBICLENS) 4 % liquid 4 application  60 mL Topical Once Dijon Cosens, Aaron Edelman, MD      . fentaNYL (SUBLIMAZE) injection 50-100 mcg  50-100 mcg Intravenous UD Audry Pili, MD   50 mcg at 02/02/19 0805  . midazolam (VERSED) injection 1-2 mg  1-2 mg Intravenous UD Audry Pili, MD   2 mg at 02/02/19 0805  . povidone-iodine 10 % swab 2 application  2 application Topical Once Rod Can, MD      .  tranexamic acid (CYKLOKAPRON) IVPB 1,000 mg  1,000 mg Intravenous To OR Iola Turri, Aaron Edelman, MD       Allergies  Allergen Reactions  . Shellfish Allergy Shortness Of Breath and Nausea And Vomiting  . Contrast Media [Iodinated Diagnostic Agents] Nausea And Vomiting  . Metrizamide Nausea And Vomiting    Social History   Tobacco Use  . Smoking status: Current Every Day Smoker    Types: Cigarettes    Last attempt to quit: 04/07/2017    Years since quitting: 1.8  . Smokeless tobacco: Never Used  Substance Use Topics  . Alcohol use: Yes    Comment: denies    Family History   Problem Relation Age of Onset  . Kidney cancer Mother   . Other Father        murder  . Colon cancer Neg Hx   . Stomach cancer Neg Hx      Review of Systems  Constitutional: Negative.   HENT: Negative.   Eyes: Negative.   Respiratory: Positive for shortness of breath.   Cardiovascular: Negative.   Gastrointestinal: Negative.   Genitourinary: Negative.   Musculoskeletal: Positive for back pain and joint pain.  Skin: Negative.   Neurological: Negative.   Endo/Heme/Allergies: Negative.   Psychiatric/Behavioral: Negative.     Objective:  Physical Exam  Vitals reviewed. Constitutional: She is oriented to person, place, and time. She appears well-developed and well-nourished.  HENT:  Head: Normocephalic and atraumatic.  Eyes: Pupils are equal, round, and reactive to light. Conjunctivae and EOM are normal.  Neck: Normal range of motion. No thyromegaly present.  Cardiovascular: Normal rate, regular rhythm and intact distal pulses.  Respiratory: Effort normal. No respiratory distress.  GI: Soft. She exhibits no distension.  Genitourinary:    Genitourinary Comments: deferred   Musculoskeletal:     Right knee: She exhibits decreased range of motion, swelling and abnormal alignment. Tenderness found. Medial joint line tenderness noted.  Neurological: She is oriented to person, place, and time.  Skin: Skin is warm and dry.  Psychiatric: She has a normal mood and affect. Her behavior is normal. Judgment and thought content normal.    Vital signs in last 24 hours: @VSRANGES @  Labs:   Estimated body mass index is 36.64 kg/m as calculated from the following:   Height as of 02/02/19: 4\' 10"  (1.473 m).   Weight as of 02/02/19: 79.5 kg.   Imaging Review Plain radiographs demonstrate severe degenerative joint disease of the right knee(s). The overall alignment issignificant varus. The bone quality appears to be adequate for age and reported activity  level.      Assessment/Plan:  End stage arthritis, right knee   The patient history, physical examination, clinical judgment of the provider and imaging studies are consistent with end stage degenerative joint disease of the right knee(s) and total knee arthroplasty is deemed medically necessary. The treatment options including medical management, injection therapy arthroscopy and arthroplasty were discussed at length. The risks and benefits of total knee arthroplasty were presented and reviewed. The risks due to aseptic loosening, infection, stiffness, patella tracking problems, thromboembolic complications and other imponderables were discussed. The patient acknowledged the explanation, agreed to proceed with the plan and consent was signed. Patient is being admitted for inpatient treatment for surgery, pain control, PT, OT, prophylactic antibiotics, VTE prophylaxis, progressive ambulation and ADL's and discharge planning. The patient is planning to be discharged home with OPPT    Anticipated LOS equal to or greater than 2 midnights due to - Age  43 and older with one or more of the following:  - Obesity  - Expected need for hospital services (PT, OT, Nursing) required for safe  discharge  - Anticipated need for postoperative skilled nursing care or inpatient rehab  - Active co-morbidities: Diabetes OR   - Unanticipated findings during/Post Surgery: None  - Patient is a high risk of re-admission due to: None

## 2019-02-01 LAB — ABO/RH: ABO/RH(D): A POS

## 2019-02-01 NOTE — Progress Notes (Signed)
PCP - Glendon Axe Cardiologist -   Chest x-ray -  EKG - 01-31-19 Stress Test -  ECHO -  Cardiac Cath -   Sleep Study -  CPAP -   Fasting Blood Sugar -  Checks Blood Sugar _____ times a day  Blood Thinner Instructions: Aspirin Instructions: Last Dose:  Anesthesia review:   Patient denies shortness of breath, fever, cough and chest pain at PAT appointment   Patient verbalized understanding of instructions that were given to them at the PAT appointment. Patient was also instructed that they will need to review over the PAT instructions again at home before surgery.

## 2019-02-01 NOTE — Anesthesia Preprocedure Evaluation (Addendum)
Anesthesia Evaluation  Patient identified by MRN, date of birth, ID band Patient awake    Reviewed: Allergy & Precautions, NPO status , Patient's Chart, lab work & pertinent test results  History of Anesthesia Complications Negative for: history of anesthetic complications  Airway Mallampati: III  TM Distance: >3 FB Neck ROM: Full    Dental  (+) Edentulous Upper, Edentulous Lower   Pulmonary asthma , sleep apnea , Current SmokerPatient did not abstain from smoking.,    Pulmonary exam normal        Cardiovascular hypertension, Pt. on medications (-) anginaNormal cardiovascular exam     Neuro/Psych PSYCHIATRIC DISORDERS Depression CVA, No Residual Symptoms    GI/Hepatic Neg liver ROS, GERD  Medicated and Controlled, S/p gastric bypass    Endo/Other  diabetes, Type 2, Oral Hypoglycemic AgentsHypothyroidism  Obesity   Renal/GU negative Renal ROS     Musculoskeletal  (+) Arthritis ,   Abdominal   Peds  Hematology negative hematology ROS (+)   Anesthesia Other Findings Vocal cord polyps   Reproductive/Obstetrics                            Anesthesia Physical Anesthesia Plan  ASA: III  Anesthesia Plan: Spinal   Post-op Pain Management:  Regional for Post-op pain   Induction:   PONV Risk Score and Plan: 2 and Treatment may vary due to age or medical condition and Propofol infusion  Airway Management Planned: Natural Airway and Simple Face Mask  Additional Equipment: None  Intra-op Plan:   Post-operative Plan:   Informed Consent:   Plan Discussed with: CRNA and Anesthesiologist  Anesthesia Plan Comments:         Anesthesia Quick Evaluation

## 2019-02-02 ENCOUNTER — Ambulatory Visit (HOSPITAL_COMMUNITY): Payer: Medicare Other | Admitting: Physician Assistant

## 2019-02-02 ENCOUNTER — Encounter (HOSPITAL_COMMUNITY)
Admission: RE | Disposition: A | Discharge status: Home / Self Care | Payer: Self-pay | Source: Home / Self Care | Attending: Orthopedic Surgery

## 2019-02-02 ENCOUNTER — Other Ambulatory Visit: Payer: Self-pay

## 2019-02-02 ENCOUNTER — Ambulatory Visit (HOSPITAL_COMMUNITY)
Admission: RE | Admit: 2019-02-02 | Discharge: 2019-02-03 | Disposition: A | Discharge status: Home / Self Care | Payer: Medicare Other | Attending: Orthopedic Surgery | Admitting: Orthopedic Surgery

## 2019-02-02 ENCOUNTER — Encounter (HOSPITAL_COMMUNITY): Payer: Self-pay

## 2019-02-02 ENCOUNTER — Ambulatory Visit (HOSPITAL_COMMUNITY): Payer: Medicare Other

## 2019-02-02 DIAGNOSIS — Z6836 Body mass index (BMI) 36.0-36.9, adult: Secondary | ICD-10-CM | POA: Diagnosis not present

## 2019-02-02 DIAGNOSIS — I10 Essential (primary) hypertension: Secondary | ICD-10-CM | POA: Diagnosis not present

## 2019-02-02 DIAGNOSIS — E119 Type 2 diabetes mellitus without complications: Secondary | ICD-10-CM | POA: Insufficient documentation

## 2019-02-02 DIAGNOSIS — Z96651 Presence of right artificial knee joint: Secondary | ICD-10-CM

## 2019-02-02 DIAGNOSIS — Z8673 Personal history of transient ischemic attack (TIA), and cerebral infarction without residual deficits: Secondary | ICD-10-CM | POA: Diagnosis not present

## 2019-02-02 DIAGNOSIS — Z7984 Long term (current) use of oral hypoglycemic drugs: Secondary | ICD-10-CM | POA: Insufficient documentation

## 2019-02-02 DIAGNOSIS — K219 Gastro-esophageal reflux disease without esophagitis: Secondary | ICD-10-CM | POA: Diagnosis not present

## 2019-02-02 DIAGNOSIS — G4730 Sleep apnea, unspecified: Secondary | ICD-10-CM | POA: Insufficient documentation

## 2019-02-02 DIAGNOSIS — M25561 Pain in right knee: Secondary | ICD-10-CM | POA: Diagnosis present

## 2019-02-02 DIAGNOSIS — M1711 Unilateral primary osteoarthritis, right knee: Secondary | ICD-10-CM | POA: Diagnosis present

## 2019-02-02 DIAGNOSIS — F17210 Nicotine dependence, cigarettes, uncomplicated: Secondary | ICD-10-CM | POA: Insufficient documentation

## 2019-02-02 DIAGNOSIS — E669 Obesity, unspecified: Secondary | ICD-10-CM | POA: Insufficient documentation

## 2019-02-02 DIAGNOSIS — G8918 Other acute postprocedural pain: Secondary | ICD-10-CM | POA: Diagnosis not present

## 2019-02-02 DIAGNOSIS — Z471 Aftercare following joint replacement surgery: Secondary | ICD-10-CM | POA: Diagnosis not present

## 2019-02-02 DIAGNOSIS — J45909 Unspecified asthma, uncomplicated: Secondary | ICD-10-CM | POA: Diagnosis not present

## 2019-02-02 HISTORY — PX: KNEE ARTHROPLASTY: SHX992

## 2019-02-02 LAB — GLUCOSE, CAPILLARY
Glucose-Capillary: 106 mg/dL — ABNORMAL HIGH (ref 70–99)
Glucose-Capillary: 113 mg/dL — ABNORMAL HIGH (ref 70–99)
Glucose-Capillary: 327 mg/dL — ABNORMAL HIGH (ref 70–99)
Glucose-Capillary: 344 mg/dL — ABNORMAL HIGH (ref 70–99)

## 2019-02-02 LAB — TYPE AND SCREEN
ABO/RH(D): A POS
Antibody Screen: NEGATIVE

## 2019-02-02 SURGERY — ARTHROPLASTY, KNEE, TOTAL, USING IMAGELESS COMPUTER-ASSISTED NAVIGATION
Anesthesia: Spinal | Site: Knee | Laterality: Right

## 2019-02-02 MED ORDER — DEXAMETHASONE SODIUM PHOSPHATE 10 MG/ML IJ SOLN
10.0000 mg | Freq: Once | INTRAMUSCULAR | Status: AC
  Administered 2019-02-03: 10 mg via INTRAVENOUS
  Filled 2019-02-02: qty 1

## 2019-02-02 MED ORDER — ACETAMINOPHEN 325 MG PO TABS: 325.0000 mg | ORAL_TABLET | Freq: Four times a day (QID) | ORAL | Status: DC | PRN

## 2019-02-02 MED ORDER — STERILE WATER FOR IRRIGATION IR SOLN
Status: DC | PRN
  Administered 2019-02-02: 2000 mL

## 2019-02-02 MED ORDER — BUPIVACAINE HCL (PF) 0.25 % IJ SOLN
INTRAMUSCULAR | Status: DC | PRN
  Administered 2019-02-02: 30 mL

## 2019-02-02 MED ORDER — PHENYLEPHRINE HCL (PRESSORS) 10 MG/ML IV SOLN
INTRAVENOUS | Status: AC
  Filled 2019-02-02: qty 1

## 2019-02-02 MED ORDER — PROPOFOL 10 MG/ML IV BOLUS
INTRAVENOUS | Status: AC
  Filled 2019-02-02: qty 20

## 2019-02-02 MED ORDER — CEFAZOLIN SODIUM-DEXTROSE 2-4 GM/100ML-% IV SOLN
2.0000 g | INTRAVENOUS | Status: AC
  Administered 2019-02-02: 2 g via INTRAVENOUS
  Filled 2019-02-02: qty 100

## 2019-02-02 MED ORDER — METOCLOPRAMIDE HCL 5 MG PO TABS: 5.0000 mg | ORAL_TABLET | Freq: Three times a day (TID) | ORAL | Status: DC | PRN

## 2019-02-02 MED ORDER — HYDROXYZINE HCL 25 MG PO TABS
25.0000 mg | ORAL_TABLET | Freq: Every day | ORAL | Status: DC
  Administered 2019-02-02: 25 mg via ORAL
  Filled 2019-02-02: qty 1

## 2019-02-02 MED ORDER — ONDANSETRON HCL 4 MG/2ML IJ SOLN
INTRAMUSCULAR | Status: DC | PRN
  Administered 2019-02-02: 4 mg via INTRAVENOUS

## 2019-02-02 MED ORDER — METHOCARBAMOL 500 MG IVPB - SIMPLE MED
500.0000 mg | Freq: Four times a day (QID) | INTRAVENOUS | Status: DC | PRN
  Administered 2019-02-02: 13:00:00 500 mg via INTRAVENOUS
  Filled 2019-02-02: qty 50

## 2019-02-02 MED ORDER — METHOCARBAMOL 500 MG IVPB - SIMPLE MED
INTRAVENOUS | Status: AC
  Filled 2019-02-02: qty 50

## 2019-02-02 MED ORDER — ROSUVASTATIN CALCIUM 20 MG PO TABS
20.0000 mg | ORAL_TABLET | Freq: Every evening | ORAL | Status: DC
  Administered 2019-02-02: 20 mg via ORAL
  Filled 2019-02-02: qty 1

## 2019-02-02 MED ORDER — SENNA 8.6 MG PO TABS
1.0000 | ORAL_TABLET | Freq: Two times a day (BID) | ORAL | Status: DC
  Administered 2019-02-02: 8.6 mg via ORAL
  Filled 2019-02-02: qty 1

## 2019-02-02 MED ORDER — PANTOPRAZOLE SODIUM 40 MG PO TBEC
80.0000 mg | DELAYED_RELEASE_TABLET | Freq: Every day | ORAL | Status: DC
  Administered 2019-02-02 – 2019-02-03 (×2): 80 mg via ORAL
  Filled 2019-02-02 (×2): qty 2

## 2019-02-02 MED ORDER — ONDANSETRON HCL 4 MG PO TABS: 4.0000 mg | ORAL_TABLET | Freq: Four times a day (QID) | ORAL | Status: DC | PRN

## 2019-02-02 MED ORDER — DIPHENHYDRAMINE HCL 12.5 MG/5ML PO ELIX: 12.5000 mg | ORAL_SOLUTION | ORAL | Status: DC | PRN

## 2019-02-02 MED ORDER — HYDROMORPHONE HCL 1 MG/ML IJ SOLN
INTRAMUSCULAR | Status: AC
  Filled 2019-02-02: qty 1

## 2019-02-02 MED ORDER — KETOROLAC TROMETHAMINE 30 MG/ML IJ SOLN
INTRAMUSCULAR | Status: AC
  Filled 2019-02-02: qty 1

## 2019-02-02 MED ORDER — ASPIRIN 81 MG PO CHEW
81.0000 mg | CHEWABLE_TABLET | Freq: Two times a day (BID) | ORAL | Status: DC
  Administered 2019-02-02 – 2019-02-03 (×2): 81 mg via ORAL
  Filled 2019-02-02 (×2): qty 1

## 2019-02-02 MED ORDER — PROPOFOL 500 MG/50ML IV EMUL
INTRAVENOUS | Status: AC
  Filled 2019-02-02: qty 50

## 2019-02-02 MED ORDER — QUETIAPINE FUMARATE 50 MG PO TABS
50.0000 mg | ORAL_TABLET | Freq: Every day | ORAL | Status: DC
  Filled 2019-02-02: qty 2

## 2019-02-02 MED ORDER — DOCUSATE SODIUM 100 MG PO CAPS
100.0000 mg | ORAL_CAPSULE | Freq: Two times a day (BID) | ORAL | Status: DC
  Administered 2019-02-02 – 2019-02-03 (×2): 100 mg via ORAL
  Filled 2019-02-02 (×2): qty 1

## 2019-02-02 MED ORDER — SODIUM CHLORIDE (PF) 0.9 % IJ SOLN
INTRAMUSCULAR | Status: DC | PRN
  Administered 2019-02-02: 30 mL

## 2019-02-02 MED ORDER — MENTHOL 3 MG MT LOZG: 1.0000 | LOZENGE | OROMUCOSAL | Status: DC | PRN

## 2019-02-02 MED ORDER — ISOPROPYL ALCOHOL 70 % SOLN
Status: AC
  Filled 2019-02-02: qty 480

## 2019-02-02 MED ORDER — EPHEDRINE 5 MG/ML INJ
INTRAVENOUS | Status: AC
  Filled 2019-02-02: qty 10

## 2019-02-02 MED ORDER — SODIUM CHLORIDE 0.9 % IV SOLN
INTRAVENOUS | Status: DC | PRN
  Administered 2019-02-02: 09:00:00 25 ug/min via INTRAVENOUS

## 2019-02-02 MED ORDER — FENTANYL CITRATE (PF) 100 MCG/2ML IJ SOLN
INTRAMUSCULAR | Status: AC
  Filled 2019-02-02: qty 2

## 2019-02-02 MED ORDER — SODIUM CHLORIDE 0.9 % IV SOLN
INTRAVENOUS | Status: DC
  Administered 2019-02-02: 07:00:00 via INTRAVENOUS

## 2019-02-02 MED ORDER — PROPOFOL 10 MG/ML IV BOLUS
INTRAVENOUS | Status: DC | PRN
  Administered 2019-02-02: 10 mg via INTRAVENOUS
  Administered 2019-02-02 (×2): 20 mg via INTRAVENOUS

## 2019-02-02 MED ORDER — SODIUM CHLORIDE 0.9 % IV SOLN
INTRAVENOUS | Status: DC
  Administered 2019-02-02 (×2): via INTRAVENOUS

## 2019-02-02 MED ORDER — OXYCODONE HCL 5 MG PO TABS
5.0000 mg | ORAL_TABLET | ORAL | Status: DC | PRN
  Administered 2019-02-02 – 2019-02-03 (×3): 10 mg via ORAL
  Filled 2019-02-02 (×3): qty 2

## 2019-02-02 MED ORDER — ROPIVACAINE HCL 7.5 MG/ML IJ SOLN
INTRAMUSCULAR | Status: DC | PRN
  Administered 2019-02-02: 20 mL via PERINEURAL

## 2019-02-02 MED ORDER — INSULIN ASPART 100 UNIT/ML ~~LOC~~ SOLN
0.0000 [IU] | Freq: Three times a day (TID) | SUBCUTANEOUS | Status: DC
  Administered 2019-02-02: 18:00:00 7 [IU] via SUBCUTANEOUS

## 2019-02-02 MED ORDER — CEFAZOLIN SODIUM-DEXTROSE 2-4 GM/100ML-% IV SOLN
2.0000 g | Freq: Four times a day (QID) | INTRAVENOUS | Status: AC
  Administered 2019-02-02 (×2): 2 g via INTRAVENOUS
  Filled 2019-02-02 (×2): qty 100

## 2019-02-02 MED ORDER — OXYCODONE HCL 5 MG/5ML PO SOLN: 5.0000 mg | Freq: Once | ORAL | Status: DC | PRN

## 2019-02-02 MED ORDER — POVIDONE-IODINE 10 % EX SWAB: 2.0000 "application " | Freq: Once | CUTANEOUS | Status: DC

## 2019-02-02 MED ORDER — HYDROMORPHONE HCL 1 MG/ML IJ SOLN
0.5000 mg | INTRAMUSCULAR | Status: DC | PRN
  Administered 2019-02-02 (×2): 0.5 mg via INTRAVENOUS

## 2019-02-02 MED ORDER — CHLORHEXIDINE GLUCONATE 4 % EX LIQD: 60.0000 mL | Freq: Once | CUTANEOUS | Status: DC

## 2019-02-02 MED ORDER — ACETAMINOPHEN 10 MG/ML IV SOLN
1000.0000 mg | INTRAVENOUS | Status: AC
  Administered 2019-02-02: 09:00:00 1000 mg via INTRAVENOUS
  Filled 2019-02-02: qty 100

## 2019-02-02 MED ORDER — SODIUM CHLORIDE (PF) 0.9 % IJ SOLN
INTRAMUSCULAR | Status: AC
  Filled 2019-02-02: qty 50

## 2019-02-02 MED ORDER — DEXAMETHASONE SODIUM PHOSPHATE 10 MG/ML IJ SOLN
INTRAMUSCULAR | Status: DC | PRN
  Administered 2019-02-02: 4 mg via INTRAVENOUS

## 2019-02-02 MED ORDER — FENTANYL CITRATE (PF) 100 MCG/2ML IJ SOLN
50.0000 ug | INTRAMUSCULAR | Status: DC
  Administered 2019-02-02: 08:00:00 50 ug via INTRAVENOUS
  Filled 2019-02-02: qty 2

## 2019-02-02 MED ORDER — ALUM & MAG HYDROXIDE-SIMETH 200-200-20 MG/5ML PO SUSP: 30.0000 mL | ORAL | Status: DC | PRN

## 2019-02-02 MED ORDER — KETOROLAC TROMETHAMINE 30 MG/ML IJ SOLN
INTRAMUSCULAR | Status: DC | PRN
  Administered 2019-02-02: 30 mg via INTRAVENOUS

## 2019-02-02 MED ORDER — ISOPROPYL ALCOHOL 70 % SOLN
Status: DC | PRN
  Administered 2019-02-02: 1 via TOPICAL

## 2019-02-02 MED ORDER — EPHEDRINE SULFATE-NACL 50-0.9 MG/10ML-% IV SOSY
PREFILLED_SYRINGE | INTRAVENOUS | Status: DC | PRN
  Administered 2019-02-02: 5 mg via INTRAVENOUS
  Administered 2019-02-02: 10 mg via INTRAVENOUS

## 2019-02-02 MED ORDER — BUPIVACAINE IN DEXTROSE 0.75-8.25 % IT SOLN
INTRATHECAL | Status: DC | PRN
  Administered 2019-02-02: 1.6 mL via INTRATHECAL

## 2019-02-02 MED ORDER — METOCLOPRAMIDE HCL 5 MG/ML IJ SOLN: 5.0000 mg | Freq: Three times a day (TID) | INTRAMUSCULAR | Status: DC | PRN

## 2019-02-02 MED ORDER — BUPIVACAINE HCL (PF) 0.25 % IJ SOLN
INTRAMUSCULAR | Status: AC
  Filled 2019-02-02: qty 30

## 2019-02-02 MED ORDER — ALBUTEROL SULFATE (2.5 MG/3ML) 0.083% IN NEBU: 2.5000 mg | INHALATION_SOLUTION | Freq: Four times a day (QID) | RESPIRATORY_TRACT | Status: DC | PRN

## 2019-02-02 MED ORDER — METHOCARBAMOL 500 MG PO TABS
500.0000 mg | ORAL_TABLET | Freq: Four times a day (QID) | ORAL | Status: DC | PRN
  Administered 2019-02-02 – 2019-02-03 (×3): 500 mg via ORAL
  Filled 2019-02-02 (×3): qty 1

## 2019-02-02 MED ORDER — OXYCODONE HCL 5 MG PO TABS: 5.0000 mg | ORAL_TABLET | Freq: Once | ORAL | Status: DC | PRN

## 2019-02-02 MED ORDER — OXYCODONE HCL 5 MG PO TABS
10.0000 mg | ORAL_TABLET | ORAL | Status: DC | PRN
  Administered 2019-02-03: 10 mg via ORAL
  Filled 2019-02-02: qty 2

## 2019-02-02 MED ORDER — PHENOL 1.4 % MT LIQD
1.0000 | OROMUCOSAL | Status: DC | PRN
  Filled 2019-02-02: qty 177

## 2019-02-02 MED ORDER — ONDANSETRON HCL 4 MG/2ML IJ SOLN: 4.0000 mg | Freq: Four times a day (QID) | INTRAMUSCULAR | Status: DC | PRN

## 2019-02-02 MED ORDER — INSULIN ASPART 100 UNIT/ML ~~LOC~~ SOLN
0.0000 [IU] | Freq: Every day | SUBCUTANEOUS | Status: DC
  Administered 2019-02-02: 23:00:00 5 [IU] via SUBCUTANEOUS

## 2019-02-02 MED ORDER — AMLODIPINE BESYLATE 10 MG PO TABS
10.0000 mg | ORAL_TABLET | Freq: Every day | ORAL | Status: DC
  Administered 2019-02-02 – 2019-02-03 (×2): 10 mg via ORAL
  Filled 2019-02-02 (×2): qty 1

## 2019-02-02 MED ORDER — FENTANYL CITRATE (PF) 100 MCG/2ML IJ SOLN
25.0000 ug | INTRAMUSCULAR | Status: DC | PRN
  Administered 2019-02-02 (×3): 50 ug via INTRAVENOUS

## 2019-02-02 MED ORDER — CELECOXIB 200 MG PO CAPS
200.0000 mg | ORAL_CAPSULE | Freq: Two times a day (BID) | ORAL | Status: DC
  Administered 2019-02-02 – 2019-02-03 (×2): 200 mg via ORAL
  Filled 2019-02-02 (×2): qty 1

## 2019-02-02 MED ORDER — HYDROMORPHONE HCL 1 MG/ML IJ SOLN: 0.5000 mg | INTRAMUSCULAR | Status: DC | PRN

## 2019-02-02 MED ORDER — LIDOCAINE HCL (CARDIAC) PF 100 MG/5ML IV SOSY
PREFILLED_SYRINGE | INTRAVENOUS | Status: DC | PRN
  Administered 2019-02-02: 80 mg via INTRAVENOUS

## 2019-02-02 MED ORDER — FLUTICASONE PROPIONATE 50 MCG/ACT NA SUSP
1.0000 | Freq: Every day | NASAL | Status: DC | PRN
  Filled 2019-02-02: qty 16

## 2019-02-02 MED ORDER — LEVOTHYROXINE SODIUM 25 MCG PO TABS
137.0000 ug | ORAL_TABLET | Freq: Every day | ORAL | Status: DC
  Administered 2019-02-03: 137 ug via ORAL
  Filled 2019-02-02: qty 1

## 2019-02-02 MED ORDER — SODIUM CHLORIDE 0.9 % IR SOLN
Status: DC | PRN
  Administered 2019-02-02: 1000 mL

## 2019-02-02 MED ORDER — TRANEXAMIC ACID-NACL 1000-0.7 MG/100ML-% IV SOLN
1000.0000 mg | INTRAVENOUS | Status: AC
  Administered 2019-02-02: 09:00:00 1000 mg via INTRAVENOUS
  Filled 2019-02-02: qty 100

## 2019-02-02 MED ORDER — POVIDONE-IODINE 10 % EX SWAB
2.0000 "application " | Freq: Once | CUTANEOUS | Status: AC
  Administered 2019-02-02: 2 via TOPICAL

## 2019-02-02 MED ORDER — LACTATED RINGERS IV SOLN
INTRAVENOUS | Status: DC | PRN
  Administered 2019-02-02 (×2): via INTRAVENOUS

## 2019-02-02 MED ORDER — POLYETHYLENE GLYCOL 3350 17 G PO PACK: 17.0000 g | PACK | Freq: Every day | ORAL | Status: DC | PRN

## 2019-02-02 MED ORDER — MIDAZOLAM HCL 2 MG/2ML IJ SOLN
1.0000 mg | INTRAMUSCULAR | Status: DC
  Administered 2019-02-02: 08:00:00 2 mg via INTRAVENOUS
  Filled 2019-02-02: qty 2

## 2019-02-02 MED ORDER — STERILE WATER FOR IRRIGATION IR SOLN
Status: DC | PRN
  Administered 2019-02-02: 3000 mL

## 2019-02-02 MED ORDER — PROMETHAZINE HCL 25 MG/ML IJ SOLN: 6.2500 mg | INTRAMUSCULAR | Status: DC | PRN

## 2019-02-02 MED ORDER — PROPOFOL 500 MG/50ML IV EMUL
INTRAVENOUS | Status: DC | PRN
  Administered 2019-02-02: 75 ug/kg/min via INTRAVENOUS

## 2019-02-02 MED ORDER — CLONAZEPAM 1 MG PO TABS: 1.0000 mg | ORAL_TABLET | Freq: Two times a day (BID) | ORAL | Status: DC | PRN

## 2019-02-02 MED ORDER — 0.9 % SODIUM CHLORIDE (POUR BTL) OPTIME
TOPICAL | Status: DC | PRN
  Administered 2019-02-02: 10:00:00 1000 mL

## 2019-02-02 SURGICAL SUPPLY — 71 items
BAG ZIPLOCK 12X15 (MISCELLANEOUS) IMPLANT
BATTERY INSTRU NAVIGATION (MISCELLANEOUS) ×9 IMPLANT
BLADE SAW RECIPROCATING 77.5 (BLADE) ×3 IMPLANT
BNDG ELASTIC 4X5.8 VLCR STR LF (GAUZE/BANDAGES/DRESSINGS) ×3 IMPLANT
BNDG ELASTIC 6X5.8 VLCR STR LF (GAUZE/BANDAGES/DRESSINGS) ×3 IMPLANT
BONE CEMENT SIMPLEX TOBRAMYCIN (Cement) ×4 IMPLANT
CEMENT BONE SIMPLEX TOBRAMYCIN (Cement) ×2 IMPLANT
CHLORAPREP W/TINT 26 (MISCELLANEOUS) ×6 IMPLANT
COMPONENT TRI CR RETAIN KNEE (Orthopedic Implant) IMPLANT
COVER SURGICAL LIGHT HANDLE (MISCELLANEOUS) ×3 IMPLANT
COVER WAND RF STERILE (DRAPES) ×3 IMPLANT
CUFF TOURN SGL QUICK 34 (TOURNIQUET CUFF) ×2
CUFF TRNQT CYL 34X4.125X (TOURNIQUET CUFF) ×1 IMPLANT
DECANTER SPIKE VIAL GLASS SM (MISCELLANEOUS) ×6 IMPLANT
DERMABOND ADVANCED (GAUZE/BANDAGES/DRESSINGS) ×2
DERMABOND ADVANCED .7 DNX12 (GAUZE/BANDAGES/DRESSINGS) ×1 IMPLANT
DRAPE SHEET LG 3/4 BI-LAMINATE (DRAPES) ×9 IMPLANT
DRAPE U-SHAPE 47X51 STRL (DRAPES) ×3 IMPLANT
DRSG AQUACEL AG ADV 3.5X10 (GAUZE/BANDAGES/DRESSINGS) ×3 IMPLANT
DRSG TEGADERM 4X4.75 (GAUZE/BANDAGES/DRESSINGS) IMPLANT
ELECT BLADE TIP CTD 4 INCH (ELECTRODE) ×3 IMPLANT
ELECT REM PT RETURN 15FT ADLT (MISCELLANEOUS) ×3 IMPLANT
EVACUATOR 1/8 PVC DRAIN (DRAIN) IMPLANT
FEMORAL CRUCIATE RETAINED (Knees) ×3 IMPLANT
GAUZE SPONGE 4X4 12PLY STRL (GAUZE/BANDAGES/DRESSINGS) ×3 IMPLANT
GLOVE BIO SURGEON STRL SZ8.5 (GLOVE) ×6 IMPLANT
GLOVE BIOGEL PI IND STRL 8.5 (GLOVE) ×1 IMPLANT
GLOVE BIOGEL PI INDICATOR 8.5 (GLOVE) ×2
GOWN SPEC L3 XXLG W/TWL (GOWN DISPOSABLE) ×3 IMPLANT
HANDPIECE INTERPULSE COAX TIP (DISPOSABLE) ×2
HOLDER FOLEY CATH W/STRAP (MISCELLANEOUS) ×3 IMPLANT
HOOD PEEL AWAY FLYTE STAYCOOL (MISCELLANEOUS) ×9 IMPLANT
INSERT TIB BEAR SZ3 9 KNEE (Miscellaneous) IMPLANT
INSERT TIBIA KNEE SZ 3 9MM (Insert) ×3 IMPLANT
JET LAVAGE IRRISEPT WOUND (IRRIGATION / IRRIGATOR) ×3
KIT TURNOVER KIT A (KITS) IMPLANT
KNEE PATELLA ASYMMETRIC 9X29 (Knees) ×3 IMPLANT
KNEE TIBIAL COMPONENT SZ3 (Knees) ×3 IMPLANT
LAVAGE JET IRRISEPT WOUND (IRRIGATION / IRRIGATOR) ×1 IMPLANT
MARKER SKIN DUAL TIP RULER LAB (MISCELLANEOUS) ×3 IMPLANT
NDL SAFETY ECLIPSE 18X1.5 (NEEDLE) ×1 IMPLANT
NEEDLE HYPO 18GX1.5 SHARP (NEEDLE) ×2
NEEDLE SPNL 18GX3.5 QUINCKE PK (NEEDLE) ×3 IMPLANT
NS IRRIG 1000ML POUR BTL (IV SOLUTION) ×3 IMPLANT
PACK TOTAL KNEE CUSTOM (KITS) ×3 IMPLANT
PADDING CAST COTTON 6X4 STRL (CAST SUPPLIES) ×3 IMPLANT
PIN FLUTED HEDLESS FIX 3.5X1/8 (PIN) ×6 IMPLANT
PROTECTOR NERVE ULNAR (MISCELLANEOUS) ×3 IMPLANT
SAW OSC TIP CART 19.5X105X1.3 (SAW) ×3 IMPLANT
SEALER BIPOLAR AQUA 6.0 (INSTRUMENTS) ×3 IMPLANT
SET HNDPC FAN SPRY TIP SCT (DISPOSABLE) ×1 IMPLANT
SET PAD KNEE POSITIONER (MISCELLANEOUS) ×3 IMPLANT
SPONGE DRAIN TRACH 4X4 STRL 2S (GAUZE/BANDAGES/DRESSINGS) IMPLANT
SPONGE LAP 18X18 RF (DISPOSABLE) IMPLANT
SUT MNCRL AB 3-0 PS2 18 (SUTURE) ×3 IMPLANT
SUT MNCRL AB 4-0 PS2 18 (SUTURE) ×3 IMPLANT
SUT MON AB 2-0 CT1 36 (SUTURE) ×6 IMPLANT
SUT STRATAFIX PDO 1 14 VIOLET (SUTURE) ×2
SUT STRATFX PDO 1 14 VIOLET (SUTURE) ×1
SUT VIC AB 1 CTX 36 (SUTURE) ×4
SUT VIC AB 1 CTX36XBRD ANBCTR (SUTURE) ×2 IMPLANT
SUT VIC AB 2-0 CT1 27 (SUTURE) ×2
SUT VIC AB 2-0 CT1 TAPERPNT 27 (SUTURE) ×1 IMPLANT
SUTURE STRATFX PDO 1 14 VIOLET (SUTURE) ×1 IMPLANT
SYR 3ML LL SCALE MARK (SYRINGE) ×3 IMPLANT
TOWER CARTRIDGE SMART MIX (DISPOSABLE) ×3 IMPLANT
TRAY FOLEY MTR SLVR 14FR STAT (SET/KITS/TRAYS/PACK) ×3 IMPLANT
TRIA CRUCIATE RETAIN KNEE (Orthopedic Implant) IMPLANT
WATER STERILE IRR 1000ML POUR (IV SOLUTION) ×6 IMPLANT
WRAP KNEE MAXI GEL POST OP (GAUZE/BANDAGES/DRESSINGS) ×3 IMPLANT
YANKAUER SUCT BULB TIP 10FT TU (MISCELLANEOUS) ×3 IMPLANT

## 2019-02-02 NOTE — Transfer of Care (Signed)
Immediate Anesthesia Transfer of Care Note  Patient: Paula Hall  Procedure(s) Performed: COMPUTER ASSISTED TOTAL KNEE ARTHROPLASTY (Right Knee)  Patient Location: PACU  Anesthesia Type:MAC and Spinal  Level of Consciousness: awake, alert , oriented and patient cooperative  Airway & Oxygen Therapy: Patient Spontanous Breathing and Patient connected to face mask oxygen  Post-op Assessment: Report given to RN, Post -op Vital signs reviewed and stable and Patient moving all extremities  Post vital signs: Reviewed and stable  Last Vitals:  Vitals Value Taken Time  BP 134/79 02/02/19 1208  Temp    Pulse 59 02/02/19 1213  Resp 17 02/02/19 1213  SpO2 100 % 02/02/19 1213  Vitals shown include unvalidated device data.  Last Pain:  Vitals:   02/02/19 0642  TempSrc:   PainSc: 4       Patients Stated Pain Goal: 5 (44/46/19 0122)  Complications: No apparent anesthesia complications

## 2019-02-02 NOTE — Op Note (Signed)
OPERATIVE REPORT  SURGEON: Rod Can, MD   ASSISTANT: Nehemiah Massed, PA-C.  PREOPERATIVE DIAGNOSIS: Right knee arthritis.   POSTOPERATIVE DIAGNOSIS: Right knee arthritis.   PROCEDURE: Right total knee arthroplasty.   IMPLANTS: Stryker Triathlon CR femur, size 3. Stryker Tritanium tibia, size 3. X3 polyethelyene insert, size 9 mm, CR. 3 button asymmetric patella, size 29 mm. Simplex P bone cement.  ANESTHESIA:  MAC, Regional and Spinal  TOURNIQUET TIME: Not utilized.   ESTIMATED BLOOD LOSS:-250 mL    ANTIBIOTICS: 2 g Ancef.  DRAINS: None.  COMPLICATIONS: None   CONDITION: PACU - hemodynamically stable.   BRIEF CLINICAL NOTE: Paula Hall is a 58 y.o. female with a long-standing history of Right knee arthritis. After failing conservative management, the patient was indicated for total knee arthroplasty. The risks, benefits, and alternatives to the procedure were explained, and the patient elected to proceed.  PROCEDURE IN DETAIL: Adductor canal block was obtained in the pre-op holding area. Once inside the operative room, spinal anesthesia was obtained, and a foley catheter was inserted. The patient was then positioned, a nonsterile tourniquet was placed, and the lower extremity was prepped and draped in the normal sterile surgical fashion.  A time-out was called verifying side and site of surgery. The patient received IV antibiotics within 60 minutes of beginning the procedure. The tourniquet was not utilized.   An anterior approach to the knee was performed utilizing a midvastus arthrotomy. A medial release was performed and the patellar fat pad was excised. Stryker navigation was used to cut the distal femur perpendicular to the mechanical axis. A freehand patellar resection was performed, and the patella was sized an prepared with 3 lug holes.  Nagivation was used to make  a neutral proximal tibia resection, taking 8 mm of bone from the less affected lateral side with 3 degrees of slope. The menisci were excised. A spacer block was placed, and the alignment and balance in extension were confirmed.   The distal femur was sized using the 3-degree external rotation guide referencing the posterior femoral cortex. The appropriate 4-in-1 cutting block was pinned into place. Rotation was checked using Whiteside's line, the epicondylar axis, and then confirmed with a spacer block in flexion. The remaining femoral cuts were performed, taking care to protect the MCL.  The tibia was sized and the trial tray was pinned into place. The remaining trail components were inserted. The knee was stable to varus and valgus stress through a full range of motion. The patella tracked centrally, and the PCL was well balanced. The trial components were removed, and the proximal tibial surface was prepared. Final tibial component was impacted into place.  As the femoral component was about to be impacted, I noticed that the anterior femoral cut had some impaction of the bone.  No fracture was noticed.  Therefore, I elected to cement the femoral component.  The tourniquet was elevated to 320 mmHg.  The distal femur was irrigated with pulse lavage and dried with a sponge.  The femoral component was cemented.  Excess cement was cleared.  Trial polyethylene liner was placed and the knee was brought into extension while the cement polymerized.  Once the cement was hard, the trial liner was exchanged for the real liner.  The real patellar component was press-fit into place. The knee was tested for a final time and found to be well balanced.   The wound was copiously irrigated with Irrisept solution and normal saline using pule lavage.  Marcaine solution  was injected into the periarticular soft tissue.  The wound was closed in layers using #1 Vicryl and Stratafix for the fascia, 2-0 Vicryl for the subcutaneous  fat, 2-0 Monocryl for the deep dermal layer, 3-0 running Monocryl subcuticular Stitch, and 4-0 Monocryl stay sutures at both ends of the wound. Dermabond was applied to the skin.  Once the glue was fully dried, an Aquacell Ag and compressive dressing were applied.  Tthe patient was transported to the recovery room in stable condition.  Sponge, needle, and instrument counts were correct at the end of the case x2.  The patient tolerated the procedure well and there were no known complications.  Please note that a surgical assistant was a medical necessity for this procedure in order to perform it in a safe and expeditious manner. Surgical assistant was necessary to retract the ligaments and vital neurovascular structures to prevent injury to them and also necessary for proper positioning of the limb to allow for anatomic placement of the prosthesis.

## 2019-02-02 NOTE — Anesthesia Procedure Notes (Signed)
Spinal  Patient location during procedure: OR Start time: 02/02/2019 8:35 AM End time: 02/02/2019 8:39 AM Staffing Anesthesiologist: Audry Pili, MD Performed: anesthesiologist  Preanesthetic Checklist Completed: patient identified, surgical consent, pre-op evaluation, timeout performed, IV checked, risks and benefits discussed and monitors and equipment checked Spinal Block Patient position: sitting Prep: DuraPrep Patient monitoring: heart rate, cardiac monitor, continuous pulse ox and blood pressure Approach: midline Location: L3-4 Injection technique: single-shot Needle Needle type: Pencan  Needle gauge: 24 G Additional Notes Consent was obtained prior to the procedure with all questions answered and concerns addressed. Risks including, but not limited to, bleeding, infection, nerve damage, paralysis, failed block, inadequate analgesia, allergic reaction, high spinal, itching, and headache were discussed and the patient wished to proceed. Functioning IV was confirmed and monitors were applied. Sterile prep and drape, including hand hygiene, mask, and sterile gloves were used. The patient was positioned and the spine was prepped. The skin was anesthetized with lidocaine. Free flow of clear CSF was obtained prior to injecting local anesthetic into the CSF. The spinal needle aspirated freely following injection. The needle was carefully withdrawn. The patient tolerated the procedure well.   Renold Don, MD

## 2019-02-02 NOTE — Plan of Care (Signed)
done

## 2019-02-02 NOTE — Discharge Instructions (Signed)
° °Dr. Jeorgia Helming °Total Joint Specialist °[facility] °[address] °[place], [place] [address] °[phone] ° °TOTAL KNEE REPLACEMENT POSTOPERATIVE DIRECTIONS ° ° ° °Knee Rehabilitation, Guidelines Following Surgery  °Results after knee surgery are often greatly improved when you follow the exercise, range of motion and muscle strengthening exercises prescribed by your doctor. Safety measures are also important to protect the knee from further injury. Any time any of these exercises cause you to have increased pain or swelling in your knee joint, decrease the amount until you are comfortable again and slowly increase them. If you have problems or questions, call your caregiver or physical therapist for advice.  ° °WEIGHT BEARING °Weight bearing as tolerated with assist device (walker, cane, etc) as directed, use it as long as suggested by your surgeon or therapist, typically at least 4-6 weeks. ° °HOME CARE INSTRUCTIONS  °Remove items at home which could result in a fall. This includes throw rugs or furniture in walking pathways.  °Continue medications as instructed at time of discharge. °You may have some home medications which will be placed on hold until you complete the course of blood thinner medication.  °You may start showering once you are discharged home but do not submerge the incision under water. Just pat the incision dry and apply a dry gauze dressing on daily. °Walk with walker as instructed.  °You may resume a sexual relationship in one month or when given the OK by your doctor.  °· Use walker as long as suggested by your caregivers. °· Avoid periods of inactivity such as sitting longer than an hour when not asleep. This helps prevent blood clots.  °You may put full weight on your legs and walk as much as is comfortable.  °You may return to work once you are cleared by your doctor.  °Do not drive a car for 6 weeks or until released by you surgeon.  °· Do not drive  while taking narcotics.  °Wear the elastic stockings for three weeks following surgery during the day but you may remove then at night. °Make sure you keep all of your appointments after your operation with all of your doctors and caregivers. You should call the office at the above phone number and make an appointment for approximately two weeks after the date of your surgery. °Do not remove your surgical dressing. The dressing is waterproof; you may take showers in 3 days, but do not take tub baths or submerge the dressing. °Please pick up a stool softener and laxative for home use as long as you are requiring pain medications. °· ICE to the affected knee every three hours for 30 minutes at a time and then as needed for pain and swelling.  Continue to use ice on the knee for pain and swelling from surgery. You may notice swelling that will progress down to the foot and ankle.  This is normal after surgery.  Elevate the leg when you are not up walking on it.   °It is important for you to complete the blood thinner medication as prescribed by your doctor. °· Continue to use the breathing machine which will help keep your temperature down.  It is common for your temperature to cycle up and down following surgery, especially at night when you are not up moving around and exerting yourself.  The breathing machine keeps your lungs expanded and your temperature down. ° °RANGE OF MOTION AND STRENGTHENING EXERCISES  °Rehabilitation of the knee is important following   a knee injury or an operation. After just a few days of immobilization, the muscles of the thigh which control the knee become weakened and shrink (atrophy). Knee exercises are designed to build up the tone and strength of the thigh muscles and to improve knee motion. Often times heat used for twenty to thirty minutes before working out will loosen up your tissues and help with improving the range of motion but do not use heat for the first two weeks following  surgery. These exercises can be done on a training (exercise) mat, on the floor, on a table or on a bed. Use what ever works the best and is most comfortable for you Knee exercises include:  °Leg Lifts - While your knee is still immobilized in a splint or cast, you can do straight leg raises. Lift the leg to 60 degrees, hold for 3 sec, and slowly lower the leg. Repeat 10-20 times 2-3 times daily. Perform this exercise against resistance later as your knee gets better.  °Quad and Hamstring Sets - Tighten up the muscle on the front of the thigh (Quad) and hold for 5-10 sec. Repeat this 10-20 times hourly. Hamstring sets are done by pushing the foot backward against an object and holding for 5-10 sec. Repeat as with quad sets.  °A rehabilitation program following serious knee injuries can speed recovery and prevent re-injury in the future due to weakened muscles. Contact your doctor or a physical therapist for more information on knee rehabilitation.  ° °SKILLED REHAB INSTRUCTIONS: °If the patient is transferred to a skilled rehab facility following release from the hospital, a list of the current medications will be sent to the facility for the patient to continue.  When discharged from the skilled rehab facility, please have the facility set up the patient's Home Health Physical Therapy prior to being released. Also, the skilled facility will be responsible for providing the patient with their medications at time of release from the facility to include their pain medication, the muscle relaxants, and their blood thinner medication. If the patient is still at the rehab facility at time of the two week follow up appointment, the skilled rehab facility will also need to assist the patient in arranging follow up appointment in our office and any transportation needs. ° °MAKE SURE YOU:  °Understand these instructions.  °Will watch your condition.  °Will get help right away if you are not doing well or get worse.   ° ° °Pick up stool softner and laxative for home use following surgery while on pain medications. °Do NOT remove your dressing. You may shower.  °Do not take tub baths or submerge incision under water. °May shower starting three days after surgery. °Please use a clean towel to pat the incision dry following showers. °Continue to use ice for pain and swelling after surgery. °Do not use any lotions or creams on the incision until instructed by your surgeon. ° °

## 2019-02-02 NOTE — Evaluation (Signed)
Physical Therapy Evaluation Patient Details Name: Paula Hall MRN: [id number] DOB: 1960/05/16 Today's Date: 02/02/2019   History of Present Illness  Patient is 58 y.o. female s/p Rt TKA on 02/02/19 with PMH significant for CVA, HTN, GERD, DM, and arthritis.  Clinical Impression  Paula Hall is a 58 y.o. female POD 0 s/p Rt TKA. Patient reports  with mobility at baseline. Patient is now limited by functional impairments (see PT problem list below) and requires min assist for transfers and gait with RW. Patient was able to ambulate ~75 feet with RW and intermittent cues for safety. Patient instructed in exercise to facilitate circulation to manage edema. Patient will benefit from continued skilled PT interventions to address impairments and progress towards PLOF. Acute PT will follow to progress mobility and stair training in preparation for safe discharge home.     Follow Up Recommendations Follow surgeon's recommendation for DC plan and follow-up therapies    Equipment Recommendations  Rolling walker with 5" wheels    Recommendations for Other Services       Precautions / Restrictions Precautions Precautions: Fall Restrictions Weight Bearing Restrictions: (P) No      Mobility  Bed Mobility Overal bed mobility: Needs Assistance Bed Mobility: Supine to Sit     Supine to sit: Min assist;HOB elevated     General bed mobility comments: assist for Rt LE, pt using bed rails and HOB elevated  Transfers Overall transfer level: Needs assistance Equipment used: Rolling walker (2 wheeled) Transfers: Sit to/from Stand Sit to Stand: Min assist         General transfer comment: min assist to initiate rise and cues for hand placement and technique  Ambulation/Gait   Gait Distance (Feet): 75 Feet Assistive device: Rolling walker (2 wheeled) Gait Pattern/deviations: Step-through pattern;Decreased step length - right;Decreased stride length;Decreased stance time -  right;Antalgic;Decreased weight shift to right Gait velocity: decreased   General Gait Details: pt required cues for posture and to maintain safe proximity to walker throughout  Stairs            Wheelchair Mobility    Modified Rankin (Stroke Patients Only)       Balance Overall balance assessment: Needs assistance Sitting-balance support: Feet supported;No upper extremity supported Sitting balance-Leahy Scale: Good     Standing balance support: During functional activity;Bilateral upper extremity supported Standing balance-Leahy Scale: Fair              Pertinent Vitals/Pain Pain Assessment: Faces Faces Pain Scale: Hurts little more Pain Location: Rt knee Pain Descriptors / Indicators: Guarding;Grimacing Pain Intervention(s): Limited activity within patient's tolerance;Monitored during session    Paula Hall expects to be discharged to:: Private residence Living Arrangements: Alone Available Help at Discharge: Family(pt's neice lives with her and her sisters are coming to help) Type of Home: House Home Access: Stairs to enter Entrance Stairs-Rails: Left Entrance Stairs-Number of Steps: 2 steps going down with rail on Lt Home Layout: One level Home Equipment: Cane - single point;Shower seat      Prior Function Level of Independence: Independent with assistive device(s)         Comments: pt was mobilizing with SPC     Hand Dominance        Extremity/Trunk Assessment   Upper Extremity Assessment Upper Extremity Assessment: Overall WFL for tasks assessed    Lower Extremity Assessment Lower Extremity Assessment: Generalized weakness;RLE deficits/detail RLE: Unable to fully assess due to pain       Communication  Communication: No difficulties  Cognition Arousal/Alertness: Awake/alert Behavior During Therapy: WFL for tasks assessed/performed Overall Cognitive Status: Within Functional Limits for tasks assessed                      General Comments      Exercises Total Joint Exercises Ankle Circles/Pumps: AROM;Seated;Both;10 reps Quad Sets: AROM;5 reps;Seated   Assessment/Plan    PT Assessment Patient needs continued PT services  PT Problem List Decreased strength;Decreased balance;Decreased range of motion;Decreased mobility;Decreased coordination;Decreased activity tolerance       PT Treatment Interventions DME instruction;Functional mobility training;Balance training;Modalities;Patient/family education;Gait training;Therapeutic activities;Therapeutic exercise;Stair training    PT Goals (Current goals can be found in the Care Plan section)  Acute Rehab PT Goals Patient Stated Goal: to return home PT Goal Formulation: With patient Time For Goal Achievement: 02/09/19 Potential to Achieve Goals: Good    Frequency 7X/week    AM-PAC PT "6 Clicks" Mobility  Outcome Measure Help needed turning from your back to your side while in a flat bed without using bedrails?: A Little Help needed moving from lying on your back to sitting on the side of a flat bed without using bedrails?: A Little Help needed moving to and from a bed to a chair (including a wheelchair)?: A Little Help needed standing up from a chair using your arms (e.g., wheelchair or bedside chair)?: A Little Help needed to walk in hospital room?: A Little Help needed climbing 3-5 steps with a railing? : A Little 6 Click Score: 18    End of Session Equipment Utilized During Treatment: Gait belt Activity Tolerance: Patient tolerated treatment well Patient left: in chair;with call bell/phone within reach;with chair alarm set Nurse Communication: Mobility status PT Visit Diagnosis: Other abnormalities of gait and mobility (R26.89);Muscle weakness (generalized) (M62.81);Unsteadiness on feet (R26.81);Difficulty in walking, not elsewhere classified (R26.2)    Time: [id number] PT Time Calculation (min) (ACUTE ONLY): 32 min   Charges:    PT Evaluation $PT Eval Low Complexity: 1 Low PT Treatments $Gait Training: 8-22 mins        Kipp Brood, PT, DPT, [facility] Physical Therapist with [facility]  02/02/2019 7:46 PM

## 2019-02-02 NOTE — Anesthesia Postprocedure Evaluation (Signed)
Anesthesia Post Note  Patient: Paula Hall  Procedure(s) Performed: COMPUTER ASSISTED TOTAL KNEE ARTHROPLASTY (Right Knee)     Patient location during evaluation: PACU Anesthesia Type: Spinal Level of consciousness: awake and alert Pain management: pain level controlled Vital Signs Assessment: post-procedure vital signs reviewed and stable Respiratory status: spontaneous breathing, respiratory function stable and patient connected to nasal cannula oxygen Cardiovascular status: blood pressure returned to baseline and stable Postop Assessment: spinal receding and no apparent nausea or vomiting Anesthetic complications: no    Last Vitals:  Vitals:   02/02/19 1330 02/02/19 1343  BP: 121/70 127/67  Pulse: (!) 59 (!) 51  Resp: 12 14  Temp: 36.6 C 36.9 C  SpO2: 99% 98%    Last Pain:  Vitals:   02/02/19 1343  TempSrc: Oral  PainSc:                  Audry Pili

## 2019-02-02 NOTE — Progress Notes (Signed)
Assisted Dr. Brock with right, ultrasound guided, adductor canal block. Side rails up, monitors on throughout procedure. See vital signs in flow sheet. Tolerated Procedure well.  

## 2019-02-02 NOTE — Interval H&P Note (Signed)
History and Physical Interval Note:  02/02/2019 8:25 AM  Paula Hall  has presented today for surgery, with the diagnosis of Degenerative joint disease right knee.  The various methods of treatment have been discussed with the patient and family. After consideration of risks, benefits and other options for treatment, the patient has consented to  Procedure(s): COMPUTER ASSISTED TOTAL KNEE ARTHROPLASTY (Right) as a surgical intervention.  The patient's history has been reviewed, patient examined, no change in status, stable for surgery.  I have reviewed the patient's chart and labs.  Questions were answered to the patient's satisfaction.     Hilton Cork Anav Lammert

## 2019-02-02 NOTE — Anesthesia Procedure Notes (Signed)
Anesthesia Regional Block: Adductor canal block   Pre-Anesthetic Checklist: ,, timeout performed, Correct Patient, Correct Site, Correct Laterality, Correct Procedure, Correct Position, site marked, Risks and benefits discussed,  Surgical consent,  Pre-op evaluation,  At surgeon's request and post-op pain management  Laterality: Right  Prep: chloraprep       Needles:  Injection technique: Single-shot  Needle Type: Echogenic Needle     Needle Length: 10cm  Needle Gauge: 21     Additional Needles:   Narrative:  Start time: 02/02/2019 8:10 AM End time: 02/02/2019 8:14 AM Injection made incrementally with aspirations every 5 mL.  Performed by: Personally  Anesthesiologist: Audry Pili, MD  Additional Notes: No pain on injection. No increased resistance to injection. Injection made in 5cc increments. Good needle visualization. Patient tolerated the procedure well.

## 2019-02-02 NOTE — Plan of Care (Signed)
Plan of care reviewed and discussed with the patient. 

## 2019-02-03 DIAGNOSIS — Z96651 Presence of right artificial knee joint: Secondary | ICD-10-CM | POA: Diagnosis not present

## 2019-02-03 DIAGNOSIS — E119 Type 2 diabetes mellitus without complications: Secondary | ICD-10-CM | POA: Diagnosis not present

## 2019-02-03 DIAGNOSIS — K219 Gastro-esophageal reflux disease without esophagitis: Secondary | ICD-10-CM | POA: Diagnosis not present

## 2019-02-03 DIAGNOSIS — Z8673 Personal history of transient ischemic attack (TIA), and cerebral infarction without residual deficits: Secondary | ICD-10-CM | POA: Diagnosis not present

## 2019-02-03 DIAGNOSIS — Z7984 Long term (current) use of oral hypoglycemic drugs: Secondary | ICD-10-CM | POA: Diagnosis not present

## 2019-02-03 DIAGNOSIS — I10 Essential (primary) hypertension: Secondary | ICD-10-CM | POA: Diagnosis not present

## 2019-02-03 DIAGNOSIS — G4730 Sleep apnea, unspecified: Secondary | ICD-10-CM | POA: Diagnosis not present

## 2019-02-03 DIAGNOSIS — M1711 Unilateral primary osteoarthritis, right knee: Secondary | ICD-10-CM | POA: Diagnosis not present

## 2019-02-03 LAB — BASIC METABOLIC PANEL
Anion gap: 8 (ref 5–15)
BUN: 15 mg/dL (ref 6–20)
CO2: 23 mmol/L (ref 22–32)
Calcium: 7.9 mg/dL — ABNORMAL LOW (ref 8.9–10.3)
Chloride: 108 mmol/L (ref 98–111)
Creatinine, Ser: 0.79 mg/dL (ref 0.44–1.00)
GFR calc Af Amer: >60 mL/min (ref >60)
GFR calc non Af Amer: >60 mL/min (ref >60)
Glucose, Bld: 92 mg/dL (ref 70–99)
Potassium: 4.2 mmol/L (ref 3.5–5.1)
Sodium: 139 mmol/L (ref 135–145)

## 2019-02-03 LAB — CBC
HCT: 30.6 % — ABNORMAL LOW (ref 36.0–46.0)
Hemoglobin: 9.7 g/dL — ABNORMAL LOW (ref 12.0–15.0)
MCH: 30.3 pg (ref 26.0–34.0)
MCHC: 31.7 g/dL (ref 30.0–36.0)
MCV: 95.6 fL (ref 80.0–100.0)
Platelets: 150 10*3/uL (ref 150–400)
RBC: 3.2 MIL/uL — ABNORMAL LOW (ref 3.87–5.11)
RDW: 13.6 % (ref 11.5–15.5)
WBC: 8.2 10*3/uL (ref 4.0–10.5)
nRBC: 0 % (ref 0.0–0.2)

## 2019-02-03 LAB — GLUCOSE, CAPILLARY
Glucose-Capillary: 115 mg/dL — ABNORMAL HIGH (ref 70–99)
Glucose-Capillary: 141 mg/dL — ABNORMAL HIGH (ref 70–99)

## 2019-02-03 MED ORDER — CELECOXIB 200 MG PO CAPS: 200.0000 mg | ORAL_CAPSULE | Freq: Every day | ORAL | 2 refills | Status: DC

## 2019-02-03 MED ORDER — OXYCODONE HCL 10 MG PO TABS: 10.0000 mg | ORAL_TABLET | ORAL | 0 refills | Status: AC | PRN

## 2019-02-03 MED ORDER — ASPIRIN 81 MG PO CHEW: 81.0000 mg | CHEWABLE_TABLET | Freq: Two times a day (BID) | ORAL | 1 refills | Status: AC

## 2019-02-03 MED ORDER — DOCUSATE SODIUM 100 MG PO CAPS: 100.0000 mg | ORAL_CAPSULE | Freq: Two times a day (BID) | ORAL | 1 refills | Status: AC

## 2019-02-03 MED ORDER — SENNA 8.6 MG PO TABS: 1.0000 | ORAL_TABLET | Freq: Two times a day (BID) | ORAL | 0 refills | Status: AC

## 2019-02-03 MED ORDER — ONDANSETRON HCL 4 MG PO TABS: 4.0000 mg | ORAL_TABLET | Freq: Four times a day (QID) | ORAL | 0 refills | Status: AC | PRN

## 2019-02-03 NOTE — Progress Notes (Signed)
Physical Therapy Treatment Patient Details Name: Paula Hall MRN: [id number] DOB: November 11, 1960 Today's Date: 02/03/2019    History of Present Illness Patient is 58 y.o. female s/p Rt TKA on 02/02/19 with PMH significant for CVA, HTN, GERD, DM, and arthritis.    PT Comments    Stair training completed. Pt is ready to DC home from PT standpoint.   Follow Up Recommendations  Follow surgeon's recommendation for DC plan and follow-up therapies     Equipment Recommendations  Rolling walker with 5" wheels    Recommendations for Other Services       Precautions / Restrictions Precautions Precautions: Fall;Knee Precaution Booklet Issued: Yes (comment) Precaution Comments: reviewed no pillow under knee Restrictions Weight Bearing Restrictions: No    Mobility  Bed Mobility Overal bed mobility: Modified Independent Bed Mobility: Supine to Sit     Supine to sit: HOB elevated;Modified independent (Device/Increase time)     General bed mobility comments: up in chair  Transfers Overall transfer level: Needs assistance Equipment used: Rolling walker (2 wheeled) Transfers: Sit to/from Stand Sit to Stand: Supervision         General transfer comment: verbal cues for hand placement and technique  Ambulation/Gait Ambulation/Gait assistance: Supervision Gait Distance (Feet): 60 Feet Assistive device: Rolling walker (2 wheeled) Gait Pattern/deviations: Step-through pattern;Decreased step length - right;Decreased stride length;Decreased stance time - right;Antalgic;Decreased weight shift to right Gait velocity: decreased   General Gait Details: pt required cues for sequencing, no loss of balance   Stairs Stairs: Yes Stairs assistance: Min guard Stair Management: One rail Left;Forwards;Step to pattern;With cane Number of Stairs: 5 General stair comments: VCs sequencing   Wheelchair Mobility    Modified Rankin (Stroke Patients Only)       Balance Overall balance  assessment: Needs assistance Sitting-balance support: Feet supported;No upper extremity supported Sitting balance-Leahy Scale: Good     Standing balance support: During functional activity;Bilateral upper extremity supported Standing balance-Leahy Scale: Fair                              Cognition Arousal/Alertness: Awake/alert Behavior During Therapy: WFL for tasks assessed/performed Overall Cognitive Status: Within Functional Limits for tasks assessed                                           General Comments        Pertinent Vitals/Pain Pain Score: 8  Pain Location: R knee Pain Descriptors / Indicators: Sore Pain Intervention(s): Monitored during session;Limited activity within patient's tolerance;Premedicated before session;Ice applied    Home Living                      Prior Function            PT Goals (current goals can now be found in the care plan section) Acute Rehab PT Goals Patient Stated Goal: to return home PT Goal Formulation: With patient Time For Goal Achievement: 02/09/19 Potential to Achieve Goals: Good Progress towards PT goals: Progressing toward goals    Frequency    7X/week      PT Plan Current plan remains appropriate    Co-evaluation              AM-PAC PT "6 Clicks" Mobility   Outcome Measure  Help needed turning from your back to your side  while in a flat bed without using bedrails?: A Little Help needed moving from lying on your back to sitting on the side of a flat bed without using bedrails?: A Little Help needed moving to and from a bed to a chair (including a wheelchair)?: None Help needed standing up from a chair using your arms (e.g., wheelchair or bedside chair)?: None Help needed to walk in hospital room?: None Help needed climbing 3-5 steps with a railing? : A Little 6 Click Score: 21    End of Session Equipment Utilized During Treatment: Gait belt Activity Tolerance:  Patient tolerated treatment well Patient left: in chair;with call bell/phone within reach;with chair alarm set Nurse Communication: Mobility status PT Visit Diagnosis: Other abnormalities of gait and mobility (R26.89);Muscle weakness (generalized) (M62.81);Unsteadiness on feet (R26.81);Difficulty in walking, not elsewhere classified (R26.2)     Time: [id number] PT Time Calculation (min) (ACUTE ONLY): 13 min  Charges:  $Gait Training: 8-22 mins       Blondell Reveal Kistler PT 02/03/2019  Acute Rehabilitation Services Pager [phone] Office [phone]

## 2019-02-03 NOTE — Progress Notes (Signed)
Physical Therapy Treatment Patient Details Name: Paula Hall MRN: [id number] DOB: 01/26/1961 Today's Date: 02/03/2019    History of Present Illness Patient is 58 y.o. female s/p Rt TKA on 02/02/19 with PMH significant for CVA, HTN, GERD, DM, and arthritis.    PT Comments    Pt ambulated 180' with RW and performed TKA HEP with min assist. Will return this morning to do stair training, pt requested a rest break and muscle relaxer prior to stair training. Pt is progressing well with mobility.    Follow Up Recommendations  Follow surgeon's recommendation for DC plan and follow-up therapies     Equipment Recommendations  Rolling walker with 5" wheels    Recommendations for Other Services       Precautions / Restrictions Precautions Precautions: Fall;Knee Precaution Booklet Issued: Yes (comment) Precaution Comments: reviewed no pillow under knee Restrictions Weight Bearing Restrictions: No    Mobility  Bed Mobility Overal bed mobility: Modified Independent Bed Mobility: Supine to Sit     Supine to sit: HOB elevated;Modified independent (Device/Increase time)     General bed mobility comments: pt using bed rails and HOB elevated  Transfers Overall transfer level: Needs assistance Equipment used: Rolling walker (2 wheeled) Transfers: Sit to/from Stand Sit to Stand: Supervision         General transfer comment: verbal cues for hand placement and technique  Ambulation/Gait Ambulation/Gait assistance: Supervision Gait Distance (Feet): 180 Feet Assistive device: Rolling walker (2 wheeled) Gait Pattern/deviations: Step-through pattern;Decreased step length - right;Decreased stride length;Decreased stance time - right;Antalgic;Decreased weight shift to right Gait velocity: decreased   General Gait Details: pt required cues for sequencing, no loss of balance, distance limited by pain   Stairs             Wheelchair Mobility    Modified Rankin (Stroke  Patients Only)       Balance Overall balance assessment: Needs assistance Sitting-balance support: Feet supported;No upper extremity supported Sitting balance-Leahy Scale: Good     Standing balance support: During functional activity;Bilateral upper extremity supported Standing balance-Leahy Scale: Fair                              Cognition Arousal/Alertness: Awake/alert Behavior During Therapy: WFL for tasks assessed/performed Overall Cognitive Status: Within Functional Limits for tasks assessed                                        Exercises Total Joint Exercises Ankle Circles/Pumps: AROM;Seated;Both;10 reps Quad Sets: AROM;5 reps;Seated Short Arc Quad: AROM;AAROM;Right;10 reps;Supine Heel Slides: AAROM;Right;20 reps;Supine Hip ABduction/ADduction: AROM;Right;10 reps;Supine Straight Leg Raises: AAROM;Right;10 reps;Supine Long Arc Quad: AROM;Right;10 reps;Seated Knee Flexion: AROM;AAROM;Right;10 reps;Seated Goniometric ROM: 0-50* AAROM R knee    General Comments        Pertinent Vitals/Pain Pain Score: 10-Worst pain ever Pain Location: R knee Pain Descriptors / Indicators: Sore;Cramping Pain Intervention(s): Limited activity within patient's tolerance;Monitored during session;Premedicated before session;RN gave pain meds during session;Patient requesting pain meds-RN notified;Ice applied    Home Living                      Prior Function            PT Goals (current goals can now be found in the care plan section) Acute Rehab PT Goals Patient Stated Goal: to return home  PT Goal Formulation: With patient Time For Goal Achievement: 02/09/19 Potential to Achieve Goals: Good Progress towards PT goals: Progressing toward goals    Frequency    7X/week      PT Plan Current plan remains appropriate    Co-evaluation              AM-PAC PT "6 Clicks" Mobility   Outcome Measure  Help needed turning from your  back to your side while in a flat bed without using bedrails?: A Little Help needed moving from lying on your back to sitting on the side of a flat bed without using bedrails?: A Little Help needed moving to and from a bed to a chair (including a wheelchair)?: A Little Help needed standing up from a chair using your arms (e.g., wheelchair or bedside chair)?: A Little Help needed to walk in hospital room?: A Little Help needed climbing 3-5 steps with a railing? : A Little 6 Click Score: 18    End of Session Equipment Utilized During Treatment: Gait belt Activity Tolerance: Patient tolerated treatment well Patient left: in chair;with call bell/phone within reach;with chair alarm set;with nursing/sitter in room Nurse Communication: Mobility status PT Visit Diagnosis: Other abnormalities of gait and mobility (R26.89);Muscle weakness (generalized) (M62.81);Unsteadiness on feet (R26.81);Difficulty in walking, not elsewhere classified (R26.2)     Time: [id number] PT Time Calculation (min) (ACUTE ONLY): 39 min  Charges:  $Gait Training: 8-22 mins $Therapeutic Exercise: 8-22 mins $Therapeutic Activity: 8-22 mins                    Blondell Reveal Kistler PT 02/03/2019  Acute Rehabilitation Services Pager [phone] Office [phone]

## 2019-02-03 NOTE — Progress Notes (Signed)
    Subjective:  Patient reports pain as mild to moderate.  Denies N/V/CP/SOB. No c/o. Wants to go home today.  Objective:   VITALS:   Vitals:   02/02/19 1930 02/02/19 2143 02/03/19 0113 02/03/19 0552  BP:  136/69 126/68 136/73  Pulse: 64 69 71 72  Resp: 16 16 14 14   Temp:  (!) 97.5 F (36.4 C) 98.4 F (36.9 C) 98.3 F (36.8 C)  TempSrc:  Oral Oral Oral  SpO2:  99% 96% 96%  Weight:      Height:        NAD ABD soft Sensation intact distally Intact pulses distally Dorsiflexion/Plantar flexion intact Incision: dressing C/D/I Compartment soft   Lab Results  Component Value Date   WBC 8.2 02/03/2019   HGB 9.7 (L) 02/03/2019   HCT 30.6 (L) 02/03/2019   MCV 95.6 02/03/2019   PLT 150 02/03/2019   BMET    Component Value Date/Time   NA 139 02/03/2019 0255   K 4.2 02/03/2019 0255   CL 108 02/03/2019 0255   CO2 23 02/03/2019 0255   GLUCOSE 92 02/03/2019 0255   BUN 15 02/03/2019 0255   CREATININE 0.79 02/03/2019 0255   CALCIUM 7.9 (L) 02/03/2019 0255   GFRNONAA >60 02/03/2019 0255   GFRAA >60 02/03/2019 0255     Assessment/Plan: 1 Day Post-Op   Principal Problem:   Osteoarthritis of right knee   WBAT with walker DVT ppx: Aspirin, SCDs, TEDS PO pain control PT/OT Dispo: D/C home with OPPT   Paula Hall Paula Hall 02/03/2019, 11:03 AM   Rod Can, MD Cell: [phone] [place] [facility]  [place] [address], [place], [place], [place] [address] Phone: [phone] [email or web address] Facebook  [employer]

## 2019-02-03 NOTE — Discharge Summary (Signed)
Physician Discharge Summary  Patient ID: Paula Hall MRN: [id number] DOB/AGE: 1961/01/12 58 y.o.  Admit date: 02/02/2019 Discharge date: 02/03/2019  Admission Diagnoses:  Osteoarthritis of right knee  Discharge Diagnoses:  Principal Problem:   Osteoarthritis of right knee   Past Medical History:  Diagnosis Date  . Abdominal hernia   . Anemia   . Arthritis   . Asthma   . Complication of anesthesia    woke up during procedure  . Depression   . Diabetes ([place])   . GERD (gastroesophageal reflux disease)   . Heart murmur   . Hypertension   . Hypothyroid   . Sleep apnea    does not wear CPAP  . Stroke [facility])    " mild stroke years ago"  . Vocal cord polyps    B/L    Surgeries: Procedure(s): COMPUTER ASSISTED TOTAL KNEE ARTHROPLASTY on 02/02/2019   Consultants (if any):   Discharged Condition: Improved  Hospital Course: Paula Hall is an 58 y.o. female who was admitted 02/02/2019 with a diagnosis of Osteoarthritis of right knee and went to the operating room on 02/02/2019 and underwent the above named procedures.    She was given perioperative antibiotics:  Anti-infectives (From admission, onward)   Start     Dose/Rate Route Frequency Ordered Stop   02/02/19 1500  ceFAZolin (ANCEF) IVPB 2g/100 mL premix     2 g 200 mL/hr over 30 Minutes Intravenous Every 6 hours 02/02/19 1353 02/02/19 2120   02/02/19 0600  ceFAZolin (ANCEF) IVPB 2g/100 mL premix     2 g 200 mL/hr over 30 Minutes Intravenous On call to O.R. 02/02/19 [id number] 02/02/19 0845    .  She was given sequential compression devices, early ambulation, and ASA for DVT prophylaxis.  She benefited maximally from the hospital stay and there were no complications.    Recent vital signs:  Vitals:   02/03/19 0552 02/03/19 1100  BP: 136/73 130/68  Pulse: 72 88  Resp: 14 15  Temp: 98.3 F (36.8 C) 98.3 F (36.8 C)  SpO2: 96% 97%    Recent laboratory studies:  Lab Results  Component Value Date   HGB 9.7 (L)  02/03/2019   HGB 12.2 01/31/2019   HGB 13.6 04/08/2017   Lab Results  Component Value Date   WBC 8.2 02/03/2019   PLT 150 02/03/2019   Lab Results  Component Value Date   INR 1.1 01/31/2019   Lab Results  Component Value Date   NA 139 02/03/2019   K 4.2 02/03/2019   CL 108 02/03/2019   CO2 23 02/03/2019   BUN 15 02/03/2019   CREATININE 0.79 02/03/2019   GLUCOSE 92 02/03/2019    Discharge Medications:   Allergies as of 02/03/2019      Reactions   Shellfish Allergy Shortness Of Breath, Nausea And Vomiting   Contrast Media [iodinated Diagnostic Agents] Nausea And Vomiting   Metrizamide Nausea And Vomiting      Medication List    STOP taking these medications   HYDROcodone-acetaminophen 10-325 MG tablet Commonly known as: NORCO     TAKE these medications   albuterol 108 (90 Base) MCG/ACT inhaler Commonly known as: VENTOLIN HFA Inhale 1-2 puffs into the lungs every 6 (six) hours as needed for wheezing or shortness of breath.   amLODipine 10 MG tablet Commonly known as: NORVASC Take 10 mg by mouth daily.   aspirin 81 MG chewable tablet Chew 1 tablet (81 mg total) by mouth 2 (two) times daily.  betamethasone dipropionate 0.05 % cream Apply 1 application topically daily as needed (rash).   CALCIUM + D PO Take 1 tablet by mouth 2 (two) times daily.   celecoxib 200 MG capsule Commonly known as: CELEBREX Take 1 capsule (200 mg total) by mouth daily.   clonazePAM 0.5 MG tablet Commonly known as: KLONOPIN Take 1 tablet (0.5 mg total) by mouth daily as needed for anxiety. What changed:   how much to take  when to take this   docusate sodium 100 MG capsule Commonly known as: COLACE Take 1 capsule (100 mg total) by mouth 2 (two) times daily.   ferrous sulfate 325 (65 FE) MG tablet Take 325 mg by mouth 2 (two) times daily.   Fish Oil 1000 MG Caps Take 1,000 mg by mouth daily.   fluticasone 50 MCG/ACT nasal spray Commonly known as: FLONASE Place 1 spray  into both nostrils daily as needed for allergies or rhinitis.   freestyle lancets 1 each by Other route as needed for other. Use as instructed   hydrOXYzine 25 MG tablet Commonly known as: ATARAX/VISTARIL Take 1-2 tab at bed time What changed:   how much to take  how to take this  when to take this  additional instructions   levothyroxine 137 MCG tablet Commonly known as: SYNTHROID Take 137 mcg by mouth daily before breakfast.   metFORMIN 500 MG tablet Commonly known as: GLUCOPHAGE Take 500 mg by mouth 2 (two) times daily with a meal.   multivitamin with minerals Tabs tablet Take 1 tablet by mouth daily.   omeprazole 40 MG capsule Commonly known as: PRILOSEC Take 40 mg by mouth daily.   ondansetron 4 MG tablet Commonly known as: ZOFRAN Take 1 tablet (4 mg total) by mouth every 6 (six) hours as needed for nausea.   Oxycodone HCl 10 MG Tabs Take 1 tablet (10 mg total) by mouth every 4 (four) hours as needed for up to 7 days.   QUEtiapine 50 MG tablet Commonly known as: SEROQUEL Take one to two tab at bed time. What changed:   how much to take  how to take this  when to take this  additional instructions   rosuvastatin 20 MG tablet Commonly known as: CRESTOR Take 20 mg by mouth every evening.   senna 8.6 MG Tabs tablet Commonly known as: SENOKOT Take 1 tablet (8.6 mg total) by mouth 2 (two) times daily.   vitamin C 500 MG tablet Commonly known as: ASCORBIC ACID Take 500 mg by mouth daily.   Vitamin D 50 MCG (2000 UT) tablet Take 2,000 Units by mouth daily.       Diagnostic Studies: Dg Knee Right Port  Result Date: 02/02/2019 CLINICAL DATA:  Status post knee replacement. EXAM: PORTABLE RIGHT KNEE - 1-2 VIEW COMPARISON:  None. FINDINGS: The femoral and tibial components appear to be well situated. Expected postoperative changes are noted anteriorly. IMPRESSION: Status post right total knee arthroplasty. Electronically Signed   By: Marijo Conception  M.D.   On: 02/02/2019 13:34    Disposition:    Discharge Instructions    Call MD / Call 911   Complete by: As directed    If you experience chest pain or shortness of breath, CALL 911 and be transported to the hospital emergency room.  If you develope a fever above 101 F, pus (white drainage) or increased drainage or redness at the wound, or calf pain, call your surgeon's office.   Constipation Prevention   Complete  by: As directed    Drink plenty of fluids.  Prune juice may be helpful.  You may use a stool softener, such as Colace (over the counter) 100 mg twice a day.  Use MiraLax (over the counter) for constipation as needed.   Diet - low sodium heart healthy   Complete by: As directed    Do not put a pillow under the knee. Place it under the heel.   Complete by: As directed    Driving restrictions   Complete by: As directed    No driving for 6 weeks   Increase activity slowly as tolerated   Complete by: As directed    Lifting restrictions   Complete by: As directed    No lifting for 6 weeks   TED hose   Complete by: As directed    Use stockings (TED hose) for 2 weeks on both leg(s).  You may remove them at night for sleeping.      Follow-up Information    Ernesta Trabert, Aaron Edelman, MD. Schedule an appointment as soon as possible for a visit in 2 weeks.   Specialty: Orthopedic Surgery Why: For wound re-check Contact information: [address] [place] [address] [id number]            Signed: Hilton Cork Conswella Bruney 02/03/2019, 8:20 PM

## 2019-02-04 ENCOUNTER — Telehealth: Payer: Self-pay | Admitting: General Practice

## 2019-02-08 ENCOUNTER — Other Ambulatory Visit: Payer: Self-pay

## 2019-02-08 ENCOUNTER — Ambulatory Visit (INDEPENDENT_AMBULATORY_CARE_PROVIDER_SITE_OTHER): Payer: Medicare Other | Admitting: Psychiatry

## 2019-02-08 ENCOUNTER — Encounter (HOSPITAL_COMMUNITY): Payer: Self-pay | Admitting: Psychiatry

## 2019-02-08 DIAGNOSIS — F411 Generalized anxiety disorder: Secondary | ICD-10-CM | POA: Diagnosis not present

## 2019-02-08 DIAGNOSIS — F319 Bipolar disorder, unspecified: Secondary | ICD-10-CM | POA: Diagnosis not present

## 2019-02-08 MED ORDER — HYDROXYZINE HCL 25 MG PO TABS: 25.0000 mg | ORAL_TABLET | Freq: Every day | ORAL | 1 refills | Status: AC

## 2019-02-08 MED ORDER — QUETIAPINE FUMARATE 50 MG PO TABS: ORAL_TABLET | ORAL | 1 refills | Status: DC

## 2019-02-08 MED ORDER — CLONAZEPAM 0.5 MG PO TABS: 0.5000 mg | ORAL_TABLET | Freq: Every day | ORAL | 1 refills | Status: DC | PRN

## 2019-02-08 NOTE — Progress Notes (Signed)
Virtual Visit via Telephone Note  I connected with Paula Hall on 02/08/19 at  3:40 PM EDT by telephone and verified that I am speaking with the correct person using two identifiers.   I discussed the limitations, risks, security and privacy concerns of performing an evaluation and management service by telephone and the availability of in person appointments. I also discussed with the patient that there may be a patient responsible charge related to this service. The patient expressed understanding and agreed to proceed.   History of Present Illness: Patient was evaluated on the phone.  She just had a arthroplasty and she is in a lot of pain.  She was admitted in the hospital and discharged on pain medication.  She is not happy because she still have a lot of pain.  She gets easily upset and irritable.  She admitted not able to pick up her Seroquel and continues to have mood up-and-down, anger, paranoia and irritability.  We have tried [place] but she did not like to [place] and felt that did not help her.  She is a poor historian.  In the beginning she felt the [place] working but on the last visit she said it is not working and she like to go back on Seroquel which she has taken in the past many years ago.  Patient told due to expense she could not afford Seroquel but later told that she never tried to go to the pharmacy to pick up Seroquel.  Now she promised that she will try Seroquel since pharmacy deliver the medicine home.  Currently her niece is helping her.  She appears at times irritable and describes anxiety nervousness.  She admitted sometimes takes more than prescribed Klonopin and hydroxyzine however she was told not to do that and she promised that she will not take more medicine than recommended.  She is sleeping on and off.  Patient has multiple health issues.  She denies any suicidal thoughts or homicidal thoughts.  She admitted sadness and highs and lows but denies any homicidal thoughts or  suicidal thoughts.  Her energy level is fair.  She denies any aggression violence or mania.   Past Psychiatric History:Reviewed. H/O psychiatric illness since early age. [facility] inpatient [facility]. No h/o suicidal attempt. H/O cutting her wrist. Last inpatient in 2015. History of paranoia, anxiety. Tried Ambien, Zoloft, Paxil, Thorazine, Depakote, Risperdal, Xanax and olanzapine but claims nothing worked.  Recently we tried [place] but stopped on her own.     Recent Results (from the past 2160 hour(s))  SARS CORONAVIRUS 2 (TAT 6-24 HRS) Nasopharyngeal Nasopharyngeal Swab     Status: None   Collection Time: 01/31/19 12:14 PM   Specimen: Nasopharyngeal Swab  Result Value Ref Range   SARS Coronavirus 2 NEGATIVE NEGATIVE    Comment: (NOTE) SARS-CoV-2 target nucleic acids are NOT DETECTED. The SARS-CoV-2 RNA is generally detectable in upper and lower respiratory specimens during the acute phase of infection. Negative results do not preclude SARS-CoV-2 infection, do not rule out co-infections with other pathogens, and should not be used as the sole basis for treatment or other patient management decisions. Negative results must be combined with clinical observations, patient history, and epidemiological information. The expected result is Negative. Fact Sheet for Patients: SugarRoll.be Fact Sheet for Healthcare Providers: https://www.woods-mathews.com/ This test is not yet approved or cleared by the [place] FDA and  has been authorized for detection and/or diagnosis of SARS-CoV-2 by FDA under an Emergency Use Authorization (EUA).  This EUA will remain  in effect (meaning this test can be used) for the duration of the COVID-19 declaration under Section 56 4(b)(1) of the Act, 21 U.S.C. section 360bbb-3(b)(1), unless the authorization is terminated or revoked sooner. Performed at [facility], [place] [address]  [place], [place] [address]   Glucose, capillary     Status: None   Collection Time: 01/31/19  2:51 PM  Result Value Ref Range   Glucose-Capillary 97 70 - 99 mg/dL  Type and screen Order type and screen if day of surgery is less than 15 days from draw of preadmission visit or order morning of surgery if day of surgery is greater than 6 days from preadmission visit.     Status: None   Collection Time: 01/31/19  3:18 PM  Result Value Ref Range   ABO/RH(D) A POS    Antibody Screen NEG    Sample Expiration 02/05/2019,2359    Extend sample reason      NO TRANSFUSIONS OR PREGNANCY IN THE PAST 3 MONTHS Performed at [facility], [place] [address] [place], [place] [address]   ABO/Rh     Status: None   Collection Time: 01/31/19  3:18 PM  Result Value Ref Range   ABO/RH(D)      A POS Performed at [facility], [place] [address] [place], [place] [address]   CBC     Status: None   Collection Time: 01/31/19  3:28 PM  Result Value Ref Range   WBC 5.8 4.0 - 10.5 K/uL   RBC 4.05 3.87 - 5.11 MIL/uL   Hemoglobin 12.2 12.0 - 15.0 g/dL   HCT 38.0 36.0 - 46.0 %   MCV 93.8 80.0 - 100.0 fL   MCH 30.1 26.0 - 34.0 pg   MCHC 32.1 30.0 - 36.0 g/dL   RDW 13.5 11.5 - 15.5 %   Platelets 204 150 - 400 K/uL   nRBC 0.0 0.0 - 0.2 %    Comment: Performed at [facility], [place] [address] [place], [place] [address]  Comprehensive metabolic panel     Status: Abnormal   Collection Time: 01/31/19  3:28 PM  Result Value Ref Range   Sodium 141 135 - 145 mmol/L   Potassium 3.9 3.5 - 5.1 mmol/L   Chloride 104 98 - 111 mmol/L   CO2 24 22 - 32 mmol/L   Glucose, Bld 104 (H) 70 - 99 mg/dL   BUN 13 6 - 20 mg/dL   Creatinine, Ser 0.84 0.44 - 1.00 mg/dL   Calcium 9.3 8.9 - 10.3 mg/dL   Total Protein 6.4 (L) 6.5 - 8.1 g/dL   Albumin 3.5 3.5 - 5.0 g/dL   AST 32 15 - 41 U/L   ALT 38 0 - 44 U/L   Alkaline Phosphatase 77 38 - 126 U/L   Total Bilirubin 0.4 0.3 - 1.2 mg/dL    GFR calc non Af Amer >60 >60 mL/min   GFR calc Af Amer >60 >60 mL/min   Anion gap 13 5 - 15    Comment: Performed at [facility], [place] [address] [place], [place] [address]  Protime-INR     Status: None   Collection Time: 01/31/19  3:28 PM  Result Value Ref Range   Prothrombin Time 14.4 11.4 - 15.2 seconds   INR 1.1 0.8 - 1.2    Comment: (NOTE) INR goal varies based on device and disease states. Performed at [facility],  [place] [address] [place], [place] [address]   Urinalysis, Routine w reflex microscopic     Status: Abnormal   Collection Time: 01/31/19  3:28 PM  Result Value Ref Range   Color, Urine YELLOW YELLOW   APPearance HAZY (A) CLEAR   Specific Gravity, Urine 1.017 1.005 - 1.030   pH 6.0 5.0 - 8.0   Glucose, UA NEGATIVE NEGATIVE mg/dL   Hgb urine dipstick NEGATIVE NEGATIVE   Bilirubin Urine NEGATIVE NEGATIVE   Ketones, ur NEGATIVE NEGATIVE mg/dL   Protein, ur NEGATIVE NEGATIVE mg/dL   Nitrite NEGATIVE NEGATIVE   Leukocytes,Ua NEGATIVE NEGATIVE    Comment: Performed at [place] [address] [place], [place] [address]  Hemoglobin A1c     Status: None   Collection Time: 01/31/19  3:28 PM  Result Value Ref Range   Hgb A1c MFr Bld 5.4 4.8 - 5.6 %    Comment: (NOTE) Pre diabetes:          5.7%-6.4% Diabetes:              >6.4% Glycemic control for   <7.0% adults with diabetes    Mean Plasma Glucose 108.28 mg/dL    Comment: Performed at [place] [address] [place], [place] [address]  Surgical pcr screen     Status: None   Collection Time: 01/31/19  3:28 PM   Specimen: Nasal Swab  Result Value Ref Range   MRSA, PCR NEGATIVE NEGATIVE   Staphylococcus aureus NEGATIVE NEGATIVE    Comment: (NOTE) The Xpert SA Assay (FDA approved for NASAL specimens in patients 46 years of age and older), is one component of a comprehensive surveillance program. It is not intended to diagnose infection  nor to guide or monitor treatment. Performed at [facility], [place] [address] [place], [place] [address]   Glucose, capillary     Status: Abnormal   Collection Time: 02/02/19  5:58 AM  Result Value Ref Range   Glucose-Capillary 106 (H) 70 - 99 mg/dL  Glucose, capillary     Status: Abnormal   Collection Time: 02/02/19 12:19 PM  Result Value Ref Range   Glucose-Capillary 113 (H) 70 - 99 mg/dL   Comment 1 Notify RN    Comment 2 Document in Chart   Glucose, capillary     Status: Abnormal   Collection Time: 02/02/19  5:03 PM  Result Value Ref Range   Glucose-Capillary 327 (H) 70 - 99 mg/dL  Glucose, capillary     Status: Abnormal   Collection Time: 02/02/19  9:46 PM  Result Value Ref Range   Glucose-Capillary 344 (H) 70 - 99 mg/dL  CBC     Status: Abnormal   Collection Time: 02/03/19  2:55 AM  Result Value Ref Range   WBC 8.2 4.0 - 10.5 K/uL   RBC 3.20 (L) 3.87 - 5.11 MIL/uL   Hemoglobin 9.7 (L) 12.0 - 15.0 g/dL   HCT 30.6 (L) 36.0 - 46.0 %   MCV 95.6 80.0 - 100.0 fL   MCH 30.3 26.0 - 34.0 pg   MCHC 31.7 30.0 - 36.0 g/dL   RDW 13.6 11.5 - 15.5 %   Platelets 150 150 - 400 K/uL   nRBC 0.0 0.0 - 0.2 %    Comment: Performed at [facility], [place] [address] [place], [place] [id number]  Basic metabolic panel     Status: Abnormal   Collection Time: 02/03/19  2:55 AM  Result Value Ref  Range   Sodium 139 135 - 145 mmol/L   Potassium 4.2 3.5 - 5.1 mmol/L   Chloride 108 98 - 111 mmol/L   CO2 23 22 - 32 mmol/L   Glucose, Bld 92 70 - 99 mg/dL   BUN 15 6 - 20 mg/dL   Creatinine, Ser 0.79 0.44 - 1.00 mg/dL   Calcium 7.9 (L) 8.9 - 10.3 mg/dL   GFR calc non Af Amer >60 >60 mL/min   GFR calc Af Amer >60 >60 mL/min   Anion gap 8 5 - 15    Comment: Performed at [facility], [place] [address] [place], [place] [address]  Glucose, capillary     Status: Abnormal   Collection Time: 02/03/19  7:47 AM  Result Value Ref Range    Glucose-Capillary 115 (H) 70 - 99 mg/dL  Glucose, capillary     Status: Abnormal   Collection Time: 02/03/19 11:10 AM  Result Value Ref Range   Glucose-Capillary 141 (H) 70 - 99 mg/dL     Psychiatric Specialty Exam: Physical Exam  Review of Systems  Musculoskeletal: Positive for joint pain.  Psychiatric/Behavioral: The patient is nervous/anxious and has insomnia.     Last menstrual period 08/17/2010.There is no height or weight on file to calculate BMI.  General Appearance: Negative  Eye Contact:  NA  Speech:  Fast and pressured at times.  Volume:  Increased  Mood:  Irritable  Affect:  NA  Thought Process:  Descriptions of Associations: Circumstantial  Orientation:  Full (Time, Place, and Person)  Thought Content:  Paranoid Ideation and Rumination  Suicidal Thoughts:  No  Homicidal Thoughts:  No  Memory:  Immediate;   Fair Recent;   Fair Remote;   Fair  Judgement:  Fair  Insight:  Fair  Psychomotor Activity:  Increased  Concentration:  Concentration: Fair and Attention Span: Fair  Recall:  [employer] of Knowledge:  Fair  Language:  Fair  Akathisia:  No  Handed:  Right  AIMS (if indicated):     Assets:  Desire for Improvement Housing Social Support  ADL's:  Intact  Cognition:  WNL  Sleep:   poor      Assessment and Plan: Bipolar disorder type I.  Generalized anxiety disorder.  Discussed not to take the medication other than recommended dose.  Recommended to continue Klonopin 0.5 mg as needed for anxiety.  Recommended to continue hydroxyzine 25 mg at bedtime.  She promised that she will start Seroquel 50 mg at bedtime and if she has any question of any concern that she will call us.  Discuss safety concern that anytime having active suicidal thoughts or homicidal thought that she need to call 911 of the local emergency room.  Time spent 30 minutes.  More than 50% of the time spent in psychoeducation, counseling, coronation of care and discussing long-term prognosis.   I reviewed blood work and collect information from other providers.  Follow-up in 2 months.  Follow Up Instructions:    I discussed the assessment and treatment plan with the patient. The patient was provided an opportunity to ask questions and all were answered. The patient agreed with the plan and demonstrated an understanding of the instructions.   The patient was advised to call back or seek an in-person evaluation if the symptoms worsen or if the condition fails to improve as anticipated.  I provided 30 minutes of non-face-to-face time during this encounter.   Kathlee Nations, MD

## 2019-02-10 ENCOUNTER — Encounter (HOSPITAL_COMMUNITY): Payer: Self-pay | Admitting: Orthopedic Surgery

## 2019-02-15 DIAGNOSIS — Z96651 Presence of right artificial knee joint: Secondary | ICD-10-CM | POA: Diagnosis not present

## 2019-02-15 DIAGNOSIS — Z471 Aftercare following joint replacement surgery: Secondary | ICD-10-CM | POA: Diagnosis not present

## 2019-02-16 DIAGNOSIS — M25661 Stiffness of right knee, not elsewhere classified: Secondary | ICD-10-CM | POA: Insufficient documentation

## 2019-02-16 DIAGNOSIS — M25561 Pain in right knee: Secondary | ICD-10-CM | POA: Diagnosis not present

## 2019-02-22 DIAGNOSIS — M25561 Pain in right knee: Secondary | ICD-10-CM | POA: Diagnosis not present

## 2019-02-22 DIAGNOSIS — M25661 Stiffness of right knee, not elsewhere classified: Secondary | ICD-10-CM | POA: Diagnosis not present

## 2019-02-23 DIAGNOSIS — G8929 Other chronic pain: Secondary | ICD-10-CM | POA: Diagnosis not present

## 2019-02-23 DIAGNOSIS — M1710 Unilateral primary osteoarthritis, unspecified knee: Secondary | ICD-10-CM | POA: Diagnosis not present

## 2019-02-23 DIAGNOSIS — Z79899 Other long term (current) drug therapy: Secondary | ICD-10-CM | POA: Diagnosis not present

## 2019-02-23 DIAGNOSIS — E1165 Type 2 diabetes mellitus with hyperglycemia: Secondary | ICD-10-CM | POA: Diagnosis not present

## 2019-02-23 DIAGNOSIS — M545 Low back pain: Secondary | ICD-10-CM | POA: Diagnosis not present

## 2019-02-25 DIAGNOSIS — M25561 Pain in right knee: Secondary | ICD-10-CM | POA: Diagnosis not present

## 2019-02-25 DIAGNOSIS — M25661 Stiffness of right knee, not elsewhere classified: Secondary | ICD-10-CM | POA: Diagnosis not present

## 2019-02-25 DIAGNOSIS — M1711 Unilateral primary osteoarthritis, right knee: Secondary | ICD-10-CM | POA: Diagnosis not present

## 2019-02-26 ENCOUNTER — Other Ambulatory Visit (HOSPITAL_COMMUNITY)
Admission: RE | Admit: 2019-02-26 | Discharge: 2019-02-26 | Disposition: A | Discharge status: Home / Self Care | Payer: Medicare Other | Source: Ambulatory Visit | Attending: Orthopedic Surgery | Admitting: Orthopedic Surgery

## 2019-02-26 DIAGNOSIS — Z01812 Encounter for preprocedural laboratory examination: Secondary | ICD-10-CM | POA: Insufficient documentation

## 2019-02-26 DIAGNOSIS — Z20828 Contact with and (suspected) exposure to other viral communicable diseases: Secondary | ICD-10-CM | POA: Diagnosis not present

## 2019-02-27 LAB — NOVEL CORONAVIRUS, NAA (HOSP ORDER, SEND-OUT TO REF LAB; TAT 18-24 HRS): SARS-CoV-2, NAA: NOT DETECTED

## 2019-02-28 ENCOUNTER — Ambulatory Visit: Payer: Self-pay | Admitting: Orthopedic Surgery

## 2019-03-01 ENCOUNTER — Other Ambulatory Visit: Payer: Self-pay

## 2019-03-01 ENCOUNTER — Encounter (HOSPITAL_COMMUNITY): Payer: Self-pay | Admitting: *Deleted

## 2019-03-01 DIAGNOSIS — Z8673 Personal history of transient ischemic attack (TIA), and cerebral infarction without residual deficits: Secondary | ICD-10-CM | POA: Diagnosis not present

## 2019-03-01 DIAGNOSIS — Z96651 Presence of right artificial knee joint: Secondary | ICD-10-CM | POA: Diagnosis not present

## 2019-03-01 DIAGNOSIS — Z9884 Bariatric surgery status: Secondary | ICD-10-CM | POA: Diagnosis not present

## 2019-03-01 DIAGNOSIS — I10 Essential (primary) hypertension: Secondary | ICD-10-CM | POA: Diagnosis not present

## 2019-03-01 DIAGNOSIS — Z79899 Other long term (current) drug therapy: Secondary | ICD-10-CM | POA: Diagnosis not present

## 2019-03-01 DIAGNOSIS — Z79890 Hormone replacement therapy (postmenopausal): Secondary | ICD-10-CM | POA: Diagnosis not present

## 2019-03-01 DIAGNOSIS — F17210 Nicotine dependence, cigarettes, uncomplicated: Secondary | ICD-10-CM | POA: Diagnosis not present

## 2019-03-01 DIAGNOSIS — E039 Hypothyroidism, unspecified: Secondary | ICD-10-CM | POA: Diagnosis not present

## 2019-03-01 DIAGNOSIS — T8131XA Disruption of external operation (surgical) wound, not elsewhere classified, initial encounter: Secondary | ICD-10-CM | POA: Diagnosis not present

## 2019-03-01 DIAGNOSIS — J45909 Unspecified asthma, uncomplicated: Secondary | ICD-10-CM | POA: Diagnosis not present

## 2019-03-01 DIAGNOSIS — D649 Anemia, unspecified: Secondary | ICD-10-CM | POA: Diagnosis not present

## 2019-03-01 DIAGNOSIS — Z7984 Long term (current) use of oral hypoglycemic drugs: Secondary | ICD-10-CM | POA: Diagnosis not present

## 2019-03-01 DIAGNOSIS — F329 Major depressive disorder, single episode, unspecified: Secondary | ICD-10-CM | POA: Diagnosis not present

## 2019-03-01 DIAGNOSIS — Z791 Long term (current) use of non-steroidal anti-inflammatories (NSAID): Secondary | ICD-10-CM | POA: Diagnosis not present

## 2019-03-01 DIAGNOSIS — G4730 Sleep apnea, unspecified: Secondary | ICD-10-CM | POA: Diagnosis not present

## 2019-03-01 DIAGNOSIS — K219 Gastro-esophageal reflux disease without esophagitis: Secondary | ICD-10-CM | POA: Diagnosis not present

## 2019-03-01 DIAGNOSIS — E119 Type 2 diabetes mellitus without complications: Secondary | ICD-10-CM | POA: Diagnosis not present

## 2019-03-01 DIAGNOSIS — Y831 Surgical operation with implant of artificial internal device as the cause of abnormal reaction of the patient, or of later complication, without mention of misadventure at the time of the procedure: Secondary | ICD-10-CM | POA: Diagnosis not present

## 2019-03-01 NOTE — Progress Notes (Addendum)
NO SOLID FOOD AFTER MIDNIGHT THE NIGHT PRIOR TO SURGERY.  NOTHING BY MOUTH EXCEPT CLEAR LIQUIDS UNTIL 1130 am  .  PLEASE FINISH Gatorade  G2  DRINK PER SURGEON ORDER  WHICH NEEDS TO BE COMPLETED AT  1130 am .   Do not use any milk products or creamer (Liquid or powder)  in coffee!   CLEAR LIQUID DIET   Foods Allowed                                                                     Foods Excluded  Coffee and tea, regular and decaf                             liquids that you cannot  Plain Jell-O any favor except red or purple                                           see through such as: Fruit ices (not with fruit pulp)                                     milk, soups, orange juice  Iced Popsicles                                    All solid food Carbonated beverages, regular and diet                                    Cranberry, grape and apple juices Sports drinks like Gatorade Lightly seasoned clear broth or consume(fat free) Sugar, honey syrup  Sample Menu Breakfast                                Lunch                                     Supper Cranberry juice                    Beef broth                            Chicken broth Jell-O                                     Grape juice                           Apple juice Coffee or tea                        Jell-O  Popsicle                                                Coffee or tea                        Coffee or tea  _____________________________________________________________________  Reviewed instructions with patient via phone with patient verbalizing understanding.

## 2019-03-01 NOTE — Progress Notes (Signed)
PCP -Dr. Glendon Axe  Cardiologist - None  Chest x-ray -none 05/28/2018- CT chest from [facility]   EKG - 01/31/2019 Stress Test - none ECHO - none Cardiac Cath - none  Sleep Study - many years ago-DX Sleep Apnea  CPAP - patient does not use CPAP  Fasting Blood Sugar - 100-130 Checks Blood Sugar __3___ times a day  Blood Thinner Instructions:none Aspirin Instructions:none Last Dose:none  Anesthesia review:  Chart given to Willeen Cass, PA to review.  Patient has a history of Asthma, HTN, Diabetes, heart murmur, Sleep apnea, and stroke.  Patient denies shortness of breath, fever, cough and chest pain at PAT appointment   Patient verbalized understanding of instructions that were given to them at the PAT appointment. Patient was also instructed that they will need to review over the PAT instructions again at home before surgery.

## 2019-03-02 ENCOUNTER — Encounter (HOSPITAL_COMMUNITY)
Admission: RE | Disposition: A | Discharge status: Home / Self Care | Payer: Self-pay | Source: Home / Self Care | Attending: Orthopedic Surgery

## 2019-03-02 ENCOUNTER — Encounter (HOSPITAL_COMMUNITY): Payer: Self-pay | Admitting: General Practice

## 2019-03-02 ENCOUNTER — Ambulatory Visit (HOSPITAL_COMMUNITY): Payer: Medicare Other | Admitting: Certified Registered Nurse Anesthetist

## 2019-03-02 ENCOUNTER — Observation Stay (HOSPITAL_COMMUNITY)
Admission: RE | Admit: 2019-03-02 | Discharge: 2019-03-03 | Disposition: A | Discharge status: Home / Self Care | Payer: Medicare Other | Attending: Orthopedic Surgery | Admitting: Orthopedic Surgery

## 2019-03-02 DIAGNOSIS — F329 Major depressive disorder, single episode, unspecified: Secondary | ICD-10-CM | POA: Insufficient documentation

## 2019-03-02 DIAGNOSIS — T8131XA Disruption of external operation (surgical) wound, not elsewhere classified, initial encounter: Principal | ICD-10-CM | POA: Diagnosis present

## 2019-03-02 DIAGNOSIS — Z9884 Bariatric surgery status: Secondary | ICD-10-CM | POA: Diagnosis not present

## 2019-03-02 DIAGNOSIS — G4730 Sleep apnea, unspecified: Secondary | ICD-10-CM | POA: Diagnosis not present

## 2019-03-02 DIAGNOSIS — K219 Gastro-esophageal reflux disease without esophagitis: Secondary | ICD-10-CM | POA: Insufficient documentation

## 2019-03-02 DIAGNOSIS — L089 Local infection of the skin and subcutaneous tissue, unspecified: Secondary | ICD-10-CM | POA: Diagnosis present

## 2019-03-02 DIAGNOSIS — J45909 Unspecified asthma, uncomplicated: Secondary | ICD-10-CM | POA: Insufficient documentation

## 2019-03-02 DIAGNOSIS — E119 Type 2 diabetes mellitus without complications: Secondary | ICD-10-CM | POA: Diagnosis not present

## 2019-03-02 DIAGNOSIS — Z79890 Hormone replacement therapy (postmenopausal): Secondary | ICD-10-CM | POA: Insufficient documentation

## 2019-03-02 DIAGNOSIS — Z7984 Long term (current) use of oral hypoglycemic drugs: Secondary | ICD-10-CM | POA: Insufficient documentation

## 2019-03-02 DIAGNOSIS — I10 Essential (primary) hypertension: Secondary | ICD-10-CM | POA: Insufficient documentation

## 2019-03-02 DIAGNOSIS — F17210 Nicotine dependence, cigarettes, uncomplicated: Secondary | ICD-10-CM | POA: Insufficient documentation

## 2019-03-02 DIAGNOSIS — Z791 Long term (current) use of non-steroidal anti-inflammatories (NSAID): Secondary | ICD-10-CM | POA: Diagnosis not present

## 2019-03-02 DIAGNOSIS — E039 Hypothyroidism, unspecified: Secondary | ICD-10-CM | POA: Insufficient documentation

## 2019-03-02 DIAGNOSIS — E041 Nontoxic single thyroid nodule: Secondary | ICD-10-CM | POA: Diagnosis not present

## 2019-03-02 DIAGNOSIS — D649 Anemia, unspecified: Secondary | ICD-10-CM | POA: Insufficient documentation

## 2019-03-02 DIAGNOSIS — Z96651 Presence of right artificial knee joint: Secondary | ICD-10-CM | POA: Diagnosis not present

## 2019-03-02 DIAGNOSIS — T148XXA Other injury of unspecified body region, initial encounter: Secondary | ICD-10-CM | POA: Diagnosis present

## 2019-03-02 DIAGNOSIS — Z79899 Other long term (current) drug therapy: Secondary | ICD-10-CM | POA: Diagnosis not present

## 2019-03-02 DIAGNOSIS — Z8673 Personal history of transient ischemic attack (TIA), and cerebral infarction without residual deficits: Secondary | ICD-10-CM | POA: Insufficient documentation

## 2019-03-02 DIAGNOSIS — Y831 Surgical operation with implant of artificial internal device as the cause of abnormal reaction of the patient, or of later complication, without mention of misadventure at the time of the procedure: Secondary | ICD-10-CM | POA: Insufficient documentation

## 2019-03-02 DIAGNOSIS — T8132XA Disruption of internal operation (surgical) wound, not elsewhere classified, initial encounter: Secondary | ICD-10-CM | POA: Diagnosis not present

## 2019-03-02 HISTORY — PX: IRRIGATION AND DEBRIDEMENT KNEE: SHX5185

## 2019-03-02 LAB — URINALYSIS, ROUTINE W REFLEX MICROSCOPIC
Bilirubin Urine: NEGATIVE
Glucose, UA: NEGATIVE mg/dL
Hgb urine dipstick: NEGATIVE
Ketones, ur: NEGATIVE mg/dL
Leukocytes,Ua: NEGATIVE
Nitrite: NEGATIVE
Protein, ur: NEGATIVE mg/dL
Specific Gravity, Urine: 1.015 (ref 1.005–1.030)
pH: 5 (ref 5.0–8.0)

## 2019-03-02 LAB — TYPE AND SCREEN
ABO/RH(D): A POS
Antibody Screen: NEGATIVE

## 2019-03-02 LAB — CBC
HCT: 34.9 % — ABNORMAL LOW (ref 36.0–46.0)
Hemoglobin: 11.2 g/dL — ABNORMAL LOW (ref 12.0–15.0)
MCH: 29 pg (ref 26.0–34.0)
MCHC: 32.1 g/dL (ref 30.0–36.0)
MCV: 90.4 fL (ref 80.0–100.0)
Platelets: 317 10*3/uL (ref 150–400)
RBC: 3.86 MIL/uL — ABNORMAL LOW (ref 3.87–5.11)
RDW: 15.6 % — ABNORMAL HIGH (ref 11.5–15.5)
WBC: 5.5 10*3/uL (ref 4.0–10.5)
nRBC: 0 % (ref 0.0–0.2)

## 2019-03-02 LAB — COMPREHENSIVE METABOLIC PANEL
ALT: 21 U/L (ref 0–44)
AST: 37 U/L (ref 15–41)
Albumin: 3.2 g/dL — ABNORMAL LOW (ref 3.5–5.0)
Alkaline Phosphatase: 109 U/L (ref 38–126)
Anion gap: 9 (ref 5–15)
BUN: 12 mg/dL (ref 6–20)
CO2: 25 mmol/L (ref 22–32)
Calcium: 9 mg/dL (ref 8.9–10.3)
Chloride: 103 mmol/L (ref 98–111)
Creatinine, Ser: 0.81 mg/dL (ref 0.44–1.00)
GFR calc Af Amer: >60 mL/min (ref >60)
GFR calc non Af Amer: >60 mL/min (ref >60)
Glucose, Bld: 111 mg/dL — ABNORMAL HIGH (ref 70–99)
Potassium: 4.5 mmol/L (ref 3.5–5.1)
Sodium: 137 mmol/L (ref 135–145)
Total Bilirubin: 0.4 mg/dL (ref 0.3–1.2)
Total Protein: 7 g/dL (ref 6.5–8.1)

## 2019-03-02 LAB — GLUCOSE, CAPILLARY
Glucose-Capillary: 136 mg/dL — ABNORMAL HIGH (ref 70–99)
Glucose-Capillary: 105 mg/dL — ABNORMAL HIGH (ref 70–99)
Glucose-Capillary: 190 mg/dL — ABNORMAL HIGH (ref 70–99)

## 2019-03-02 LAB — SURGICAL PCR SCREEN
MRSA, PCR: NEGATIVE
Staphylococcus aureus: NEGATIVE

## 2019-03-02 LAB — PROTIME-INR
INR: 1 (ref 0.8–1.2)
Prothrombin Time: 13.1 seconds (ref 11.4–15.2)

## 2019-03-02 SURGERY — IRRIGATION AND DEBRIDEMENT KNEE
Anesthesia: General | Site: Knee | Laterality: Right

## 2019-03-02 MED ORDER — MORPHINE SULFATE (PF) 2 MG/ML IV SOLN
0.5000 mg | INTRAVENOUS | Status: DC | PRN
  Administered 2019-03-02 (×2): 1 mg via INTRAVENOUS
  Administered 2019-03-03: 0.5 mg via INTRAVENOUS
  Filled 2019-03-02 (×3): qty 1

## 2019-03-02 MED ORDER — FENTANYL CITRATE (PF) 100 MCG/2ML IJ SOLN
INTRAMUSCULAR | Status: DC | PRN
  Administered 2019-03-02: 50 ug via INTRAVENOUS
  Administered 2019-03-02 (×6): 25 ug via INTRAVENOUS

## 2019-03-02 MED ORDER — METOCLOPRAMIDE HCL 5 MG PO TABS: 5.0000 mg | ORAL_TABLET | Freq: Three times a day (TID) | ORAL | Status: DC | PRN

## 2019-03-02 MED ORDER — CEFAZOLIN SODIUM-DEXTROSE 2-4 GM/100ML-% IV SOLN
2.0000 g | INTRAVENOUS | Status: AC
  Administered 2019-03-02: 2 g via INTRAVENOUS
  Filled 2019-03-02: qty 100

## 2019-03-02 MED ORDER — MIDAZOLAM HCL 5 MG/5ML IJ SOLN
INTRAMUSCULAR | Status: DC | PRN
  Administered 2019-03-02: 2 mg via INTRAVENOUS

## 2019-03-02 MED ORDER — DEXAMETHASONE SODIUM PHOSPHATE 10 MG/ML IJ SOLN
INTRAMUSCULAR | Status: DC | PRN
  Administered 2019-03-02: 5 mg via INTRAVENOUS

## 2019-03-02 MED ORDER — PROPOFOL 10 MG/ML IV BOLUS
INTRAVENOUS | Status: DC | PRN
  Administered 2019-03-02: 150 mg via INTRAVENOUS

## 2019-03-02 MED ORDER — FENTANYL CITRATE (PF) 100 MCG/2ML IJ SOLN
INTRAMUSCULAR | Status: AC
  Filled 2019-03-02: qty 2

## 2019-03-02 MED ORDER — ONDANSETRON HCL 4 MG/2ML IJ SOLN: 4.0000 mg | Freq: Four times a day (QID) | INTRAMUSCULAR | Status: DC | PRN

## 2019-03-02 MED ORDER — DEXAMETHASONE SODIUM PHOSPHATE 10 MG/ML IJ SOLN
INTRAMUSCULAR | Status: AC
  Filled 2019-03-02: qty 1

## 2019-03-02 MED ORDER — ALBUTEROL SULFATE (2.5 MG/3ML) 0.083% IN NEBU: 3.0000 mL | INHALATION_SOLUTION | Freq: Four times a day (QID) | RESPIRATORY_TRACT | Status: DC | PRN

## 2019-03-02 MED ORDER — ENOXAPARIN SODIUM 40 MG/0.4ML ~~LOC~~ SOLN
40.0000 mg | SUBCUTANEOUS | Status: DC
  Administered 2019-03-03: 40 mg via SUBCUTANEOUS
  Filled 2019-03-02: qty 0.4

## 2019-03-02 MED ORDER — AMLODIPINE BESYLATE 10 MG PO TABS
10.0000 mg | ORAL_TABLET | Freq: Every day | ORAL | Status: DC
  Administered 2019-03-03: 10:00:00 10 mg via ORAL
  Filled 2019-03-02: qty 1

## 2019-03-02 MED ORDER — INSULIN ASPART 100 UNIT/ML ~~LOC~~ SOLN: 0.0000 [IU] | Freq: Three times a day (TID) | SUBCUTANEOUS | Status: DC

## 2019-03-02 MED ORDER — ACETAMINOPHEN 500 MG PO TABS
1000.0000 mg | ORAL_TABLET | Freq: Once | ORAL | Status: AC
  Administered 2019-03-02: 1000 mg via ORAL
  Filled 2019-03-02: qty 2

## 2019-03-02 MED ORDER — METOCLOPRAMIDE HCL 5 MG/ML IJ SOLN: 5.0000 mg | Freq: Three times a day (TID) | INTRAMUSCULAR | Status: DC | PRN

## 2019-03-02 MED ORDER — HYDROCODONE-ACETAMINOPHEN 7.5-325 MG PO TABS
1.0000 | ORAL_TABLET | ORAL | Status: DC | PRN
  Administered 2019-03-02: 2 via ORAL
  Administered 2019-03-03: 1 via ORAL
  Administered 2019-03-03 (×2): 2 via ORAL
  Filled 2019-03-02: qty 1
  Filled 2019-03-02 (×3): qty 2

## 2019-03-02 MED ORDER — POVIDONE-IODINE 10 % EX SWAB: 2.0000 "application " | Freq: Once | CUTANEOUS | Status: DC

## 2019-03-02 MED ORDER — FENTANYL CITRATE (PF) 100 MCG/2ML IJ SOLN
INTRAMUSCULAR | Status: AC
  Administered 2019-03-02: 17:00:00 50 ug via INTRAVENOUS
  Filled 2019-03-02: qty 2

## 2019-03-02 MED ORDER — ROSUVASTATIN CALCIUM 20 MG PO TABS: 20.0000 mg | ORAL_TABLET | Freq: Every evening | ORAL | Status: DC

## 2019-03-02 MED ORDER — METFORMIN HCL 500 MG PO TABS
500.0000 mg | ORAL_TABLET | Freq: Two times a day (BID) | ORAL | Status: DC
  Administered 2019-03-03: 09:00:00 500 mg via ORAL
  Filled 2019-03-02: qty 1

## 2019-03-02 MED ORDER — QUETIAPINE FUMARATE 50 MG PO TABS
50.0000 mg | ORAL_TABLET | Freq: Every day | ORAL | Status: DC
  Administered 2019-03-02: 50 mg via ORAL
  Filled 2019-03-02: qty 1

## 2019-03-02 MED ORDER — CLONAZEPAM 0.5 MG PO TABS: 0.5000 mg | ORAL_TABLET | Freq: Every day | ORAL | Status: DC | PRN

## 2019-03-02 MED ORDER — OXYCODONE HCL 5 MG PO TABS: 5.0000 mg | ORAL_TABLET | Freq: Once | ORAL | Status: DC | PRN

## 2019-03-02 MED ORDER — OXYCODONE HCL 5 MG/5ML PO SOLN: 5.0000 mg | Freq: Once | ORAL | Status: DC | PRN

## 2019-03-02 MED ORDER — FENTANYL CITRATE (PF) 100 MCG/2ML IJ SOLN
25.0000 ug | INTRAMUSCULAR | Status: DC | PRN
  Administered 2019-03-02 (×3): 50 ug via INTRAVENOUS

## 2019-03-02 MED ORDER — VANCOMYCIN HCL 10 G IV SOLR: 1250.0000 mg | INTRAVENOUS | Status: DC

## 2019-03-02 MED ORDER — VITAMIN C 500 MG PO TABS
500.0000 mg | ORAL_TABLET | Freq: Every day | ORAL | Status: DC
  Administered 2019-03-03: 10:00:00 500 mg via ORAL
  Filled 2019-03-02: qty 1

## 2019-03-02 MED ORDER — TRANEXAMIC ACID-NACL 1000-0.7 MG/100ML-% IV SOLN
1000.0000 mg | INTRAVENOUS | Status: AC
  Administered 2019-03-02: 1000 mg via INTRAVENOUS
  Filled 2019-03-02: qty 100

## 2019-03-02 MED ORDER — ONDANSETRON HCL 4 MG/2ML IJ SOLN
INTRAMUSCULAR | Status: AC
  Filled 2019-03-02: qty 2

## 2019-03-02 MED ORDER — HYDROXYZINE HCL 25 MG PO TABS
25.0000 mg | ORAL_TABLET | Freq: Every day | ORAL | Status: DC
  Administered 2019-03-02: 22:00:00 50 mg via ORAL
  Filled 2019-03-02: qty 2

## 2019-03-02 MED ORDER — HYDROMORPHONE HCL 1 MG/ML IJ SOLN
0.5000 mg | INTRAMUSCULAR | Status: DC | PRN
  Administered 2019-03-02 (×2): 0.5 mg via INTRAVENOUS

## 2019-03-02 MED ORDER — FENTANYL CITRATE (PF) 100 MCG/2ML IJ SOLN
INTRAMUSCULAR | Status: AC
  Administered 2019-03-02: 50 ug via INTRAVENOUS
  Filled 2019-03-02: qty 2

## 2019-03-02 MED ORDER — HYDROCODONE-ACETAMINOPHEN 5-325 MG PO TABS: 1.0000 | ORAL_TABLET | ORAL | Status: DC | PRN

## 2019-03-02 MED ORDER — LACTATED RINGERS IV SOLN
INTRAVENOUS | Status: DC
  Administered 2019-03-02: 13:00:00 via INTRAVENOUS

## 2019-03-02 MED ORDER — SODIUM CHLORIDE 0.9 % IV SOLN
INTRAVENOUS | Status: DC
  Administered 2019-03-02: 18:00:00 via INTRAVENOUS

## 2019-03-02 MED ORDER — LEVOTHYROXINE SODIUM 25 MCG PO TABS
137.0000 ug | ORAL_TABLET | Freq: Every day | ORAL | Status: DC
  Administered 2019-03-03: 137 ug via ORAL
  Filled 2019-03-02: qty 1

## 2019-03-02 MED ORDER — CHLORHEXIDINE GLUCONATE 4 % EX LIQD: 60.0000 mL | Freq: Once | CUTANEOUS | Status: DC

## 2019-03-02 MED ORDER — LIDOCAINE 2% (20 MG/ML) 5 ML SYRINGE
INTRAMUSCULAR | Status: DC | PRN
  Administered 2019-03-02: 60 mg via INTRAVENOUS

## 2019-03-02 MED ORDER — ACETAMINOPHEN 325 MG PO TABS: 325.0000 mg | ORAL_TABLET | Freq: Four times a day (QID) | ORAL | Status: DC | PRN

## 2019-03-02 MED ORDER — ONDANSETRON HCL 4 MG PO TABS: 4.0000 mg | ORAL_TABLET | Freq: Four times a day (QID) | ORAL | Status: DC | PRN

## 2019-03-02 MED ORDER — MIDAZOLAM HCL 2 MG/2ML IJ SOLN
INTRAMUSCULAR | Status: AC
  Filled 2019-03-02: qty 2

## 2019-03-02 MED ORDER — DOCUSATE SODIUM 100 MG PO CAPS
100.0000 mg | ORAL_CAPSULE | Freq: Two times a day (BID) | ORAL | Status: DC
  Administered 2019-03-02 – 2019-03-03 (×2): 100 mg via ORAL
  Filled 2019-03-02 (×2): qty 1

## 2019-03-02 MED ORDER — PIPERACILLIN-TAZOBACTAM 3.375 G IVPB
3.3750 g | Freq: Three times a day (TID) | INTRAVENOUS | Status: DC
  Administered 2019-03-02 – 2019-03-03 (×3): 3.375 g via INTRAVENOUS
  Filled 2019-03-02 (×4): qty 50

## 2019-03-02 MED ORDER — HYDROMORPHONE HCL 1 MG/ML IJ SOLN
INTRAMUSCULAR | Status: AC
  Administered 2019-03-02: 0.5 mg via INTRAVENOUS
  Filled 2019-03-02: qty 1

## 2019-03-02 MED ORDER — INSULIN ASPART 100 UNIT/ML ~~LOC~~ SOLN: 0.0000 [IU] | Freq: Every day | SUBCUTANEOUS | Status: DC

## 2019-03-02 MED ORDER — VANCOMYCIN HCL 10 G IV SOLR
1500.0000 mg | Freq: Once | INTRAVENOUS | Status: AC
  Administered 2019-03-02: 19:00:00 1500 mg via INTRAVENOUS
  Filled 2019-03-02: qty 1500

## 2019-03-02 MED ORDER — ONDANSETRON HCL 4 MG/2ML IJ SOLN
INTRAMUSCULAR | Status: DC | PRN
  Administered 2019-03-02: 4 mg via INTRAVENOUS

## 2019-03-02 MED ORDER — PROPOFOL 10 MG/ML IV BOLUS
INTRAVENOUS | Status: AC
  Filled 2019-03-02: qty 20

## 2019-03-02 SURGICAL SUPPLY — 40 items
BAG ZIPLOCK 12X15 (MISCELLANEOUS) ×3 IMPLANT
BANDAGE ESMARK 6X9 LF (GAUZE/BANDAGES/DRESSINGS) ×1 IMPLANT
BNDG ESMARK 6X9 LF (GAUZE/BANDAGES/DRESSINGS) ×3
CHLORAPREP W/TINT 26 (MISCELLANEOUS) ×3 IMPLANT
COVER SURGICAL LIGHT HANDLE (MISCELLANEOUS) ×3 IMPLANT
COVER WAND RF STERILE (DRAPES) IMPLANT
CUFF TOURN SGL QUICK 34 (TOURNIQUET CUFF) ×2
CUFF TRNQT CYL 34X4.125X (TOURNIQUET CUFF) ×1 IMPLANT
DERMABOND ADVANCED (GAUZE/BANDAGES/DRESSINGS) ×2
DERMABOND ADVANCED .7 DNX12 (GAUZE/BANDAGES/DRESSINGS) ×1 IMPLANT
DRAPE SHEET LG 3/4 BI-LAMINATE (DRAPES) ×9 IMPLANT
DRESSING PEEL AND PLAC PRVNA20 (GAUZE/BANDAGES/DRESSINGS) ×1 IMPLANT
DRSG AQUACEL AG ADV 3.5X10 (GAUZE/BANDAGES/DRESSINGS) ×3 IMPLANT
DRSG PEEL AND PLACE PREVENA 20 (GAUZE/BANDAGES/DRESSINGS) ×3
ELECT REM PT RETURN 15FT ADLT (MISCELLANEOUS) ×3 IMPLANT
EVACUATOR 1/8 PVC DRAIN (DRAIN) IMPLANT
GAUZE SPONGE 4X4 12PLY STRL (GAUZE/BANDAGES/DRESSINGS) ×3 IMPLANT
GLOVE BIO SURGEON STRL SZ8.5 (GLOVE) ×6 IMPLANT
GLOVE BIOGEL PI IND STRL 8.5 (GLOVE) ×1 IMPLANT
GLOVE BIOGEL PI INDICATOR 8.5 (GLOVE) ×2
GOWN SPEC L3 XXLG W/TWL (GOWN DISPOSABLE) ×3 IMPLANT
GOWN STRL REUS W/TWL LRG LVL3 (GOWN DISPOSABLE) ×3 IMPLANT
HANDPIECE INTERPULSE COAX TIP (DISPOSABLE) ×2
KIT BASIN OR (CUSTOM PROCEDURE TRAY) ×3 IMPLANT
KIT DRSG PREVENA PLUS 7DAY 125 (MISCELLANEOUS) ×3 IMPLANT
KIT TURNOVER KIT A (KITS) IMPLANT
MANIFOLD NEPTUNE II (INSTRUMENTS) ×3 IMPLANT
PACK TOTAL JOINT (CUSTOM PROCEDURE TRAY) ×3 IMPLANT
PADDING CAST COTTON 6X4 STRL (CAST SUPPLIES) ×3 IMPLANT
PROTECTOR NERVE ULNAR (MISCELLANEOUS) ×3 IMPLANT
SET HNDPC FAN SPRY TIP SCT (DISPOSABLE) ×1 IMPLANT
STAPLER VISISTAT 35W (STAPLE) ×3 IMPLANT
SUT ETHILON 2 0 PSLX (SUTURE) ×6 IMPLANT
SUT MNCRL AB 4-0 PS2 18 (SUTURE) ×3 IMPLANT
SUT VIC AB 1 CT1 36 (SUTURE) ×6 IMPLANT
SUT VIC AB 2-0 CT1 27 (SUTURE) ×6
SUT VIC AB 2-0 CT1 TAPERPNT 27 (SUTURE) ×3 IMPLANT
SWAB COLLECTION DEVICE MRSA (MISCELLANEOUS) ×3 IMPLANT
SWAB CULTURE ESWAB REG 1ML (MISCELLANEOUS) ×3 IMPLANT
TOWEL OR 17X26 10 PK STRL BLUE (TOWEL DISPOSABLE) ×6 IMPLANT

## 2019-03-02 NOTE — Op Note (Signed)
OPERATIVE REPORT   03/02/2019  4:32 PM  PATIENT:  Paula Hall   SURGEON:  Bertram Savin, MD  ASSISTANT: Sherlean Foot, RNFA.Marland Kitchen   PREOPERATIVE DIAGNOSIS: Surgical wound dehiscence right knee.  POSTOPERATIVE DIAGNOSIS:  Same.  PROCEDURE: 1.  Excisional debridement of skin and subcutaneous tissue right knee. 2.  Closure of right knee wound. 3.  Application of negative pressure incisional dressing.  ANESTHESIA:   GETA.  ANTIBIOTICS: 2 g Ancef.  IMPLANTS: None.  SPECIMENS: Right knee superficial swab for culture.  COMPLICATIONS: None.  DISPOSITION: Stable to PACU.  SURGICAL INDICATIONS:  SHENAN CANCILLA is a 58 y.o. female who underwent primary right total knee arthroplasty on 02/02/2019.  Patient has a history of diabetes and tobacco abuse.  She unfortunately admits to breaking her smoking cessation.  She developed skin edge necrosis and dehiscence of her surgical incision.  She was indicated for debridement and closure.  We discussed the risk, benefits, and alternatives.  She elected to proceed.  PROCEDURE IN DETAIL: The patient was identified in the holding area using 2 identifiers.  The surgical site was marked by myself.  She was taken to the operating room and placed supine on the operating room table.  General anesthesia was induced.  Nonsterile tourniquet was applied to the right upper thigh but not used.  The right knee was prepped and draped in the normal sterile surgical fashion.  Timeout was called, verifying site and site of surgery.  She did receive IV antibiotics within 60 minutes of beginning the procedure.  Using a #10 blade, I excisionally debrided necrotic skin edges including underlying subcutaneous fat.  The deep fatty tissue suture line was actually intact.  I ended up opening up the proximal two thirds of her incision.  I removed the sutures and digitally explored down to the fascia.  The fascia was completely intact.  I did not enter the fascia.  There are  no fluid collections or evidence of infection.  Wound culture was performed.  The wound was copiously irrigated with 3 L of normal saline using pulse lavage.  The wound was closed in layers using 2-0 Monocryl for the deep dermal layer followed by 2-0 interrupted vertical mattress sutures for the skin. Prevena incisional wound VAC was applied according to manufacturer's instructions with excellent seal.  She was then extubated and taken to the PACU in stable condition.  Sponge, needle, and instrument counts were correct at the end of the case x2.  There were no known complications.  POSTOPERATIVE PLAN: Postoperatively, the patient be admitted for overnight observation.  IV antibiotics overnight.  Weightbearing as tolerated right lower extremity.  We will convert the house VAC to a portable Prevena suction unit upon discharge.  Plan for discharge on oral antibiotics.  Return to the office 7 days after discharge for removal of the incisional wound VAC.

## 2019-03-02 NOTE — Discharge Instructions (Signed)
Keep VAC dressing clean and dry.  Do not remove. Charge VAC unit nightly. Call [phone] as soon as possible to schedule a follow-up appointment in 7 days for wound VAC removal.

## 2019-03-02 NOTE — Anesthesia Procedure Notes (Signed)
Procedure Name: LMA Insertion Date/Time: 03/02/2019 3:25 PM Performed by: West Pugh, CRNA Pre-anesthesia Checklist: Patient identified, Emergency Drugs available, Suction available, Patient being monitored and Timeout performed Patient Re-evaluated:Patient Re-evaluated prior to induction Oxygen Delivery Method: Circle system utilized Preoxygenation: Pre-oxygenation with 100% oxygen Induction Type: IV induction Ventilation: Mask ventilation without difficulty LMA: LMA inserted LMA Size: 4.0 Number of attempts: 2 Placement Confirmation: positive ETCO2 and breath sounds checked- equal and bilateral Tube secured with: Tape Dental Injury: Teeth and Oropharynx as per pre-operative assessment  Comments: LMA #4 Proseal did not seat. LMA #4 regular seated with adequate volumes.

## 2019-03-02 NOTE — Anesthesia Preprocedure Evaluation (Addendum)
Anesthesia Evaluation  Patient identified by MRN, date of birth, ID band Patient awake    Reviewed: Allergy & Precautions, H&P , NPO status , Patient's Chart, lab work & pertinent test results  Airway Mallampati: II   Neck ROM: full    Dental   Pulmonary asthma , sleep apnea , Current Smoker and Patient abstained from smoking.,  Vocal cord polyps   breath sounds clear to auscultation       Cardiovascular hypertension, Pt. on medications  Rhythm:regular Rate:Normal  ECG: rate 61. Sinus rhythm with 1st degree A-V block Left axis deviation   Neuro/Psych PSYCHIATRIC DISORDERS Depression CVA    GI/Hepatic Neg liver ROS, GERD  ,  Endo/Other  diabetes, Oral Hypoglycemic AgentsHypothyroidism   Renal/GU negative Renal ROS     Musculoskeletal  (+) Arthritis ,   Abdominal   Peds  Hematology HLD   Anesthesia Other Findings Wound dehiscence  Reproductive/Obstetrics                            Anesthesia Physical Anesthesia Plan  ASA: III  Anesthesia Plan: General   Post-op Pain Management:    Induction: Intravenous  PONV Risk Score and Plan: 2 and Ondansetron, Dexamethasone, Treatment may vary due to age or medical condition and Midazolam  Airway Management Planned: LMA  Additional Equipment:   Intra-op Plan:   Post-operative Plan:   Informed Consent: I have reviewed the patients History and Physical, chart, labs and discussed the procedure including the risks, benefits and alternatives for the proposed anesthesia with the patient or authorized representative who has indicated his/her understanding and acceptance.       Plan Discussed with: CRNA, Anesthesiologist and Surgeon  Anesthesia Plan Comments:         Anesthesia Quick Evaluation

## 2019-03-02 NOTE — Transfer of Care (Signed)
Immediate Anesthesia Transfer of Care Note  Patient: Paula Hall  Procedure(s) Performed: IRRIGATION AND DEBRIDEMENT KNEE (Right Knee)  Patient Location: PACU  Anesthesia Type:General  Level of Consciousness: awake, alert , oriented and patient cooperative  Airway & Oxygen Therapy: Patient Spontanous Breathing and Patient connected to face mask oxygen  Post-op Assessment: Report given to RN and Post -op Vital signs reviewed and stable  Post vital signs: Reviewed and stable  Last Vitals:  Vitals Value Taken Time  BP 159/110 03/02/19 1700  Temp    Pulse 80 03/02/19 1701  Resp 12 03/02/19 1701  SpO2 100 % 03/02/19 1701  Vitals shown include unvalidated device data.  Last Pain:  Vitals:   03/02/19 1220  TempSrc: Oral  PainSc:          Complications: No apparent anesthesia complications

## 2019-03-02 NOTE — Progress Notes (Signed)
Pharmacy Antibiotic Note  Paula Hall is a 58 y.o. female admitted on 03/02/2019 with right knee surgical wound dehiscence s/p I&D.  Pharmacy has been consulted for vanc/zosyn dosing.  Plan:  Vanc 1500mg  x 1 then 1250mg  IV q36 - goal AUC 400-550  Zosyn 3.375g IV q8 (extended interval infusion) for CrCl > 20 ml/min  Narrow Abx's ASAP  Daily SCr  Height: 4\' 11"  (149.9 cm) Weight: 163 lb 6.4 oz (74.1 kg) IBW/kg (Calculated) : 43.2  Temp (24hrs), Avg:98.5 F (36.9 C), Min:98.3 F (36.8 C), Max:98.7 F (37.1 C)  Recent Labs  Lab 03/02/19 1230  WBC 5.5  CREATININE 0.81    Estimated Creatinine Clearance: 67.3 mL/min (by C-G formula based on SCr of 0.81 mg/dL).    Allergies  Allergen Reactions  . Shellfish Allergy Shortness Of Breath and Nausea And Vomiting  . Contrast Media [Iodinated Diagnostic Agents] Nausea And Vomiting  . Metrizamide Nausea And Vomiting     Thank you for allowing pharmacy to be a part of this patient's care.  Kara Mead 03/02/2019 6:23 PM

## 2019-03-02 NOTE — H&P (Signed)
PREOPERATIVE H&P  Chief Complaint: Wound dehiscence  HPI: Paula Hall is a 58 y.o. female who presents for preoperative history and physical with a diagnosis of right knee surgical Wound dehiscence.  She has elected for surgical management.   Past Medical History:  Diagnosis Date  . Abdominal hernia   . Anemia   . Arthritis   . Asthma   . Complication of anesthesia    woke up during procedure  . Depression   . Diabetes ([place])   . GERD (gastroesophageal reflux disease)   . Heart murmur   . Hypertension   . Hypothyroid   . Sleep apnea    does not wear CPAP  . Stroke [facility])    " mild stroke years ago"  . Vocal cord polyps    B/L   Past Surgical History:  Procedure Laterality Date  . BREAST BIOPSY    . BREAST SURGERY      left breast biopsy  . CYST EXCISION  2017   abdomen  . FOOT SURGERY    . GASTRIC BYPASS  2006  . HERNIA REPAIR  2015  . KNEE ARTHROPLASTY Right 02/02/2019   Procedure: COMPUTER ASSISTED TOTAL KNEE ARTHROPLASTY;  Surgeon: Rod Can, MD;  Location: [facility];  Service: Orthopedics;  Laterality: Right;  . MICROLARYNGOSCOPY Right 04/08/2017   Procedure: MICROLARYNGOSCOPY, BILATERAL VOCAL CORD POLYP REMOVAL;  Surgeon: [name], MD;  Location: [facility];  Service: ENT;  Laterality: Right;   Social History   Socioeconomic History  . Marital status: Single    Spouse name: Not on file  . Number of children: Not on file  . Years of education: Not on file  . Highest education level: Not on file  Occupational History  . Occupation: retired  Scientific laboratory technician  . Financial resource strain: Not on file  . Food insecurity    Worry: Not on file    Inability: Not on file  . Transportation needs    Medical: Not on file    Non-medical: Not on file  Tobacco Use  . Smoking status: Current Every Day Smoker    Types: Cigarettes  . Smokeless tobacco: Never Used  Substance and Sexual Activity  . Alcohol use: Not Currently  . Drug use: No  . Sexual activity: Never     Birth control/protection: None  Lifestyle  . Physical activity    Days per week: Not on file    Minutes per session: Not on file  . Stress: Not on file  Relationships  . Social Herbalist on phone: Not on file    Gets together: Not on file    Attends religious service: Not on file    Active member of club or organization: Not on file    Attends meetings of clubs or organizations: Not on file    Relationship status: Not on file  Other Topics Concern  . Not on file  Social History Narrative  . Not on file   Family History  Problem Relation Age of Onset  . Kidney cancer Mother   . Other Father        murder  . Colon cancer Neg Hx   . Stomach cancer Neg Hx    Allergies  Allergen Reactions  . Shellfish Allergy Shortness Of Breath and Nausea And Vomiting  . Contrast Media [Iodinated Diagnostic Agents] Nausea And Vomiting  . Metrizamide Nausea And Vomiting   Prior to Admission medications   Medication Sig Start Date End Date  Taking? Authorizing Provider  albuterol (PROVENTIL HFA;VENTOLIN HFA) 108 (90 Base) MCG/ACT inhaler Inhale 1-2 puffs into the lungs every 6 (six) hours as needed for wheezing or shortness of breath.    Yes [provider]  amLODipine (NORVASC) 10 MG tablet Take 10 mg by mouth daily.   Yes [provider]  aspirin 81 MG chewable tablet Chew 1 tablet (81 mg total) by mouth 2 (two) times daily. 02/03/19 03/20/19 Yes Dmarius Reeder, Aaron Edelman, MD  betamethasone dipropionate (DIPROLENE) 0.05 % cream Apply 1 application topically daily as needed (rash neck and arms).    Yes [provider]  Calcium Citrate-Vitamin D (CALCIUM + D PO) Take 600 mg by mouth 2 (two) times daily.    Yes [provider]  celecoxib (CELEBREX) 200 MG capsule Take 1 capsule (200 mg total) by mouth daily. 02/03/19  Yes Derek Huneycutt, Aaron Edelman, MD  cephALEXin (KEFLEX) 500 MG capsule Take 500 mg by mouth 2 (two) times daily. 02/25/19  Yes [provider]   Cholecalciferol (VITAMIN D) 50 MCG (2000 UT) tablet Take 2,000 Units by mouth daily.   Yes [provider]  clonazePAM (KLONOPIN) 0.5 MG tablet Take 1 tablet (0.5 mg total) by mouth daily as needed for anxiety. 02/08/19  Yes Arfeen, Arlyce [place], MD  docusate sodium (COLACE) 100 MG capsule Take 1 capsule (100 mg total) by mouth 2 (two) times daily. 02/03/19  Yes Candido Flott, Aaron Edelman, MD  ferrous sulfate 325 (65 FE) MG tablet Take 325 mg by mouth 2 (two) times daily.   Yes [provider]  fluticasone (FLONASE) 50 MCG/ACT nasal spray Place 1 spray into both nostrils daily as needed for allergies or rhinitis.   Yes [provider]  HYDROcodone-acetaminophen (NORCO) 10-325 MG tablet Take 1 tablet by mouth 3 (three) times daily as needed. 02/23/19  Yes [provider]  Lancets (FREESTYLE) lancets 1 each by Other route as needed for other. Use as instructed   Yes [provider]  levothyroxine (SYNTHROID, LEVOTHROID) 137 MCG tablet Take 137 mcg by mouth daily before breakfast.  05/26/18  Yes [provider]  metFORMIN (GLUCOPHAGE) 500 MG tablet Take 500 mg by mouth 2 (two) times daily with a meal.    Yes [provider]  Multiple Vitamin (MULTIVITAMIN WITH MINERALS) TABS tablet Take 1 tablet by mouth daily.   Yes [provider]  Omega-3 Fatty Acids (FISH OIL) 1000 MG CAPS Take 1,000 mg by mouth daily.   Yes [provider]  omeprazole (PRILOSEC) 40 MG capsule Take 40 mg by mouth daily.   Yes [provider]  QUEtiapine (SEROQUEL) 50 MG tablet Take one tab at bed time. Patient taking differently: Take 50 mg by mouth at bedtime.  02/08/19  Yes Arfeen, Arlyce [place], MD  rosuvastatin (CRESTOR) 20 MG tablet Take 20 mg by mouth every evening.   Yes [provider]  vitamin C (ASCORBIC ACID) 500 MG tablet Take 500 mg by mouth daily.   Yes [provider]  hydrOXYzine (ATARAX/VISTARIL) 25 MG tablet Take 1 tablet (25 mg  total) by mouth at bedtime. Patient taking differently: Take 25-50 mg by mouth at bedtime.  02/08/19   Arfeen, Arlyce [place], MD  ondansetron (ZOFRAN) 4 MG tablet Take 1 tablet (4 mg total) by mouth every 6 (six) hours as needed for nausea. 02/03/19   Emmerson Shuffield, Aaron Edelman, MD  senna (SENOKOT) 8.6 MG TABS tablet Take 1 tablet (8.6 mg total) by mouth 2 (two) times daily. Patient not taking: Reported on  02/28/2019 02/03/19   Dal Blew, Aaron Edelman, MD     Positive ROS: All other systems have been reviewed and were otherwise negative with the exception of those mentioned in the HPI and as above.  Physical Exam: General: Alert, no acute distress Cardiovascular: No pedal edema Respiratory: No cyanosis, no use of accessory musculature GI: No organomegaly, abdomen is soft and non-tender Skin: No lesions in the area of chief complaint Neurologic: Sensation intact distally Psychiatric: Patient is competent for consent with normal mood and affect Lymphatic: No axillary or cervical lymphadenopathy  MUSCULOSKELETAL: R knee with dehiscence of skin with necrotic fatty and skin tissue.  Assessment: Wound dehiscence  Plan: Plan for Procedure(s): IRRIGATION AND DEBRIDEMENT KNEE  The risks benefits and alternatives were discussed with the patient including but not limited to the risks of nonoperative treatment, versus surgical intervention including infection, bleeding, nerve injury,  blood clots, cardiopulmonary complications, morbidity, mortality, among others, and they were willing to proceed.   Bertram Savin, MD [phone]   03/02/2019 1:58 PM

## 2019-03-03 ENCOUNTER — Encounter (HOSPITAL_COMMUNITY): Payer: Self-pay | Admitting: Orthopedic Surgery

## 2019-03-03 DIAGNOSIS — E119 Type 2 diabetes mellitus without complications: Secondary | ICD-10-CM | POA: Diagnosis not present

## 2019-03-03 DIAGNOSIS — Z96651 Presence of right artificial knee joint: Secondary | ICD-10-CM | POA: Diagnosis not present

## 2019-03-03 DIAGNOSIS — J45909 Unspecified asthma, uncomplicated: Secondary | ICD-10-CM | POA: Diagnosis not present

## 2019-03-03 DIAGNOSIS — G4730 Sleep apnea, unspecified: Secondary | ICD-10-CM | POA: Diagnosis not present

## 2019-03-03 DIAGNOSIS — Z79899 Other long term (current) drug therapy: Secondary | ICD-10-CM | POA: Diagnosis not present

## 2019-03-03 DIAGNOSIS — I10 Essential (primary) hypertension: Secondary | ICD-10-CM | POA: Diagnosis not present

## 2019-03-03 DIAGNOSIS — K219 Gastro-esophageal reflux disease without esophagitis: Secondary | ICD-10-CM | POA: Diagnosis not present

## 2019-03-03 DIAGNOSIS — E039 Hypothyroidism, unspecified: Secondary | ICD-10-CM | POA: Diagnosis not present

## 2019-03-03 DIAGNOSIS — Z8673 Personal history of transient ischemic attack (TIA), and cerebral infarction without residual deficits: Secondary | ICD-10-CM | POA: Diagnosis not present

## 2019-03-03 DIAGNOSIS — Z791 Long term (current) use of non-steroidal anti-inflammatories (NSAID): Secondary | ICD-10-CM | POA: Diagnosis not present

## 2019-03-03 DIAGNOSIS — Z7984 Long term (current) use of oral hypoglycemic drugs: Secondary | ICD-10-CM | POA: Diagnosis not present

## 2019-03-03 DIAGNOSIS — T8131XA Disruption of external operation (surgical) wound, not elsewhere classified, initial encounter: Secondary | ICD-10-CM | POA: Diagnosis not present

## 2019-03-03 DIAGNOSIS — D649 Anemia, unspecified: Secondary | ICD-10-CM | POA: Diagnosis not present

## 2019-03-03 DIAGNOSIS — Z9884 Bariatric surgery status: Secondary | ICD-10-CM | POA: Diagnosis not present

## 2019-03-03 LAB — GLUCOSE, CAPILLARY
Glucose-Capillary: 115 mg/dL — ABNORMAL HIGH (ref 70–99)
Glucose-Capillary: 93 mg/dL (ref 70–99)

## 2019-03-03 LAB — CREATININE, SERUM
Creatinine, Ser: 0.81 mg/dL (ref 0.44–1.00)
GFR calc Af Amer: >60 mL/min (ref >60)
GFR calc non Af Amer: >60 mL/min (ref >60)

## 2019-03-03 MED ORDER — DOXYCYCLINE HYCLATE 100 MG PO CAPS: 100.0000 mg | ORAL_CAPSULE | Freq: Two times a day (BID) | ORAL | 0 refills | Status: AC

## 2019-03-03 NOTE — Discharge Summary (Signed)
Physician Discharge Summary  Patient ID: Paula Hall MRN: [id number] DOB/AGE: 57-May-1962 57 y.o.  Admit date: 03/02/2019 Discharge date: 03/03/2019  Admission Diagnoses:  Surgical wound dehiscence  Discharge Diagnoses:  Principal Problem:   Surgical wound dehiscence Active Problems:   Wound infection   Past Medical History:  Diagnosis Date  . Abdominal hernia   . Anemia   . Arthritis   . Asthma   . Complication of anesthesia    woke up during procedure  . Depression   . Diabetes ([place])   . GERD (gastroesophageal reflux disease)   . Heart murmur   . Hypertension   . Hypothyroid   . Sleep apnea    does not wear CPAP  . Stroke [facility])    " mild stroke years ago"  . Vocal cord polyps    B/L    Surgeries: Procedure(s): IRRIGATION AND DEBRIDEMENT KNEE on 03/02/2019   Consultants (if any):   Discharged Condition: Improved  Hospital Course: Paula Hall is an 58 y.o. female who was admitted 03/02/2019 with a diagnosis of Surgical wound dehiscence and went to the operating room on 03/02/2019 and underwent the above named procedures.    She was given perioperative antibiotics:  Anti-infectives (From admission, onward)   Start     Dose/Rate Route Frequency Ordered Stop   03/04/19 0600  vancomycin (VANCOCIN) 1,250 mg in sodium chloride 0.9 % 250 mL IVPB  Status:  Discontinued     1,250 mg 166.7 mL/hr over 90 Minutes Intravenous Every 36 hours 03/02/19 1828 03/03/19 1920   03/03/19 0000  doxycycline (VIBRAMYCIN) 100 MG capsule     100 mg Oral 2 times daily 03/03/19 1136 03/17/19 2359   03/02/19 2000  piperacillin-tazobactam (ZOSYN) IVPB 3.375 g  Status:  Discontinued     3.375 g 12.5 mL/hr over 240 Minutes Intravenous Every 8 hours 03/02/19 1828 03/03/19 1920   03/02/19 1830  vancomycin (VANCOCIN) 1,500 mg in sodium chloride 0.9 % 500 mL IVPB     1,500 mg 250 mL/hr over 120 Minutes Intravenous  Once 03/02/19 1828 03/02/19 2039   03/02/19 1215  ceFAZolin (ANCEF) IVPB  2g/100 mL premix     2 g 200 mL/hr over 30 Minutes Intravenous On call to O.R. 03/02/19 1201 03/02/19 1556    .  She was given sequential compression devices, early ambulation, and lovenox for DVT prophylaxis.  She benefited maximally from the hospital stay and there were no complications.    Recent vital signs:  Vitals:   03/03/19 1337 03/03/19 1444  BP: 131/65   Pulse: 83   Resp: 20   Temp: 99.7 F (37.6 C) 99.8 F (37.7 C)  SpO2: 94%     Recent laboratory studies:  Lab Results  Component Value Date   HGB 11.2 (L) 03/02/2019   HGB 9.7 (L) 02/03/2019   HGB 12.2 01/31/2019   Lab Results  Component Value Date   WBC 5.5 03/02/2019   PLT 317 03/02/2019   Lab Results  Component Value Date   INR 1.0 03/02/2019   Lab Results  Component Value Date   NA 137 03/02/2019   K 4.5 03/02/2019   CL 103 03/02/2019   CO2 25 03/02/2019   BUN 12 03/02/2019   CREATININE 0.81 03/03/2019   GLUCOSE 111 (H) 03/02/2019    Discharge Medications:   Allergies as of 03/03/2019      Reactions   Shellfish Allergy Shortness Of Breath, Nausea And Vomiting   Contrast Media [iodinated Diagnostic Agents]  Nausea And Vomiting   Metrizamide Nausea And Vomiting      Medication List    STOP taking these medications   cephALEXin 500 MG capsule Commonly known as: KEFLEX     TAKE these medications   albuterol 108 (90 Base) MCG/ACT inhaler Commonly known as: VENTOLIN HFA Inhale 1-2 puffs into the lungs every 6 (six) hours as needed for wheezing or shortness of breath.   amLODipine 10 MG tablet Commonly known as: NORVASC Take 10 mg by mouth daily.   aspirin 81 MG chewable tablet Chew 1 tablet (81 mg total) by mouth 2 (two) times daily.   betamethasone dipropionate 0.05 % cream Apply 1 application topically daily as needed (rash neck and arms).   CALCIUM + D PO Take 600 mg by mouth 2 (two) times daily.   celecoxib 200 MG capsule Commonly known as: CELEBREX Take 1 capsule (200 mg  total) by mouth daily.   clonazePAM 0.5 MG tablet Commonly known as: KLONOPIN Take 1 tablet (0.5 mg total) by mouth daily as needed for anxiety.   docusate sodium 100 MG capsule Commonly known as: COLACE Take 1 capsule (100 mg total) by mouth 2 (two) times daily.   doxycycline 100 MG capsule Commonly known as: VIBRAMYCIN Take 1 capsule (100 mg total) by mouth 2 (two) times daily for 14 days.   ferrous sulfate 325 (65 FE) MG tablet Take 325 mg by mouth 2 (two) times daily.   Fish Oil 1000 MG Caps Take 1,000 mg by mouth daily.   fluticasone 50 MCG/ACT nasal spray Commonly known as: FLONASE Place 1 spray into both nostrils daily as needed for allergies or rhinitis.   freestyle lancets 1 each by Other route as needed for other. Use as instructed   HYDROcodone-acetaminophen 10-325 MG tablet Commonly known as: NORCO Take 1 tablet by mouth 3 (three) times daily as needed.   hydrOXYzine 25 MG tablet Commonly known as: ATARAX/VISTARIL Take 1 tablet (25 mg total) by mouth at bedtime. What changed: how much to take   levothyroxine 137 MCG tablet Commonly known as: SYNTHROID Take 137 mcg by mouth daily before breakfast.   metFORMIN 500 MG tablet Commonly known as: GLUCOPHAGE Take 500 mg by mouth 2 (two) times daily with a meal.   multivitamin with minerals Tabs tablet Take 1 tablet by mouth daily.   omeprazole 40 MG capsule Commonly known as: PRILOSEC Take 40 mg by mouth daily.   ondansetron 4 MG tablet Commonly known as: ZOFRAN Take 1 tablet (4 mg total) by mouth every 6 (six) hours as needed for nausea.   QUEtiapine 50 MG tablet Commonly known as: SEROQUEL Take one tab at bed time. What changed:   how much to take  how to take this  when to take this  additional instructions   rosuvastatin 20 MG tablet Commonly known as: CRESTOR Take 20 mg by mouth every evening.   senna 8.6 MG Tabs tablet Commonly known as: SENOKOT Take 1 tablet (8.6 mg total) by mouth  2 (two) times daily.   vitamin C 500 MG tablet Commonly known as: ASCORBIC ACID Take 500 mg by mouth daily.   Vitamin D 50 MCG (2000 UT) tablet Take 2,000 Units by mouth daily.       Diagnostic Studies: No results found.  Disposition:    Discharge Instructions    Call MD / Call 911   Complete by: As directed    If you experience chest pain or shortness of breath, CALL 911  and be transported to the hospital emergency room.  If you develope a fever above 101 F, pus (white drainage) or increased drainage or redness at the wound, or calf pain, call your surgeon's office.   Constipation Prevention   Complete by: As directed    Drink plenty of fluids.  Prune juice may be helpful.  You may use a stool softener, such as Colace (over the counter) 100 mg twice a day.  Use MiraLax (over the counter) for constipation as needed.   Diet - low sodium heart healthy   Complete by: As directed    Discharge instructions   Complete by: As directed    Keep VAC dressing clean and dry. Do not remove. Charge VAC unit nightly.  STOP SMOKING.   Increase activity slowly as tolerated   Complete by: As directed       Follow-up Information    Aquila Menzie, Aaron Edelman, MD. Schedule an appointment as soon as possible for a visit on 03/10/2019.   Specialty: Orthopedic Surgery Why: [facility] removal Contact information: [address] [place] [address] [id number]            Signed: Hilton Cork Nichele Slawson 03/04/2019, 1:55 PM

## 2019-03-03 NOTE — Progress Notes (Signed)
Discharge teaching complete with patient and her sister regarding the would vac, follow up appointment, activity, bathing, and medications. Encouraged to keep her blood sugar in normal linitis and stop smoking.

## 2019-03-03 NOTE — Progress Notes (Signed)
Subjective:  Patient reports pain as mild to moderate.  Denies N/V/CP/SOB. Patient admits to smoking 1 PPD.  Objective:   VITALS:   Vitals:   03/02/19 2218 03/03/19 0136 03/03/19 0459 03/03/19 1008  BP: 123/68 117/61 (!) 110/59 122/65  Pulse: 71 63 (!) 57 92  Resp: 14 14 16 18   Temp: 98.9 F (37.2 C) 98.6 F (37 C) (!) 97.3 F (36.3 C) 99.1 F (37.3 C)  TempSrc: Oral Oral Axillary Oral  SpO2: 95% 97% 100% 94%  Weight:      Height:        NAD ABD soft Sensation intact distally Intact pulses distally Dorsiflexion/Plantar flexion intact iVAC intact  Lab Results  Component Value Date   WBC 5.5 03/02/2019   HGB 11.2 (L) 03/02/2019   HCT 34.9 (L) 03/02/2019   MCV 90.4 03/02/2019   PLT 317 03/02/2019   BMET    Component Value Date/Time   NA 137 03/02/2019 1230   K 4.5 03/02/2019 1230   CL 103 03/02/2019 1230   CO2 25 03/02/2019 1230   GLUCOSE 111 (H) 03/02/2019 1230   BUN 12 03/02/2019 1230   CREATININE 0.81 03/03/2019 0257   CALCIUM 9.0 03/02/2019 1230   GFRNONAA >60 03/03/2019 0257   GFRAA >60 03/03/2019 0257    Recent Results (from the past 240 hour(s))  Novel Coronavirus, NAA (Hosp order, Send-out to Ref Lab; TAT 18-24 hrs     Status: None   Collection Time: 02/26/19 10:01 AM   Specimen: Nasopharyngeal Swab; Respiratory  Result Value Ref Range Status   SARS-CoV-2, NAA NOT DETECTED NOT DETECTED Final    Comment: (NOTE) This nucleic acid amplification test was developed and its performance characteristics determined by [employer]. Nucleic acid amplification tests include PCR and TMA. This test has not been FDA cleared or approved. This test has been authorized by FDA under an Emergency Use Authorization (EUA). This test is only authorized for the duration of time the declaration that circumstances exist justifying the authorization of the emergency use of in vitro diagnostic tests for detection of SARS-CoV-2 virus and/or diagnosis of  COVID-19 infection under section 564(b)(1) of the Act, 21 U.S.C. [id number]) (1), unless the authorization is terminated or revoked sooner. When diagnostic testing is negative, the possibility of a false negative result should be considered in the context of a patient's recent exposures and the presence of clinical signs and symptoms consistent with COVID-19. An individual without symptoms of COVID- 19 and who is not shedding SARS-CoV-2 vi rus would expect to have a negative (not detected) result in this assay. Performed At: [facility] [address] [place], [place] [id number] Rush Farmer MD [id number]    [place]  Final    Comment: Performed at [facility], [place] [address], [place], [place] [address]  Surgical pcr screen     Status: None   Collection Time: 03/02/19 12:43 PM   Specimen: Nasal Mucosa; Nasal Swab  Result Value Ref Range Status   MRSA, PCR NEGATIVE NEGATIVE Final   Staphylococcus aureus NEGATIVE NEGATIVE Final    Comment: (NOTE) The Xpert SA Assay (FDA approved for NASAL specimens in patients 12 years of age and older), is one component of a comprehensive surveillance program. It is not intended to diagnose infection nor to guide or monitor treatment. Performed at [facility], [place] [address], [place], [place] [address]   Aerobic Culture (superficial specimen)     Status: None (Preliminary result)  Collection Time: 03/02/19  3:58 PM   Specimen: Abscess; Wound  Result Value Ref Range Status   Specimen Description   Final    ABSCESS RIGHT KNEE Performed at [place] [address], [place], [place] [address]    Special Requests   Final    NONE Performed at [facility], [place] [address] [place], [place] [address]    Gram Stain NO WBC SEEN NO ORGANISMS SEEN   Final   Culture   Final    CULTURE REINCUBATED FOR BETTER GROWTH Performed at [facility], [place] [address], [place], [place] [address]    Report Status PENDING  Incomplete      Assessment/Plan: 1 Day Post-Op   Principal Problem:   Surgical wound dehiscence Active Problems:   Wound infection   WBAT with walker DVT ppx: Lovenox, SCDs, TEDS PO pain control IV abx PT/OT Tobacco use: recommended smoking cessation due to risk of delayed wound healing and therefore infection Dispo: d/c home with portable prevena vac    Hilton Cork Carson Meche 03/03/2019, 11:27 AM   Rod Can, MD [phone] [facility] [place] is now [facility]  [place] [address] [place], [place], [place] [address] Phone: [phone] [email or web address] Facebook  [employer]

## 2019-03-04 NOTE — Anesthesia Postprocedure Evaluation (Signed)
Anesthesia Post Note  Patient: Paula Hall  Procedure(s) Performed: IRRIGATION AND DEBRIDEMENT KNEE (Right Knee)     Patient location during evaluation: PACU Anesthesia Type: General Level of consciousness: awake and alert Pain management: pain level controlled Vital Signs Assessment: post-procedure vital signs reviewed and stable Respiratory status: spontaneous breathing, nonlabored ventilation, respiratory function stable and patient connected to nasal cannula oxygen Cardiovascular status: blood pressure returned to baseline and stable Postop Assessment: no apparent nausea or vomiting Anesthetic complications: no    Last Vitals:  Vitals:   03/03/19 1337 03/03/19 1444  BP: 131/65   Pulse: 83   Resp: 20   Temp: 37.6 C 37.7 C  SpO2: 94%     Last Pain:  Vitals:   03/03/19 1530  TempSrc:   PainSc: (P) 5                  Ladd Cen S

## 2019-03-05 LAB — AEROBIC CULTURE W GRAM STAIN (SUPERFICIAL SPECIMEN): Gram Stain: NONE SEEN

## 2019-03-07 LAB — ANAEROBIC CULTURE

## 2019-03-15 ENCOUNTER — Other Ambulatory Visit: Payer: Self-pay

## 2019-03-15 NOTE — Patient Outreach (Signed)
[facility]) Care Management  03/15/2019  JADAYA LAZOR 02-27-1961 [id number]   Medication Adherence call to Mrs. [name] [place] Identifiers Verify spoke with patient she is past due on Metformin 500 mg patient explain she takes 1 tablet 2 times a day,patient had extras reason why she had not order it,patient ask to call [facility] to order this medication and Levotheroxine and Mackie Pai will have it ready for patient to pick up, gave patient the number to [employer] patient wants to start using the mail order,patient was taking therapy,patient was also given the number for free transportation for patient to schedule transportation.Mrs. Silvera is showing past due under [place].   [facility] Management Direct Dial [phone]  Fax [phone] [email or web address]

## 2019-04-11 ENCOUNTER — Other Ambulatory Visit: Payer: Self-pay

## 2019-04-11 ENCOUNTER — Encounter (HOSPITAL_COMMUNITY): Payer: Self-pay | Admitting: Psychiatry

## 2019-04-11 ENCOUNTER — Ambulatory Visit (INDEPENDENT_AMBULATORY_CARE_PROVIDER_SITE_OTHER): Payer: Medicare Other | Admitting: Psychiatry

## 2019-04-11 DIAGNOSIS — F411 Generalized anxiety disorder: Secondary | ICD-10-CM

## 2019-04-11 DIAGNOSIS — F319 Bipolar disorder, unspecified: Secondary | ICD-10-CM

## 2019-04-11 MED ORDER — CLONAZEPAM 0.5 MG PO TABS: 0.5000 mg | ORAL_TABLET | Freq: Every day | ORAL | 1 refills | Status: DC | PRN

## 2019-04-11 MED ORDER — QUETIAPINE FUMARATE 50 MG PO TABS: 50.0000 mg | ORAL_TABLET | Freq: Every day | ORAL | 1 refills | Status: DC

## 2019-04-11 NOTE — Progress Notes (Signed)
Virtual Visit via Telephone Note  I connected with Paula Hall on 04/11/19 at  3:40 PM EST by telephone and verified that I am speaking with the correct person using two identifiers.   I discussed the limitations, risks, security and privacy concerns of performing an evaluation and management service by telephone and the availability of in person appointments. I also discussed with the patient that there may be a patient responsible charge related to this service. The patient expressed understanding and agreed to proceed.   History of Present Illness: Patient was evaluated on the phone.  She is now taking Klonopin 0.5 mg a day and hydroxyzine at bedtime.  She admitted her sleep is somewhat better but she still struggle with irritability, mood swing and anger.  She has not started Seroquel yet.  She apologized that Seroquel is picked up from the pharmacy but not sure why she has not started yet.  She like to start as soon as possible.  Recently she had wound debridement and that is going well.  She lives with her niece.  She has cut down her pain medication.  Now she is requesting appointment with therapy because she realized she has mental health issues.  She denies any mania or psychosis.  She gets overwhelmed with her health as she has multiple health issues.  She denies any suicidal thoughts or homicidal thought.  She denies any violence or aggression.     Past Psychiatric History:Reviewed. H/O psychiatric illness since early age[facility] No h/o suicidal attempt. H/O cutting her wrist. Lastinpatientin 2015. History of paranoia, anxiety. Tried Ambien, Zoloft, Paxil, Thorazine, Depakote, Risperdal, Xanax and olanzapine butclaims nothing worked. Recently we tried [place] but stopped on her own.    Psychiatric Specialty Exam: Physical Exam  ROS  Last menstrual period 08/17/2010.There is no height or weight on file to calculate BMI.  General  Appearance: poor historian  Eye Contact:  NA  Speech:  fast  Volume:  Increased  Mood:  Irritable  Affect:  NA  Thought Process:  Descriptions of Associations: Circumstantial  Orientation:  Full (Time, Place, and Person)  Thought Content:  Paranoid Ideation and Rumination  Suicidal Thoughts:  No  Homicidal Thoughts:  No  Memory:  Immediate;   Fair Recent;   Fair Remote;   Fair  Judgement:  Fair  Insight:  Present  Psychomotor Activity:  NA  Concentration:  Concentration: Fair and Attention Span: Fair  Recall:  [employer] of Knowledge:  Fair  Language:  Fair  Akathisia:  No  Handed:  Right  AIMS (if indicated):     Assets:  Desire for Improvement Housing Social Support  ADL's:  Intact  Cognition:  WNL  Sleep:   fair      Assessment and Plan: Bipolar disorder type I.  Generalized anxiety disorder.  Reinforced to take the medicine as prescribed.  Continue Klonopin 0.5 mg as needed for anxiety since it is helping and recommend to start Seroquel 50 mg at bedtime.  I also explained that if she is sleeping better and her mood is much calmer then she does not have to take hydroxyzine.  We discussed polypharmacy especially she is still take some time pain medicine and not to mix with Klonopin and pain medication.  She acknowledged and agreed with the plan.  She requesting a therapist appointment and we will try to schedule appointment with therapy in this office.  Recommended to call us back if she is any question or  any concern.  Follow-up in 2 months.  Follow Up Instructions:    I discussed the assessment and treatment plan with the patient. The patient was provided an opportunity to ask questions and all were answered. The patient agreed with the plan and demonstrated an understanding of the instructions.   The patient was advised to call back or seek an in-person evaluation if the symptoms worsen or if the condition fails to improve as anticipated.  I provided 20 minutes of  non-face-to-face time during this encounter.   Kathlee Nations, MD

## 2019-04-19 ENCOUNTER — Ambulatory Visit (INDEPENDENT_AMBULATORY_CARE_PROVIDER_SITE_OTHER): Payer: Medicare Other | Admitting: Licensed Clinical Social Worker

## 2019-04-19 ENCOUNTER — Other Ambulatory Visit: Payer: Self-pay

## 2019-04-19 DIAGNOSIS — F411 Generalized anxiety disorder: Secondary | ICD-10-CM | POA: Diagnosis not present

## 2019-04-19 DIAGNOSIS — F319 Bipolar disorder, unspecified: Secondary | ICD-10-CM | POA: Diagnosis not present
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
## 2019-04-25 ENCOUNTER — Other Ambulatory Visit: Payer: Self-pay | Admitting: Family Medicine

## 2019-04-25 DIAGNOSIS — M519 Unspecified thoracic, thoracolumbar and lumbosacral intervertebral disc disorder: Secondary | ICD-10-CM

## 2019-05-25 ENCOUNTER — Other Ambulatory Visit: Payer: Self-pay

## 2019-05-25 ENCOUNTER — Ambulatory Visit
Admission: RE | Admit: 2019-05-25 | Discharge: 2019-05-25 | Disposition: A | Discharge status: Home / Self Care | Payer: Medicare Other | Source: Ambulatory Visit | Attending: Family Medicine | Admitting: Family Medicine

## 2019-05-25 DIAGNOSIS — M519 Unspecified thoracic, thoracolumbar and lumbosacral intervertebral disc disorder: Secondary | ICD-10-CM

## 2019-06-20 ENCOUNTER — Encounter (HOSPITAL_COMMUNITY): Payer: Self-pay | Admitting: Psychiatry

## 2019-06-20 ENCOUNTER — Other Ambulatory Visit: Payer: Self-pay

## 2019-06-20 ENCOUNTER — Ambulatory Visit (INDEPENDENT_AMBULATORY_CARE_PROVIDER_SITE_OTHER): Payer: Medicare Other | Admitting: Psychiatry

## 2019-06-20 DIAGNOSIS — F411 Generalized anxiety disorder: Secondary | ICD-10-CM

## 2019-06-20 DIAGNOSIS — F319 Bipolar disorder, unspecified: Secondary | ICD-10-CM

## 2019-06-20 MED ORDER — CLONAZEPAM 0.5 MG PO TABS: 0.5000 mg | ORAL_TABLET | Freq: Every day | ORAL | 2 refills | Status: AC | PRN

## 2019-06-20 MED ORDER — QUETIAPINE FUMARATE 25 MG PO TABS: 75.0000 mg | ORAL_TABLET | Freq: Every day | ORAL | 2 refills | Status: AC

## 2019-06-20 NOTE — Progress Notes (Signed)
Virtual Visit via Telephone Note  I connected with Paula Hall on 06/20/19 at  3:40 PM EST by telephone and verified that I am speaking with the correct person using two identifiers.   I discussed the limitations, risks, security and privacy concerns of performing an evaluation and management service by telephone and the availability of in person appointments. I also discussed with the patient that there may be a patient responsible charge related to this service. The patient expressed understanding and agreed to proceed.   History of Present Illness: Patient was evaluated on the phone.  She is now taking Seroquel and her sleep is somewhat better but still had moments of irritability, getting upset and anxiety.  She is no longer taking hydroxyzine.  She is in physical therapy for her knee joint.  Patient has surgery but still not able to complete recovery.  She gets wound debridement on a regular basis.  She see her primary care physician Dr. Glendon Axe for other health needs.  She started seeing her previous therapist [place] at [facility] and she reported it is helping her.  She denies any mania, psychosis, suicidal thoughts.  Patient lives with her niece and she reported relationship going well.  She is open to try higher dose of Seroquel to help her anxiety and mood symptoms.  Her energy level is okay.  Her appetite is okay.   Past Psychiatric History:Reviewed. H/Omental illness since early age. [facility]. No h/osuicidal attempt. Last inpatient in 2015. H/Ocutting wrist, paranoia, anxiety. Tried Ambien, Zoloft, Paxil, Thorazine, Depakote, Risperdal, Xanax and olanzapine butclaims nothing worked. Recently we tried [place] but stopped on her own.    Psychiatric Specialty Exam: Physical Exam  Review of Systems  Last menstrual period 08/17/2010.There is no height or weight on file to calculate BMI.  General Appearance: NA  Eye Contact:  NA  Speech:   fast but coherrant  Volume:  Increased  Mood:  Anxious and Irritable  Affect:  NA  Thought Process:  Descriptions of Associations: Intact  Orientation:  Full (Time, Place, and Person)  Thought Content:  Paranoid Ideation  Suicidal Thoughts:  No  Homicidal Thoughts:  No  Memory:  Immediate;   Fair Recent;   Fair Remote;   Fair  Judgement:  Fair  Insight:  Fair  Psychomotor Activity:  NA  Concentration:  Concentration: Fair and Attention Span: Fair  Recall:  [employer] of Knowledge:  Fair  Language:  Good  Akathisia:  No  Handed:  Right  AIMS (if indicated):     Assets:  Communication Skills Desire for Improvement Housing Social Support  ADL's:  Intact  Cognition:  WNL  Sleep:   ok      Assessment and Plan: Bipolar disorder type I.  Generalized anxiety disorder.  Since taking the Seroquel patient noticed improvement in her sleep and mood but continued to have episodes of irritability and insomnia.  Recommend to try Seroquel 75 mg at bedtime.  Continue Klonopin 0.5 mg as needed for anxiety.  She understand that she cannot take pain medicine and Klonopin together.  I will discontinue hydroxyzine since she does not take it anymore.  Encouraged to continue therapy with [place].  Recommended to call us back if she has any question or any concern.  Follow-up in 3 months.  Follow Up Instructions:    I discussed the assessment and treatment plan with the patient. The patient was provided an opportunity to ask questions and all were answered. The  patient agreed with the plan and demonstrated an understanding of the instructions.   The patient was advised to call back or seek an in-person evaluation if the symptoms worsen or if the condition fails to improve as anticipated.  I provided 20 minutes of non-face-to-face time during this encounter.   Kathlee Nations, MD

## 2019-08-08 DIAGNOSIS — Z96659 Presence of unspecified artificial knee joint: Secondary | ICD-10-CM | POA: Insufficient documentation

## 2019-09-15 ENCOUNTER — Ambulatory Visit (HOSPITAL_COMMUNITY): Payer: Medicare Other | Admitting: Psychiatry

## 2020-02-27 ENCOUNTER — Other Ambulatory Visit: Payer: Self-pay | Admitting: Family Medicine

## 2020-02-27 DIAGNOSIS — Z1231 Encounter for screening mammogram for malignant neoplasm of breast: Secondary | ICD-10-CM

## 2020-04-04 ENCOUNTER — Other Ambulatory Visit: Payer: Self-pay

## 2020-04-04 ENCOUNTER — Ambulatory Visit
Admission: RE | Admit: 2020-04-04 | Discharge: 2020-04-04 | Disposition: A | Discharge status: Home / Self Care | Payer: Medicare Other | Source: Ambulatory Visit | Attending: Family Medicine | Admitting: Family Medicine

## 2020-04-04 DIAGNOSIS — Z1231 Encounter for screening mammogram for malignant neoplasm of breast: Secondary | ICD-10-CM

## 2020-08-19 IMAGING — DX DG KNEE 1-2V PORT*R*
3 series · 3 of 3 positions shown · non-contrast
Comparison: None.

CLINICAL DATA: Status post knee replacement.

EXAM:
PORTABLE RIGHT KNEE - 1-2 VIEW

[knee ap]
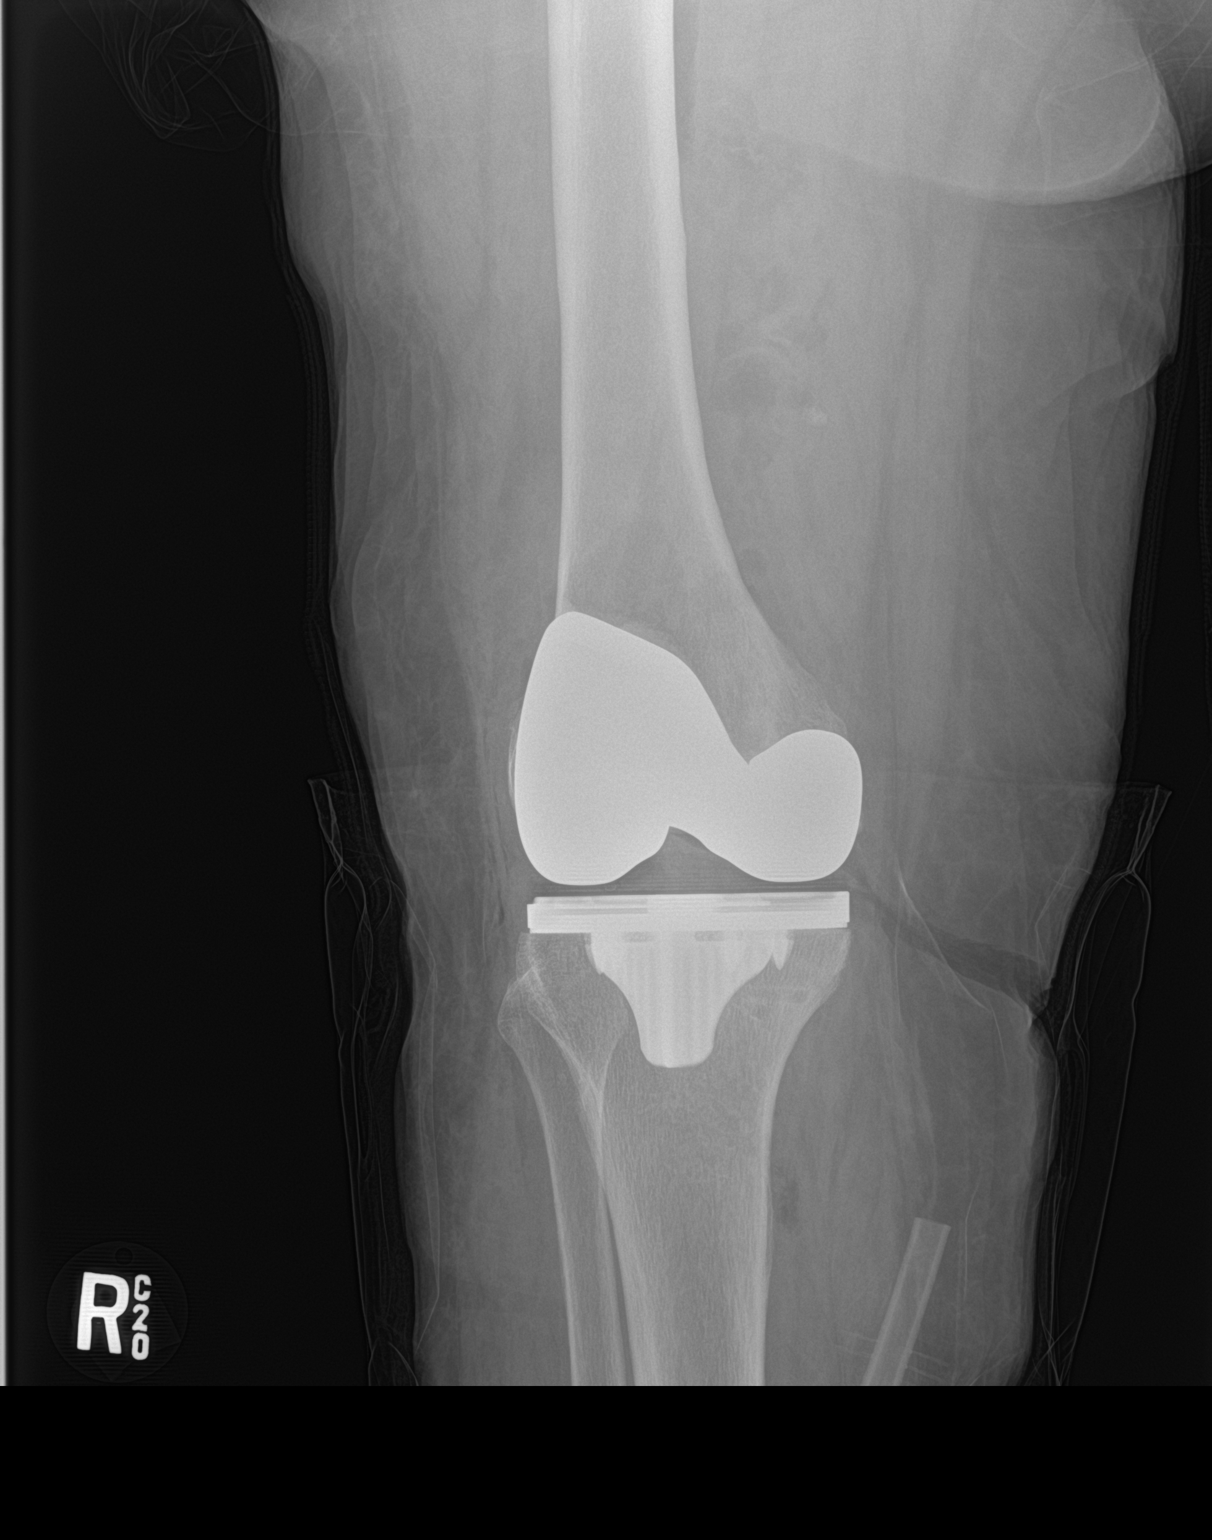

[knee lat (1 of 2)]
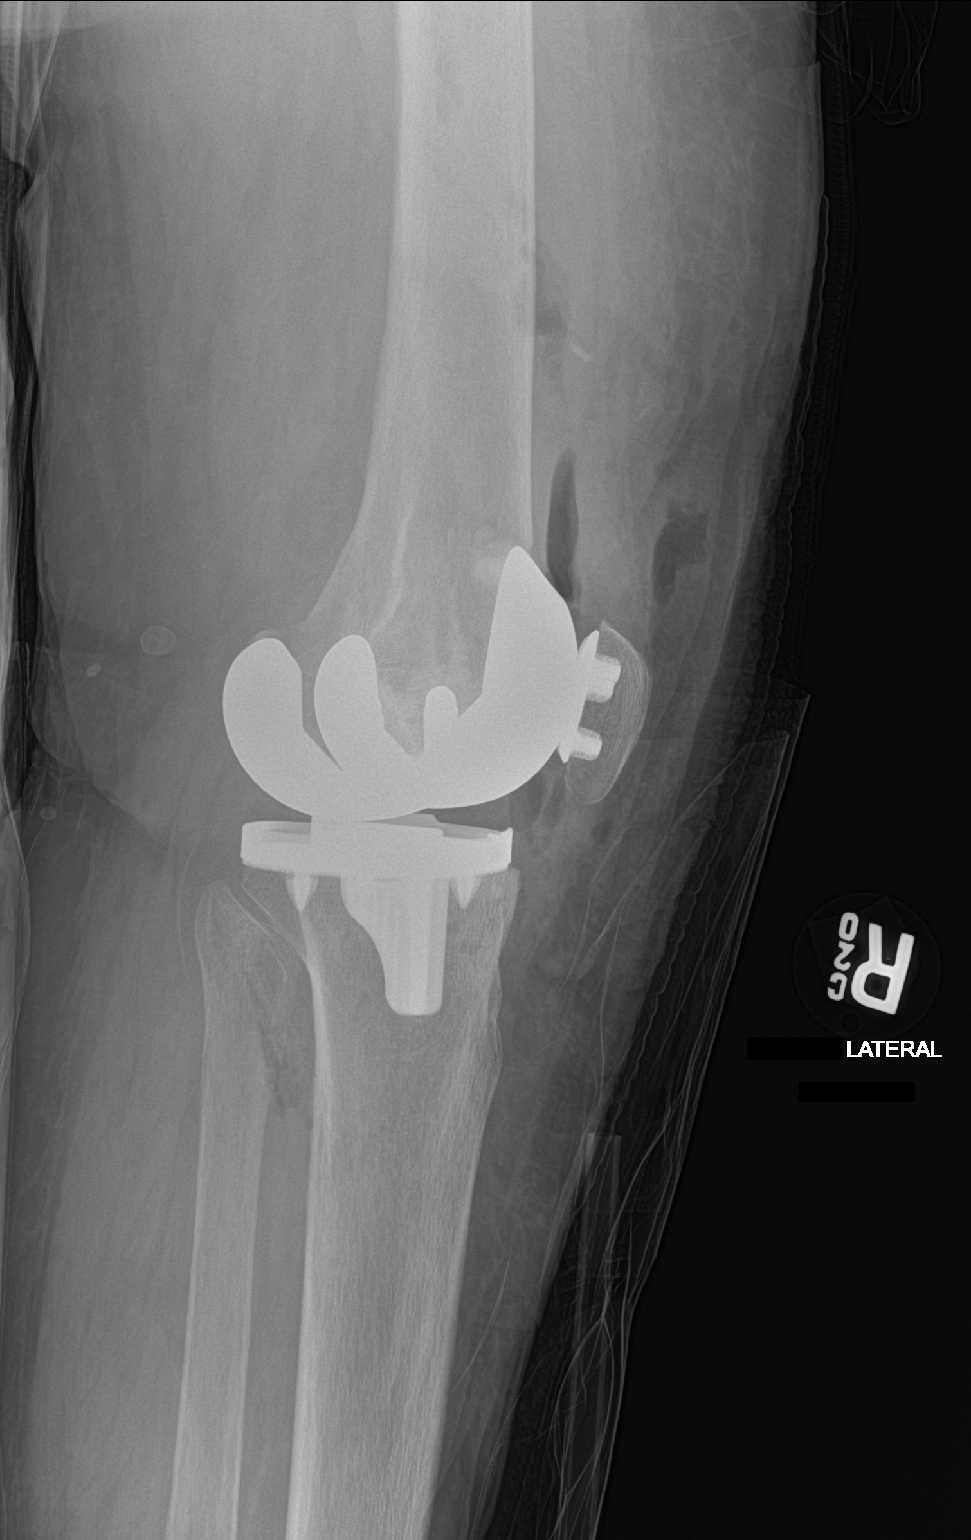

[knee lat (2 of 2)]
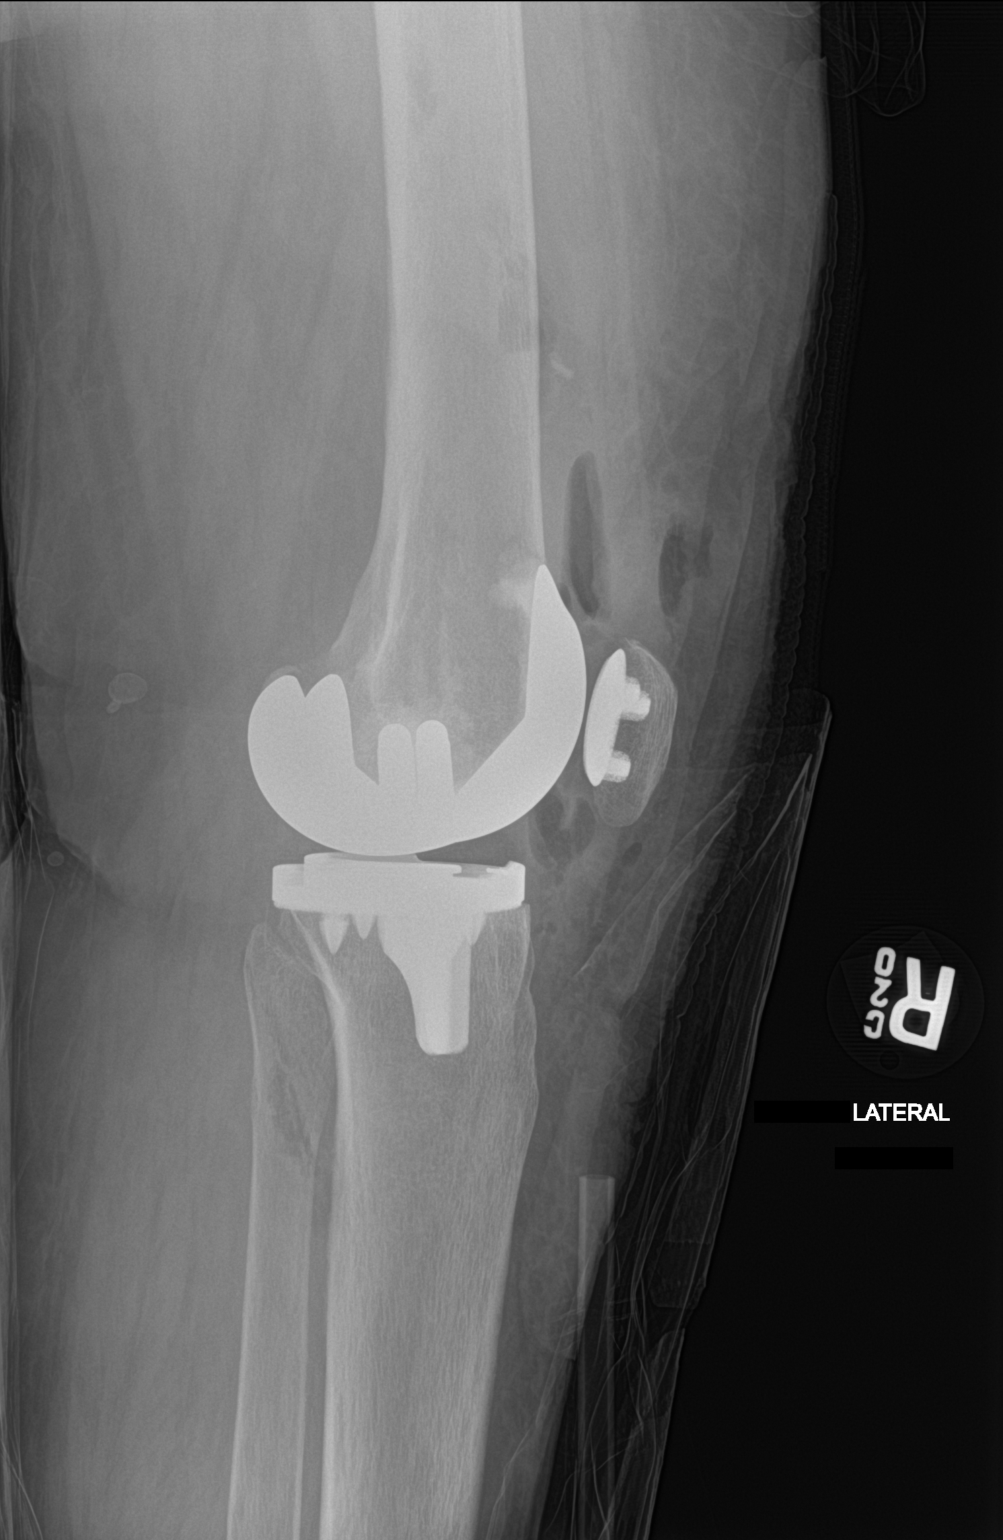

[3 of 3 positions shown; findings below may reference images not displayed]

FINDINGS: The femoral and tibial components appear to be well situated.
Expected postoperative changes are noted anteriorly.
IMPRESSION: Status post right total knee arthroplasty.

## 2020-09-12 ENCOUNTER — Other Ambulatory Visit: Payer: Self-pay | Admitting: Family Medicine

## 2020-09-12 DIAGNOSIS — N6320 Unspecified lump in the left breast, unspecified quadrant: Secondary | ICD-10-CM

## 2020-09-12 DIAGNOSIS — N6310 Unspecified lump in the right breast, unspecified quadrant: Secondary | ICD-10-CM

## 2020-11-08 ENCOUNTER — Other Ambulatory Visit: Payer: Medicare Other

## 2020-12-07 ENCOUNTER — Inpatient Hospital Stay: Admission: RE | Admit: 2020-12-07 | Payer: Medicare Other | Source: Ambulatory Visit

## 2020-12-07 ENCOUNTER — Other Ambulatory Visit: Payer: Medicare Other

## 2021-02-14 ENCOUNTER — Other Ambulatory Visit: Payer: Self-pay

## 2021-02-14 ENCOUNTER — Ambulatory Visit
Admission: RE | Admit: 2021-02-14 | Discharge: 2021-02-14 | Disposition: A | Discharge status: Home / Self Care | Payer: Medicare Other | Source: Ambulatory Visit | Attending: Family Medicine | Admitting: Family Medicine

## 2021-02-14 ENCOUNTER — Ambulatory Visit: Payer: Medicare Other

## 2021-02-14 DIAGNOSIS — N6310 Unspecified lump in the right breast, unspecified quadrant: Secondary | ICD-10-CM

## 2021-02-14 DIAGNOSIS — N6320 Unspecified lump in the left breast, unspecified quadrant: Secondary | ICD-10-CM

## 2021-03-08 ENCOUNTER — Other Ambulatory Visit: Payer: Medicare Other

## 2021-05-01 ENCOUNTER — Other Ambulatory Visit: Payer: Self-pay | Admitting: Family Medicine

## 2021-05-01 DIAGNOSIS — N6310 Unspecified lump in the right breast, unspecified quadrant: Secondary | ICD-10-CM

## 2021-05-18 ENCOUNTER — Other Ambulatory Visit: Payer: Self-pay | Admitting: Family Medicine

## 2021-05-18 DIAGNOSIS — N6320 Unspecified lump in the left breast, unspecified quadrant: Secondary | ICD-10-CM

## 2021-05-18 DIAGNOSIS — N6310 Unspecified lump in the right breast, unspecified quadrant: Secondary | ICD-10-CM

## 2021-06-28 ENCOUNTER — Other Ambulatory Visit: Payer: Medicare Other

## 2021-06-28 ENCOUNTER — Inpatient Hospital Stay: Admission: RE | Admit: 2021-06-28 | Payer: Medicare Other | Source: Ambulatory Visit

## 2022-02-17 ENCOUNTER — Emergency Department (HOSPITAL_BASED_OUTPATIENT_CLINIC_OR_DEPARTMENT_OTHER)
Admission: EM | Admit: 2022-02-17 | Discharge: 2022-02-17 | Disposition: A | Discharge status: Home / Self Care | Payer: Medicare Other | Attending: Emergency Medicine | Admitting: Emergency Medicine

## 2022-02-17 ENCOUNTER — Other Ambulatory Visit: Payer: Self-pay

## 2022-02-17 ENCOUNTER — Emergency Department (HOSPITAL_BASED_OUTPATIENT_CLINIC_OR_DEPARTMENT_OTHER): Payer: Medicare Other | Admitting: Radiology

## 2022-02-17 ENCOUNTER — Encounter (HOSPITAL_BASED_OUTPATIENT_CLINIC_OR_DEPARTMENT_OTHER): Payer: Self-pay

## 2022-02-17 DIAGNOSIS — I509 Heart failure, unspecified: Secondary | ICD-10-CM | POA: Insufficient documentation

## 2022-02-17 DIAGNOSIS — E039 Hypothyroidism, unspecified: Secondary | ICD-10-CM | POA: Insufficient documentation

## 2022-02-17 DIAGNOSIS — Z79899 Other long term (current) drug therapy: Secondary | ICD-10-CM | POA: Insufficient documentation

## 2022-02-17 DIAGNOSIS — J45909 Unspecified asthma, uncomplicated: Secondary | ICD-10-CM | POA: Insufficient documentation

## 2022-02-17 DIAGNOSIS — I110 Hypertensive heart disease with heart failure: Secondary | ICD-10-CM | POA: Insufficient documentation

## 2022-02-17 DIAGNOSIS — Z7984 Long term (current) use of oral hypoglycemic drugs: Secondary | ICD-10-CM | POA: Insufficient documentation

## 2022-02-17 DIAGNOSIS — R0602 Shortness of breath: Secondary | ICD-10-CM | POA: Diagnosis present

## 2022-02-17 DIAGNOSIS — E119 Type 2 diabetes mellitus without complications: Secondary | ICD-10-CM | POA: Diagnosis not present

## 2022-02-17 LAB — BASIC METABOLIC PANEL
Anion gap: 5 (ref 5–15)
BUN: 15 mg/dL (ref 6–20)
CO2: 29 mmol/L (ref 22–32)
Calcium: 8.7 mg/dL — ABNORMAL LOW (ref 8.9–10.3)
Chloride: 105 mmol/L (ref 98–111)
Creatinine, Ser: 1.01 mg/dL — ABNORMAL HIGH (ref 0.44–1.00)
GFR, Estimated: >60 mL/min (ref >60)
Glucose, Bld: 101 mg/dL — ABNORMAL HIGH (ref 70–99)
Potassium: 3.9 mmol/L (ref 3.5–5.1)
Sodium: 139 mmol/L (ref 135–145)

## 2022-02-17 LAB — CBC
HCT: 30.2 % — ABNORMAL LOW (ref 36.0–46.0)
Hemoglobin: 9.5 g/dL — ABNORMAL LOW (ref 12.0–15.0)
MCH: 27 pg (ref 26.0–34.0)
MCHC: 31.5 g/dL (ref 30.0–36.0)
MCV: 85.8 fL (ref 80.0–100.0)
Platelets: 182 10*3/uL (ref 150–400)
RBC: 3.52 MIL/uL — ABNORMAL LOW (ref 3.87–5.11)
RDW: 19.1 % — ABNORMAL HIGH (ref 11.5–15.5)
WBC: 3.6 10*3/uL — ABNORMAL LOW (ref 4.0–10.5)
nRBC: 0 % (ref 0.0–0.2)

## 2022-02-17 LAB — BRAIN NATRIURETIC PEPTIDE: B Natriuretic Peptide: 1202.2 pg/mL — ABNORMAL HIGH (ref 0.0–100.0)

## 2022-02-17 MED ORDER — FUROSEMIDE 20 MG PO TABS: 20.0000 mg | ORAL_TABLET | Freq: Two times a day (BID) | ORAL | 0 refills | Status: DC

## 2022-02-17 MED ORDER — FUROSEMIDE 10 MG/ML IJ SOLN
40.0000 mg | Freq: Once | INTRAMUSCULAR | Status: AC
  Administered 2022-02-17: 40 mg via INTRAVENOUS
  Filled 2022-02-17: qty 4

## 2022-02-17 NOTE — ED Provider Triage Note (Signed)
Emergency Medicine Provider Triage Evaluation Note  Paula Hall , a 61 y.o. female  was evaluated in triage.  Pt complains of shortness of breath and swelling   Review of Systems  Positive: Leg swelling Negative: fever  Physical Exam  Ht '4\' 9"'$  (1.448 m)   Wt 80.3 kg   LMP 08/17/2010   BMI 38.30 kg/m  Gen:   Awake, no distress   Resp:  Normal effort  MSK:   Moves extremities.  Large amount of edema  Other:    Medical Decision Making  Medically screening exam initiated at 7:10 PM.  Appropriate orders placed.  Chrysa A Duesing was informed that the remainder of the evaluation will be completed by another provider, this initial triage assessment does not replace that evaluation, and the importance of remaining in the ED until their evaluation is complete.     Fransico Meadow, [place] 02/17/22 1911

## 2022-02-17 NOTE — ED Provider Notes (Signed)
[place] EMERGENCY DEPT Provider Note   CSN: [id number] Arrival date & time: 02/17/22  1859     History  Chief Complaint  Patient presents with   Shortness of Breath    Graysen A Pokorney is a 61 y.o. female.  Patient is a 61 year old female with past medical history of OSA, GERD, asthma, diabetes, hypertension, and hypothyroidism presenting for complaints of shortness of breath.  Patient admits to shortness of breath and lower extremity swelling.  States that she moved from [place] several years ago and has not followed with a primary care physician and has not been taking her medications as prescribed.  Patient also endorses orthopnea.  Denies any fevers, chills, coughing.  Denies any chest pain at this time.  She had an echocardiogram in the past but it was over 10 years ago.  Does not currently follow with cardiology.  The history is provided by the patient. No language interpreter was used.  Shortness of Breath Associated symptoms: no abdominal pain, no chest pain, no cough, no ear pain, no fever, no rash, no sore throat and no vomiting        Home Medications Prior to Admission medications   Medication Sig Start Date End Date Taking? Authorizing Provider  furosemide (LASIX) 20 MG tablet Take 1 tablet (20 mg total) by mouth 2 (two) times daily for 15 days. 02/17/22 18/29/93 Yes Campbell Stall P, DO  albuterol (PROVENTIL HFA;VENTOLIN HFA) 108 (90 Base) MCG/ACT inhaler Inhale 1-2 puffs into the lungs every 6 (six) hours as needed for wheezing or shortness of breath.     [provider]  amLODipine (NORVASC) 10 MG tablet Take 10 mg by mouth daily.    [provider]  betamethasone dipropionate (DIPROLENE) 0.05 % cream Apply 1 application topically daily as needed (rash neck and arms).     [provider]  Calcium Citrate-Vitamin D (CALCIUM + D PO) Take 600 mg by mouth 2 (two) times daily.     [provider]  celecoxib (CELEBREX) 200  MG capsule Take 1 capsule (200 mg total) by mouth daily. 02/03/19   Swinteck, Aaron Edelman, MD  Cholecalciferol (VITAMIN D) 50 MCG (2000 UT) tablet Take 2,000 Units by mouth daily.    [provider]  clonazePAM (KLONOPIN) 0.5 MG tablet Take 1 tablet (0.5 mg total) by mouth daily as needed for anxiety. 06/20/19   Arfeen, Arlyce Harman, MD  docusate sodium (COLACE) 100 MG capsule Take 1 capsule (100 mg total) by mouth 2 (two) times daily. 02/03/19   Swinteck, Aaron Edelman, MD  ferrous sulfate 325 (65 FE) MG tablet Take 325 mg by mouth 2 (two) times daily.    [provider]  fluticasone (FLONASE) 50 MCG/ACT nasal spray Place 1 spray into both nostrils daily as needed for allergies or rhinitis.    [provider]  HYDROcodone-acetaminophen (NORCO) 10-325 MG tablet Take 1 tablet by mouth 3 (three) times daily as needed. 02/23/19   [provider]  hydrOXYzine (ATARAX/VISTARIL) 25 MG tablet Take 1 tablet (25 mg total) by mouth at bedtime. Patient not taking: Reported on 06/20/2019 02/08/19   Kathlee Nations, MD  Lancets (FREESTYLE) lancets 1 each by Other route as needed for other. Use as instructed    [provider]  levothyroxine (SYNTHROID, LEVOTHROID) 137 MCG tablet Take 137 mcg by mouth daily before breakfast.  05/26/18   [provider]  metFORMIN (GLUCOPHAGE) 500 MG tablet Take 500 mg by mouth 2 (two) times daily with  a meal.     [provider]  Multiple Vitamin (MULTIVITAMIN WITH MINERALS) TABS tablet Take 1 tablet by mouth daily.    [provider]  Omega-3 Fatty Acids (FISH OIL) 1000 MG CAPS Take 1,000 mg by mouth daily.    [provider]  omeprazole (PRILOSEC) 40 MG capsule Take 40 mg by mouth daily.    [provider]  ondansetron (ZOFRAN) 4 MG tablet Take 1 tablet (4 mg total) by mouth every 6 (six) hours as needed for nausea. 02/03/19   Swinteck, Aaron Edelman, MD  QUEtiapine (SEROQUEL) 25 MG tablet Take 3 tablets (75 mg total) by  mouth at bedtime. 06/20/19   Arfeen, Arlyce Harman, MD  rosuvastatin (CRESTOR) 20 MG tablet Take 20 mg by mouth every evening.    [provider]  senna (SENOKOT) 8.6 MG TABS tablet Take 1 tablet (8.6 mg total) by mouth 2 (two) times daily. Patient not taking: Reported on 02/28/2019 02/03/19   Rod Can, MD  vitamin C (ASCORBIC ACID) 500 MG tablet Take 500 mg by mouth daily.    [provider]      Allergies    Shellfish allergy, Contrast media [iodinated contrast media], and Metrizamide    Review of Systems   Review of Systems  Constitutional:  Negative for chills and fever.  HENT:  Negative for ear pain and sore throat.   Eyes:  Negative for pain and visual disturbance.  Respiratory:  Positive for shortness of breath. Negative for cough.   Cardiovascular:  Positive for leg swelling. Negative for chest pain and palpitations.  Gastrointestinal:  Negative for abdominal pain and vomiting.  Genitourinary:  Negative for dysuria and hematuria.  Musculoskeletal:  Negative for arthralgias and back pain.  Skin:  Negative for color change and rash.  Neurological:  Negative for seizures and syncope.  All other systems reviewed and are negative.   Physical Exam Updated Vital Signs BP (!) 151/95   Pulse 61   Temp 97.9 F (36.6 C)   Resp 18   Ht '4\' 9"'$  (1.448 m)   Wt 80.3 kg   LMP 08/17/2010   SpO2 95%   BMI 38.30 kg/m  Physical Exam Vitals and nursing note reviewed.  Constitutional:      General: She is not in acute distress.    Appearance: She is well-developed.  HENT:     Head: Normocephalic and atraumatic.  Eyes:     Conjunctiva/sclera: Conjunctivae normal.  Cardiovascular:     Rate and Rhythm: Normal rate and regular rhythm.     Heart sounds: No murmur heard. Pulmonary:     Effort: Pulmonary effort is normal. No respiratory distress.     Breath sounds: Normal breath sounds.  Abdominal:     Palpations: Abdomen is soft.     Tenderness: There is no abdominal  tenderness.  Musculoskeletal:        General: No swelling.     Cervical back: Neck supple.  Skin:    General: Skin is warm and dry.     Capillary Refill: Capillary refill takes less than 2 seconds.  Neurological:     Mental Status: She is alert.  Psychiatric:        Mood and Affect: Mood normal.     ED Results / Procedures / Treatments   Labs (all labs ordered are listed, but only abnormal results are displayed) Labs Reviewed  BASIC METABOLIC PANEL - Abnormal; Notable for the following components:      Result Value  Glucose, Bld 101 (*)    Creatinine, Ser 1.01 (*)    Calcium 8.7 (*)    All other components within normal limits  CBC - Abnormal; Notable for the following components:   WBC 3.6 (*)    RBC 3.52 (*)    Hemoglobin 9.5 (*)    HCT 30.2 (*)    RDW 19.1 (*)    All other components within normal limits  BRAIN NATRIURETIC PEPTIDE - Abnormal; Notable for the following components:   B Natriuretic Peptide 1,202.2 (*)    All other components within normal limits    EKG None  Radiology DG Chest 2 View  Result Date: 02/17/2022 CLINICAL DATA:  Leg swelling EXAM: CHEST - 2 VIEW COMPARISON:  None Available. FINDINGS: Cardiomegaly. Aortic atherosclerosis. Mild pulmonary vascular congestion. Streaky bibasilar opacities. No large pleural fluid collection. No pneumothorax. IMPRESSION: 1. Cardiomegaly with mild pulmonary vascular congestion. 2. Streaky bibasilar opacities may reflect atelectasis versus pneumonia. Electronically Signed   By: Davina Poke D.O.   On: 02/17/2022 19:27    Procedures Procedures    Medications Ordered in ED Medications  furosemide (LASIX) injection 40 mg (40 mg Intravenous Given 02/17/22 2257)    ED Course/ Medical Decision Making/ A&P                           Medical Decision Making Amount and/or Complexity of Data Reviewed Labs: ordered. Radiology: ordered.  Risk Prescription drug management.   44:30 PM 61 year old female  with past medical history of OSA, GERD, asthma, diabetes, hypertension, and hypothyroidism presenting for complaints of shortness of breath.  Patient is alert and oriented x3, no acute distress, afebrile, so vital signs.  Physical exam demonstrates clear breath sounds with lower extremity pitting edema x4.  Chest x-ray demonstrates cardiomegaly with pulmonary vascular congestion.  Patient appears to be in CHF exacerbation.  No hypoxia or signs of respiratory distress.  Lasix 60 mg given.  EKG demonstrates normal sinus rhythm.  BNP elevated at 1202.  No previous BMPs to compare.  Patient recommended for admission for CHF exacerbation for echocardiogram and consult to cardiology however she is declining admission at this time.  Lasix given in ED and sent to pharmacy for oral home use of 20 mg twice a day.  I spoke with overnight cardiology team who will notify the morning team to try to help establish patient with an appointment.  I placed an ambulatory referral for patient.          Final Clinical Impression(s) / ED Diagnoses Final diagnoses:  Acute on chronic congestive heart failure, unspecified heart failure type [facility])    Rx / DC Orders ED Discharge Orders          Ordered    furosemide (LASIX) 20 MG tablet  2 times daily        02/17/22 2309    Ambulatory referral to Cardiology        02/17/22 2319              Lianne Cure, DO 11/73/56 2322

## 2022-02-17 NOTE — Discharge Instructions (Addendum)
I have placed an ambulatory referral to our cardiology office on [facility].  I have also spoken with on-call cardiologist who is going to make a phone call to her colleagues in the morning to try to help establish a close follow-up appointment for you.  At this time I recommend that you get an echocardiogram which is an ultrasound of the heart to measure your pump function.  Please take Lasix 20 mg twice a day to help get some of the fluid off of your lungs and legs.

## 2022-02-17 NOTE — ED Triage Notes (Signed)
Pt presents to the ED with sister. States that they went to her PCP today with new onset of swelling. Reports leg swelling, swelling in her face, and abdomen. Pt reports this swelling and SHOB started on Friday. No hx of CHF. PCP wants her to be evaluated for renal failure/acute heart failure. PA evaluated pt in triage.

## 2022-02-19 ENCOUNTER — Ambulatory Visit: Payer: Medicare Other | Attending: Internal Medicine | Admitting: Cardiology

## 2022-02-19 ENCOUNTER — Encounter: Payer: Self-pay | Admitting: Cardiology

## 2022-02-19 VITALS — BP 120/70 | HR 69 | Ht <= 58 in | Wt 169.0 lb

## 2022-02-19 DIAGNOSIS — M7989 Other specified soft tissue disorders: Secondary | ICD-10-CM | POA: Diagnosis not present

## 2022-02-19 DIAGNOSIS — Z79899 Other long term (current) drug therapy: Secondary | ICD-10-CM | POA: Diagnosis not present

## 2022-02-19 DIAGNOSIS — I5031 Acute diastolic (congestive) heart failure: Secondary | ICD-10-CM | POA: Diagnosis not present

## 2022-02-19 MED ORDER — FUROSEMIDE 20 MG PO TABS: 20.0000 mg | ORAL_TABLET | Freq: Two times a day (BID) | ORAL | 2 refills | Status: AC

## 2022-02-19 MED ORDER — SPIRONOLACTONE 25 MG PO TABS: 12.5000 mg | ORAL_TABLET | Freq: Every day | ORAL | 3 refills | Status: AC

## 2022-02-19 MED ORDER — SACUBITRIL-VALSARTAN 24-26 MG PO TABS: 1.0000 | ORAL_TABLET | Freq: Two times a day (BID) | ORAL | 6 refills | Status: DC

## 2022-02-19 MED ORDER — EMPAGLIFLOZIN 10 MG PO TABS: 10.0000 mg | ORAL_TABLET | Freq: Every day | ORAL | 6 refills | Status: AC

## 2022-02-19 NOTE — Patient Instructions (Addendum)
Medication Instructions:  Please discontinue your Amlodipine. Start Entresto 24-26 mg one twice a day, Jardiance 10 mg daily and Spironolactone 12.5 mg daily. Continue all other medications as listed.  *If you need a refill on your cardiac medications before your next appointment, please call your pharmacy*   Lab Work: Please have blood work in 1 week  (BMP) If you have labs (blood work) drawn today and your tests are completely normal, you will receive your results only by: [place] (if you have MyChart) OR A paper copy in the mail If you have any lab test that is abnormal or we need to change your treatment, we will call you to review the results.   Your physician has requested that you have an echocardiogram. Echocardiography is a painless test that uses sound waves to create images of your heart. It provides your doctor with information about the size and shape of your heart and how well your heart's chambers and valves are working. This procedure takes approximately one hour. There are no restrictions for this procedure. Please do NOT wear cologne, perfume, aftershave, or lotions (deodorant is allowed). Please arrive 15 minutes prior to your appointment time.   Follow-Up: At [facility], you and your health needs are our priority.  As part of our continuing mission to provide you with exceptional heart care, we have created designated Provider Care Teams.  These Care Teams include your primary Cardiologist (physician) and Advanced Practice Providers (APPs -  Physician Assistants and Nurse Practitioners) who all work together to provide you with the care you need, when you need it.  We recommend signing up for the patient portal called "MyChart".  Sign up information is provided on this After Visit Summary.  MyChart is used to connect with patients for Virtual Visits (Telemedicine).  Patients are able to view lab/test results, encounter notes, upcoming appointments, etc.   Non-urgent messages can be sent to your provider as well.   To learn more about what you can do with MyChart, go to [email or web address].    Your next appointment:   1 week  The format for your next appointment:   In Person  Provider:   Dr Candee Furbish     Important Information About Sugar

## 2022-02-19 NOTE — Progress Notes (Signed)
Cardiology Office Note:    Date:  02/19/2022   ID:  Paula Hall, DOB 09/01/1960, MRN [id number]  PCP:  Glendon Axe, MD   [facility] Providers Cardiologist:  None     Referring MD: Glendon Axe, MD    History of Present Illness:    Paula Hall is a 61 y.o. female here for the evaluation of heart failure at the request of Dr. Glendon Axe.  She was in the emergency department on 02/17/2022 with shortness of breath lower extremity swelling with orthopnea  In October 2023 BNP was 1200.  Hemoglobin 9.5.  Chest x-ray showed cardiomegaly with pulmonary vascular congestion personally reviewed and interpreted.  Started on furosemide 20 twice daily for 15 days for lower extremity edema.  States that it has been working.  Used to have "skinny legs ".  Her legs are tight now full of fluid.  Tobacco use encourage cessation.  Past Medical History:  Diagnosis Date   Abdominal hernia    Anemia    Arthritis    Asthma    Complication of anesthesia    woke up during procedure   Depression    Diabetes ([place])    GERD (gastroesophageal reflux disease)    Heart murmur    Hypertension    Hypothyroid    Sleep apnea    does not wear CPAP   Stroke ([place])    " mild stroke years ago"   Vocal cord polyps    B/L    Past Surgical History:  Procedure Laterality Date   BREAST BIOPSY     BREAST SURGERY      left breast biopsy   CYST EXCISION  2017   abdomen   FOOT SURGERY     GASTRIC BYPASS  2006   HERNIA REPAIR  2015   IRRIGATION AND DEBRIDEMENT KNEE Right 03/02/2019   Procedure: IRRIGATION AND DEBRIDEMENT KNEE;  Surgeon: Rod Can, MD;  Location: [facility];  Service: Orthopedics;  Laterality: Right;   KNEE ARTHROPLASTY Right 02/02/2019   Procedure: COMPUTER ASSISTED TOTAL KNEE ARTHROPLASTY;  Surgeon: Rod Can, MD;  Location: [facility];  Service: Orthopedics;  Laterality: Right;   MICROLARYNGOSCOPY Right 04/08/2017   Procedure: MICROLARYNGOSCOPY, BILATERAL VOCAL CORD POLYP  REMOVAL;  Surgeon: [name], MD;  Location: [place];  Service: ENT;  Laterality: Right;    Current Medications: Current Meds  Medication Sig   albuterol (PROVENTIL HFA;VENTOLIN HFA) 108 (90 Base) MCG/ACT inhaler Inhale 1-2 puffs into the lungs every 6 (six) hours as needed for wheezing or shortness of breath.    betamethasone dipropionate (DIPROLENE) 0.05 % cream Apply 1 application topically daily as needed (rash neck and arms).    Calcium Citrate-Vitamin D (CALCIUM + D PO) Take 600 mg by mouth 2 (two) times daily.    celecoxib (CELEBREX) 200 MG capsule Take 1 capsule (200 mg total) by mouth daily.   Cholecalciferol (VITAMIN D) 50 MCG (2000 UT) tablet Take 2,000 Units by mouth daily.   clonazePAM (KLONOPIN) 0.5 MG tablet Take 1 tablet (0.5 mg total) by mouth daily as needed for anxiety.   docusate sodium (COLACE) 100 MG capsule Take 1 capsule (100 mg total) by mouth 2 (two) times daily.   empagliflozin (JARDIANCE) 10 MG TABS tablet Take 1 tablet (10 mg total) by mouth daily before breakfast.   ferrous sulfate 325 (65 FE) MG tablet Take 325 mg by mouth 2 (two) times daily.   fluticasone (FLONASE) 50 MCG/ACT nasal spray Place 1 spray into both  nostrils daily as needed for allergies or rhinitis.   HYDROcodone-acetaminophen (NORCO) 10-325 MG tablet Take 1 tablet by mouth 3 (three) times daily as needed.   hydrOXYzine (ATARAX/VISTARIL) 25 MG tablet Take 1 tablet (25 mg total) by mouth at bedtime.   Lancets (FREESTYLE) lancets 1 each by Other route as needed for other. Use as instructed   levothyroxine (SYNTHROID, LEVOTHROID) 137 MCG tablet Take 137 mcg by mouth daily before breakfast.    metFORMIN (GLUCOPHAGE) 500 MG tablet Take 500 mg by mouth 2 (two) times daily with a meal.    Multiple Vitamin (MULTIVITAMIN WITH MINERALS) TABS tablet Take 1 tablet by mouth daily.   Omega-3 Fatty Acids (FISH OIL) 1000 MG CAPS Take 1,000 mg by mouth daily.   omeprazole (PRILOSEC) 40 MG capsule Take 40 mg by  mouth daily.   ondansetron (ZOFRAN) 4 MG tablet Take 1 tablet (4 mg total) by mouth every 6 (six) hours as needed for nausea.   QUEtiapine (SEROQUEL) 25 MG tablet Take 3 tablets (75 mg total) by mouth at bedtime.   rosuvastatin (CRESTOR) 20 MG tablet Take 20 mg by mouth every evening.   sacubitril-valsartan (ENTRESTO) 24-26 MG Take 1 tablet by mouth 2 (two) times daily.   senna (SENOKOT) 8.6 MG TABS tablet Take 1 tablet (8.6 mg total) by mouth 2 (two) times daily.   spironolactone (ALDACTONE) 25 MG tablet Take 0.5 tablets (12.5 mg total) by mouth daily.   vitamin C (ASCORBIC ACID) 500 MG tablet Take 500 mg by mouth daily.   [DISCONTINUED] amLODipine (NORVASC) 10 MG tablet Take 10 mg by mouth daily.   [DISCONTINUED] furosemide (LASIX) 20 MG tablet Take 1 tablet (20 mg total) by mouth 2 (two) times daily for 15 days.   Current Facility-Administered Medications for the 02/19/22 encounter (Office Visit) with Jerline Pain, MD  Medication   0.9 %  sodium chloride infusion     Allergies:   Shellfish allergy, Contrast media [iodinated contrast media], and Metrizamide   Social History   Socioeconomic History   Marital status: Single    Spouse name: Not on file   Number of children: Not on file   Years of education: Not on file   Highest education level: Not on file  Occupational History   Occupation: retired  Tobacco Use   Smoking status: Every Day    Types: Cigarettes   Smokeless tobacco: Never  Vaping Use   Vaping Use: Never used  Substance and Sexual Activity   Alcohol use: Not Currently   Drug use: No   Sexual activity: Never    Birth control/protection: None  Other Topics Concern   Not on file  Social History Narrative   Not on file   Social Determinants of Health   Financial Resource Strain: Not on file  Food Insecurity: Not on file  Transportation Needs: Not on file  Physical Activity: Not on file  Stress: Not on file  Social Connections: Not on file     Family  History: The patient's family history includes Kidney cancer in her mother; Other in her father. There is no history of Colon cancer or Stomach cancer.  ROS:   Please see the history of present illness.    Leg pain, shortness of breath, lower extremity edema all other systems reviewed and are negative.  EKGs/Labs/Other Studies Reviewed:    The following studies were reviewed today: ER notes, lab work.  BNP 1200 hemoglobin 9.5 creatinine 1.01 potassium 3.9  EKG: 02/17/2022-sinus rhythm first-degree  AV block nonspecific ST-T wave changes baseline artifact noted  Recent Labs: 02/17/2022: B Natriuretic Peptide 1,202.2; BUN 15; Creatinine, Ser 1.01; Hemoglobin 9.5; Platelets 182; Potassium 3.9; Sodium 139  Recent Lipid Panel No results found for: "CHOL", "TRIG", "HDL", "CHOLHDL", "VLDL", "LDLCALC", "LDLDIRECT"   Risk Assessment/Calculations:              Physical Exam:    VS:  BP 120/70 (BP Location: Left Arm, Patient Position: Sitting, Cuff Size: Normal)   Pulse 69   Ht '4\' 9"'$  (1.448 m)   Wt 169 lb (76.7 kg)   LMP 08/17/2010   SpO2 94%   BMI 36.57 kg/m     Wt Readings from Last 3 Encounters:  02/19/22 169 lb (76.7 kg)  02/17/22 177 lb (80.3 kg)  03/02/19 163 lb 6.4 oz (74.1 kg)     GEN:  Well nourished, well developed in no acute distress, sitting in wheelchair HEENT: Normal NECK: No JVD; No carotid bruits LYMPHATICS: No lymphadenopathy CARDIAC: RRR, no murmurs, no rubs, gallops RESPIRATORY: Mild crackles heard/wheezes bilaterally ABDOMEN: Soft, non-tender, non-distended MUSCULOSKELETAL: 3+ bilateral lower extremity edema; No deformity  SKIN: Warm and dry NEUROLOGIC:  Alert and oriented x 3 PSYCHIATRIC:  Normal affect   ASSESSMENT:    1. Acute diastolic heart failure (HCC)   2. Leg swelling   3. Medication management    PLAN:    In order of problems listed above:  Acute diastolic heart failure - Check echocardiogram.  If EF is low, systolic heart  failure. - Start Entresto 24/26 twice daily - Start spironolactone 12.5 mg once a day - Start Jardiance 10 mg once a day. - Stop amlodipine 10 mg a day-contributing to her lower extremity edema. - Continue with Lasix 20 mg twice a day - Check basic metabolic profile in 1 week and see her back in 1 week. - Discussed hospital precautions.       Medication Adjustments/Labs and Tests Ordered: Current medicines are reviewed at length with the patient today.  Concerns regarding medicines are outlined above.  Orders Placed This Encounter  Procedures   Basic metabolic panel   ECHOCARDIOGRAM COMPLETE   Meds ordered this encounter  Medications   furosemide (LASIX) 20 MG tablet    Sig: Take 1 tablet (20 mg total) by mouth 2 (two) times daily.    Dispense:  60 tablet    Refill:  2   sacubitril-valsartan (ENTRESTO) 24-26 MG    Sig: Take 1 tablet by mouth 2 (two) times daily.    Dispense:  60 tablet    Refill:  6   spironolactone (ALDACTONE) 25 MG tablet    Sig: Take 0.5 tablets (12.5 mg total) by mouth daily.    Dispense:  45 tablet    Refill:  3   empagliflozin (JARDIANCE) 10 MG TABS tablet    Sig: Take 1 tablet (10 mg total) by mouth daily before breakfast.    Dispense:  30 tablet    Refill:  6    Patient Instructions  Medication Instructions:  Please discontinue your Amlodipine. Start Entresto 24-26 mg one twice a day, Jardiance 10 mg daily and Spironolactone 12.5 mg daily. Continue all other medications as listed.  *If you need a refill on your cardiac medications before your next appointment, please call your pharmacy*   Lab Work: Please have blood work in 1 week  (BMP) If you have labs (blood work) drawn today and your tests are completely normal, you will receive your results only  by: MyChart Message (if you have MyChart) OR A paper copy in the mail If you have any lab test that is abnormal or we need to change your treatment, we will call you to review the  results.   Your physician has requested that you have an echocardiogram. Echocardiography is a painless test that uses sound waves to create images of your heart. It provides your doctor with information about the size and shape of your heart and how well your heart's chambers and valves are working. This procedure takes approximately one hour. There are no restrictions for this procedure. Please do NOT wear cologne, perfume, aftershave, or lotions (deodorant is allowed). Please arrive 15 minutes prior to your appointment time.   Follow-Up: At [facility], you and your health needs are our priority.  As part of our continuing mission to provide you with exceptional heart care, we have created designated Provider Care Teams.  These Care Teams include your primary Cardiologist (physician) and Advanced Practice Providers (APPs -  Physician Assistants and Nurse Practitioners) who all work together to provide you with the care you need, when you need it.  We recommend signing up for the patient portal called "MyChart".  Sign up information is provided on this After Visit Summary.  MyChart is used to connect with patients for Virtual Visits (Telemedicine).  Patients are able to view lab/test results, encounter notes, upcoming appointments, etc.  Non-urgent messages can be sent to your provider as well.   To learn more about what you can do with MyChart, go to [email or web address].    Your next appointment:   1 week  The format for your next appointment:   In Person  Provider:   Dr Candee Furbish     Important Information About Sugar         Signed, Candee Furbish, MD  02/19/2022 3:39 PM    [place]

## 2022-02-21 ENCOUNTER — Ambulatory Visit (HOSPITAL_COMMUNITY): Payer: Medicare Other | Attending: Cardiology

## 2022-02-21 DIAGNOSIS — I5031 Acute diastolic (congestive) heart failure: Secondary | ICD-10-CM | POA: Diagnosis present

## 2022-02-21 LAB — ECHOCARDIOGRAM COMPLETE
Area-P 1/2: 2.97 cm2
P 1/2 time: 669 msec
S' Lateral: 3.2 cm

## 2022-02-28 ENCOUNTER — Ambulatory Visit: Payer: Medicare Other | Attending: Cardiology | Admitting: Cardiology

## 2022-02-28 ENCOUNTER — Encounter: Payer: Self-pay | Admitting: Cardiology

## 2022-02-28 VITALS — BP 130/70 | HR 66 | Ht <= 58 in | Wt 162.0 lb

## 2022-02-28 DIAGNOSIS — I5031 Acute diastolic (congestive) heart failure: Secondary | ICD-10-CM

## 2022-02-28 DIAGNOSIS — Z79899 Other long term (current) drug therapy: Secondary | ICD-10-CM

## 2022-02-28 LAB — BASIC METABOLIC PANEL
BUN/Creatinine Ratio: 13 (ref 12–28)
BUN: 15 mg/dL (ref 8–27)
CO2: 25 mmol/L (ref 20–29)
Calcium: 9 mg/dL (ref 8.7–10.3)
Chloride: 101 mmol/L (ref 96–106)
Creatinine, Ser: 1.2 mg/dL — ABNORMAL HIGH (ref 0.57–1.00)
Glucose: 102 mg/dL — ABNORMAL HIGH (ref 70–99)
Potassium: 4.6 mmol/L (ref 3.5–5.2)
Sodium: 139 mmol/L (ref 134–144)
eGFR: 52 mL/min/{1.73_m2} — ABNORMAL LOW (ref >59)

## 2022-02-28 NOTE — Patient Instructions (Signed)
Medication Instructions:  The current medical regimen is effective;  continue present plan and medications.  *If you need a refill on your cardiac medications before your next appointment, please call your pharmacy*   Lab Work: Please have blood work today (BMP)  If you have labs (blood work) drawn today and your tests are completely normal, you will receive your results only by: MyChart Message (if you have MyChart) OR A paper copy in the mail If you have any lab test that is abnormal or we need to change your treatment, we will call you to review the results.  Follow-Up: At [facility], you and your health needs are our priority.  As part of our continuing mission to provide you with exceptional heart care, we have created designated Provider Care Teams.  These Care Teams include your primary Cardiologist (physician) and Advanced Practice Providers (APPs -  Physician Assistants and Nurse Practitioners) who all work together to provide you with the care you need, when you need it.  We recommend signing up for the patient portal called "MyChart".  Sign up information is provided on this After Visit Summary.  MyChart is used to connect with patients for Virtual Visits (Telemedicine).  Patients are able to view lab/test results, encounter notes, upcoming appointments, etc.  Non-urgent messages can be sent to your provider as well.   To learn more about what you can do with MyChart, go to [email or web address].    Your next appointment:   1 month(s)  The format for your next appointment:   In Person  Provider:   Dr Candee Furbish      Important Information About Sugar

## 2022-02-28 NOTE — Progress Notes (Signed)
Cardiology Office Note:    Date:  02/28/2022   ID:  Paula Hall, DOB [birth year], MRN [id number]  PCP:  Patient, No Pcp Per   [facility] Providers Cardiologist:  Candee Furbish, MD     Referring MD: Glendon Axe, MD    History of Present Illness:    Paula Hall is a 61 y.o. female here for the evaluation of heart failure at the request of Dr. Glendon Axe.  She was in the emergency department on 02/17/2022 with shortness of breath lower extremity swelling with orthopnea  In October 2023 BNP was 1200.  Hemoglobin 9.5.  Chest x-ray showed cardiomegaly with pulmonary vascular congestion personally reviewed and interpreted.  Started on furosemide 20 twice daily for 15 days for lower extremity edema.  Stated that it had been working.  Used to have "skinny legs." Her legs are tight now full of fluid.  Tobacco use, encouraged cessation.  Today, she says she is feeling a little bit better.   She states that her feet are still painful and puffy from fluid.   Her weight has gone from 177 lbs to 162 lbs recently.   She denies any palpitations, chest pain, or shortness of breath. No lightheadedness, headaches, syncope, orthopnea, or PND.  Past Medical History:  Diagnosis Date   Abdominal hernia    Anemia    Arthritis    Asthma    Complication of anesthesia    woke up during procedure   Depression    Diabetes ([place])    GERD (gastroesophageal reflux disease)    Heart murmur    Hypertension    Hypothyroid    Sleep apnea    does not wear CPAP   Stroke ([place])    " mild stroke years ago"   Vocal cord polyps    B/L    Past Surgical History:  Procedure Laterality Date   BREAST BIOPSY     BREAST SURGERY      left breast biopsy   CYST EXCISION  2017   abdomen   FOOT SURGERY     GASTRIC BYPASS  2006   HERNIA REPAIR  2015   IRRIGATION AND DEBRIDEMENT KNEE Right 03/02/2019   Procedure: IRRIGATION AND DEBRIDEMENT KNEE;  Surgeon: Rod Can, MD;  Location: [facility];   Service: Orthopedics;  Laterality: Right;   KNEE ARTHROPLASTY Right 02/02/2019   Procedure: COMPUTER ASSISTED TOTAL KNEE ARTHROPLASTY;  Surgeon: Rod Can, MD;  Location: [facility];  Service: Orthopedics;  Laterality: Right;   MICROLARYNGOSCOPY Right 04/08/2017   Procedure: MICROLARYNGOSCOPY, BILATERAL VOCAL CORD POLYP REMOVAL;  Surgeon: [name], MD;  Location: [facility];  Service: ENT;  Laterality: Right;    Current Medications: Current Meds  Medication Sig   albuterol (PROVENTIL HFA;VENTOLIN HFA) 108 (90 Base) MCG/ACT inhaler Inhale 1-2 puffs into the lungs every 6 (six) hours as needed for wheezing or shortness of breath.    betamethasone dipropionate (DIPROLENE) 0.05 % cream Apply 1 application topically daily as needed (rash neck and arms).    Calcium Citrate-Vitamin D (CALCIUM + D PO) Take 600 mg by mouth 2 (two) times daily.    celecoxib (CELEBREX) 200 MG capsule Take 1 capsule (200 mg total) by mouth daily.   Cholecalciferol (VITAMIN D) 50 MCG (2000 UT) tablet Take 2,000 Units by mouth daily.   clonazePAM (KLONOPIN) 0.5 MG tablet Take 1 tablet (0.5 mg total) by mouth daily as needed for anxiety.   docusate sodium (COLACE) 100 MG capsule Take 1 capsule (100  mg total) by mouth 2 (two) times daily.   empagliflozin (JARDIANCE) 10 MG TABS tablet Take 1 tablet (10 mg total) by mouth daily before breakfast.   ferrous sulfate 325 (65 FE) MG tablet Take 325 mg by mouth 2 (two) times daily.   fluticasone (FLONASE) 50 MCG/ACT nasal spray Place 1 spray into both nostrils daily as needed for allergies or rhinitis.   furosemide (LASIX) 20 MG tablet Take 1 tablet (20 mg total) by mouth 2 (two) times daily.   HYDROcodone-acetaminophen (NORCO) 10-325 MG tablet Take 1 tablet by mouth 3 (three) times daily as needed.   hydrOXYzine (ATARAX/VISTARIL) 25 MG tablet Take 1 tablet (25 mg total) by mouth at bedtime.   Lancets (FREESTYLE) lancets 1 each by Other route as needed for other. Use as instructed    levothyroxine (SYNTHROID, LEVOTHROID) 137 MCG tablet Take 137 mcg by mouth daily before breakfast.    metFORMIN (GLUCOPHAGE) 500 MG tablet Take 500 mg by mouth 2 (two) times daily with a meal.    Multiple Vitamin (MULTIVITAMIN WITH MINERALS) TABS tablet Take 1 tablet by mouth daily.   Omega-3 Fatty Acids (FISH OIL) 1000 MG CAPS Take 1,000 mg by mouth daily.   omeprazole (PRILOSEC) 40 MG capsule Take 40 mg by mouth daily.   ondansetron (ZOFRAN) 4 MG tablet Take 1 tablet (4 mg total) by mouth every 6 (six) hours as needed for nausea.   QUEtiapine (SEROQUEL) 25 MG tablet Take 3 tablets (75 mg total) by mouth at bedtime.   rosuvastatin (CRESTOR) 20 MG tablet Take 20 mg by mouth every evening.   sacubitril-valsartan (ENTRESTO) 24-26 MG Take 1 tablet by mouth 2 (two) times daily.   senna (SENOKOT) 8.6 MG TABS tablet Take 1 tablet (8.6 mg total) by mouth 2 (two) times daily.   spironolactone (ALDACTONE) 25 MG tablet Take 0.5 tablets (12.5 mg total) by mouth daily.   vitamin C (ASCORBIC ACID) 500 MG tablet Take 500 mg by mouth daily.   Current Facility-Administered Medications for the 02/28/22 encounter (Office Visit) with Jerline Pain, MD  Medication   0.9 %  sodium chloride infusion     Allergies:   Shellfish allergy, Contrast media [iodinated contrast media], and Metrizamide   Social History   Socioeconomic History   Marital status: Single    Spouse name: Not on file   Number of children: Not on file   Years of education: Not on file   Highest education level: Not on file  Occupational History   Occupation: retired  Tobacco Use   Smoking status: Every Day    Types: Cigarettes   Smokeless tobacco: Never  Vaping Use   Vaping Use: Never used  Substance and Sexual Activity   Alcohol use: Not Currently   Drug use: No   Sexual activity: Never    Birth control/protection: None  Other Topics Concern   Not on file  Social History Narrative   Not on file   Social Determinants of  Health   Financial Resource Strain: Not on file  Food Insecurity: Not on file  Transportation Needs: Not on file  Physical Activity: Not on file  Stress: Not on file  Social Connections: Not on file     Family History: The patient's family history includes Kidney cancer in her mother; Other in her father. There is no history of Colon cancer or Stomach cancer.  ROS:   Please see the history of present illness.    (+) Bilateral feet edema  All  other systems reviewed and are negative.  EKGs/Labs/Other Studies Reviewed:    The following studies were reviewed today: ER notes, lab work.  BNP 1200 hemoglobin 9.5 creatinine 1.01 potassium 3.9  Echo 02/21/2022: 1. Left ventricular ejection fraction, by estimation, is 45 to 50%. The  left ventricle has mildly decreased function. The left ventricle  demonstrates global hypokinesis. There is mild concentric left ventricular  hypertrophy. Left ventricular diastolic  parameters are consistent with Grade II diastolic dysfunction  (pseudonormalization). Elevated left atrial pressure. The average left  ventricular global longitudinal strain is -16.0 %. The global longitudinal  strain is abnormal.   2. Right ventricular systolic function is normal. The right ventricular  size is normal. Mildly increased right ventricular wall thickness.   3. The mitral valve is normal in structure. Trivial mitral valve  regurgitation. No evidence of mitral stenosis.   4. The aortic valve is tricuspid. Aortic valve regurgitation is severe.  Aortic valve sclerosis/calcification is present, without any evidence of  aortic stenosis.   5. The inferior vena cava is normal in size with greater than 50%  respiratory variability, suggesting right atrial pressure of 3 mmHg.   Comparison(s): No prior Echocardiogram.    EKG: EKG is personally reviewed. 02/28/22: EKG was not ordered. 02/17/2022: sinus rhythm first-degree AV block nonspecific ST-T wave changes baseline  artifact noted  Recent Labs: 02/17/2022: B Natriuretic Peptide 1,202.2; BUN 15; Creatinine, Ser 1.01; Hemoglobin 9.5; Platelets 182; Potassium 3.9; Sodium 139  Recent Lipid Panel No results found for: "CHOL", "TRIG", "HDL", "CHOLHDL", "VLDL", "LDLCALC", "LDLDIRECT"   Risk Assessment/Calculations:             Physical Exam:    VS:  BP 130/70 (BP Location: Left Arm, Patient Position: Sitting, Cuff Size: Normal)   Pulse 66   Ht '4\' 9"'$  (1.448 m)   Wt 162 lb (73.5 kg)   LMP 08/17/2010   SpO2 95%   BMI 35.06 kg/m     Wt Readings from Last 3 Encounters:  02/28/22 162 lb (73.5 kg)  02/19/22 169 lb (76.7 kg)  02/17/22 177 lb (80.3 kg)     GEN:  Well nourished, well developed in no acute distress, sitting in wheelchair HEENT: Normal NECK: No JVD; No carotid bruits LYMPHATICS: No lymphadenopathy CARDIAC: RRR, no murmurs, no rubs, gallops RESPIRATORY: Improved crackles  ABDOMEN: Soft, non-tender, non-distended MUSCULOSKELETAL: 2+ bilateral lower extremity edema; No deformity. Mostly pedal. Right foot feels better SKIN: Warm and dry NEUROLOGIC:  Alert and oriented x 3 PSYCHIATRIC:  Normal affect   ASSESSMENT:    1. Acute diastolic heart failure (HCC)   2. Leg swelling   3. Medication management    PLAN:    In order of problems listed above:  Acute systolic heart failure  - Echo EF 40 to 45%..   - 1 week of Entresto 24/26 twice daily, feeling better.  Blood pressure can tolerate increase.  Waiting for basic metabolic profile. - Spironolactone 12.5 mg once a day - Start Jardiance 10 mg once a day. - Stopped amlodipine 10 mg a day-contributing to her lower extremity edema. - Continue with Lasix 20 mg twice a day - Discussed hospital precautions.   Follow up: 1 month.   Medication Adjustments/Labs and Tests Ordered: Current medicines are reviewed at length with the patient today.  Concerns regarding medicines are outlined above.  Orders Placed This Encounter   Procedures   Basic metabolic panel   No orders of the defined types were placed in this encounter.  Patient Instructions  Medication Instructions:  The current medical regimen is effective;  continue present plan and medications.  *If you need a refill on your cardiac medications before your next appointment, please call your pharmacy*   Lab Work: Please have blood work today (BMP)  If you have labs (blood work) drawn today and your tests are completely normal, you will receive your results only by: MyChart Message (if you have MyChart) OR A paper copy in the mail If you have any lab test that is abnormal or we need to change your treatment, we will call you to review the results.  Follow-Up: At [facility], you and your health needs are our priority.  As part of our continuing mission to provide you with exceptional heart care, we have created designated Provider Care Teams.  These Care Teams include your primary Cardiologist (physician) and Advanced Practice Providers (APPs -  Physician Assistants and Nurse Practitioners) who all work together to provide you with the care you need, when you need it.  We recommend signing up for the patient portal called "MyChart".  Sign up information is provided on this After Visit Summary.  MyChart is used to connect with patients for Virtual Visits (Telemedicine).  Patients are able to view lab/test results, encounter notes, upcoming appointments, etc.  Non-urgent messages can be sent to your provider as well.   To learn more about what you can do with MyChart, go to [email or web address].    Your next appointment:   1 month(s)  The format for your next appointment:   In Person  Provider:   Dr Candee Furbish      Important Information About Sugar         I,Breanna Adamick,acting as a scribe for Candee Furbish, MD.,have documented all relevant documentation on the behalf of Candee Furbish, MD,as directed by  Candee Furbish, MD  while in the presence of Candee Furbish, MD.  I, Candee Furbish, MD, have reviewed all documentation for this visit. The documentation on 02/28/22 for the exam, diagnosis, procedures, and orders are all accurate and complete.    Signed, Candee Furbish, MD  02/28/2022 10:11 AM    [facility]

## 2022-03-06 ENCOUNTER — Ambulatory Visit: Payer: Medicare Other | Admitting: Internal Medicine

## 2022-03-20 ENCOUNTER — Encounter: Payer: Self-pay | Admitting: *Deleted

## 2022-03-24 ENCOUNTER — Other Ambulatory Visit: Payer: Self-pay | Admitting: Family Medicine

## 2022-03-24 DIAGNOSIS — Z720 Tobacco use: Secondary | ICD-10-CM

## 2022-03-31 ENCOUNTER — Ambulatory Visit: Payer: Medicare Other | Admitting: Cardiology

## 2022-04-11 ENCOUNTER — Other Ambulatory Visit (HOSPITAL_COMMUNITY)
Admission: RE | Admit: 2022-04-11 | Discharge: 2022-04-11 | Disposition: A | Discharge status: Home / Self Care | Payer: Medicare Other | Source: Ambulatory Visit | Attending: Internal Medicine | Admitting: Internal Medicine

## 2022-04-11 ENCOUNTER — Telehealth: Payer: Self-pay

## 2022-04-11 ENCOUNTER — Encounter: Payer: Self-pay | Admitting: Internal Medicine

## 2022-04-11 ENCOUNTER — Other Ambulatory Visit: Payer: Self-pay

## 2022-04-11 ENCOUNTER — Ambulatory Visit (INDEPENDENT_AMBULATORY_CARE_PROVIDER_SITE_OTHER): Payer: Medicare Other | Admitting: Internal Medicine

## 2022-04-11 VITALS — Wt 151.0 lb

## 2022-04-11 DIAGNOSIS — A539 Syphilis, unspecified: Secondary | ICD-10-CM | POA: Insufficient documentation

## 2022-04-11 MED ORDER — PENICILLIN G BENZATHINE 1200000 UNIT/2ML IM SUSY
2.4000 10*6.[IU] | PREFILLED_SYRINGE | Freq: Once | INTRAMUSCULAR | Status: AC
  Administered 2022-04-11: 2.4 10*6.[IU] via INTRAMUSCULAR

## 2022-04-11 NOTE — Progress Notes (Signed)
Pa[facility] for Infectious Disease  Reason for Consult: Syphilis  Referring Provider: Dr Dorian Pod   HPI:    Paula Hall is a 61 y.o. female with PMHx as below who presents to the clinic for syphilis.   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  Her confirmatory FTA-ABS was also positive.  She denies any history of syphilis previously and has had no treatment for this in the past that she is aware of.  She does report several years ago while living in [place] that she received a shot for "something" but is not sure if this was for syphilis or something completely unrelated.  She reiterates that she is not aware of a prior syphilis diagnosis.  She was referred to our clinic for further evaluation.  She has no other concerning complaints today.  Patient's Medications  New Prescriptions   No medications on file  Previous Medications   ALBUTEROL (PROVENTIL HFA;VENTOLIN HFA) 108 (90 BASE) MCG/ACT INHALER    Inhale 1-2 puffs into the lungs every 6 (six) hours as needed for wheezing or shortness of breath.    BETAMETHASONE DIPROPIONATE (DIPROLENE) 0.05 % CREAM    Apply 1 application topically daily as needed (rash neck and arms).    CALCIUM CITRATE-VITAMIN D (CALCIUM + D PO)    Take 600 mg by mouth 2 (two) times daily.    CELECOXIB (CELEBREX) 200 MG CAPSULE    Take 1 capsule (200 mg total) by mouth daily.   CHOLECALCIFEROL (VITAMIN D) 50 MCG (2000 UT) TABLET    Take 2,000 Units by mouth daily.   CLONAZEPAM (KLONOPIN) 0.5 MG TABLET    Take 1 tablet (0.5 mg total) by mouth daily as needed for anxiety.   DOCUSATE SODIUM (COLACE) 100 MG CAPSULE    Take 1 capsule (100 mg total) by mouth 2 (two) times daily.   EMPAGLIFLOZIN (JARDIANCE) 10 MG TABS TABLET    Take 1 tablet (10 mg total) by mouth daily before breakfast.   FERROUS SULFATE 325 (65 FE) MG TABLET    Take 325 mg by mouth 2 (two) times daily.   FLUTICASONE (FLONASE) 50 MCG/ACT NASAL SPRAY    Place 1  spray into both nostrils daily as needed for allergies or rhinitis.   FUROSEMIDE (LASIX) 20 MG TABLET    Take 1 tablet (20 mg total) by mouth 2 (two) times daily.   HYDROCODONE-ACETAMINOPHEN (NORCO) 10-325 MG TABLET    Take 1 tablet by mouth 3 (three) times daily as needed.   HYDROXYZINE (ATARAX/VISTARIL) 25 MG TABLET    Take 1 tablet (25 mg total) by mouth at bedtime.   LANCETS (FREESTYLE) LANCETS    1 each by Other route as needed for other. Use as instructed   LEVOTHYROXINE (SYNTHROID, LEVOTHROID) 137 MCG TABLET    Take 137 mcg by mouth daily before breakfast.    METFORMIN (GLUCOPHAGE) 500 MG TABLET    Take 500 mg by mouth 2 (two) times daily with a meal.    MULTIPLE VITAMIN (MULTIVITAMIN WITH MINERALS) TABS TABLET    Take 1 tablet by mouth daily.   OMEGA-3 FATTY ACIDS (FISH OIL) 1000 MG CAPS    Take 1,000 mg by mouth daily.   OMEPRAZOLE (PRILOSEC) 40 MG CAPSULE    Take 40 mg by mouth daily.   ONDANSETRON (ZOFRAN) 4 MG TABLET    Take 1 tablet (4 mg total) by mouth every 6 (six) hours as needed  for nausea.   QUETIAPINE (SEROQUEL) 25 MG TABLET    Take 3 tablets (75 mg total) by mouth at bedtime.   ROSUVASTATIN (CRESTOR) 20 MG TABLET    Take 20 mg by mouth every evening.   SACUBITRIL-VALSARTAN (ENTRESTO) 24-26 MG    Take 1 tablet by mouth 2 (two) times daily.   SENNA (SENOKOT) 8.6 MG TABS TABLET    Take 1 tablet (8.6 mg total) by mouth 2 (two) times daily.   SPIRONOLACTONE (ALDACTONE) 25 MG TABLET    Take 0.5 tablets (12.5 mg total) by mouth daily.   VITAMIN C (ASCORBIC ACID) 500 MG TABLET    Take 500 mg by mouth daily.  Modified Medications   No medications on file  Discontinued Medications   No medications on file      Past Medical History:  Diagnosis Date   Abdominal hernia    Anemia    Arthritis    Asthma    Complication of anesthesia    woke up during procedure   Depression    Diabetes (HCC)    GERD (gastroesophageal reflux disease)    Heart murmur    Hypertension     Hypothyroid    Sleep apnea    does not wear CPAP   Stroke ([place])    " mild stroke years ago"   Vocal cord polyps    B/L    Social History   Tobacco Use   Smoking status: Every Day    Types: Cigarettes   Smokeless tobacco: Never  Vaping Use   Vaping Use: Never used  Substance Use Topics   Alcohol use: Not Currently   Drug use: No    Family History  Problem Relation Age of Onset   Kidney cancer Mother    Other Father        murder   Colon cancer Neg Hx    Stomach cancer Neg Hx     Allergies  Allergen Reactions   Shellfish Allergy Shortness Of Breath and Nausea And Vomiting   Contrast Media [Iodinated Contrast Media] Nausea And Vomiting   Metrizamide Nausea And Vomiting    Review of Systems  Constitutional: Negative.   Respiratory: Negative.    Cardiovascular: Negative.   Gastrointestinal: Negative.       OBJECTIVE:    Vitals:   04/11/22 0949  Weight: 151 lb (68.5 kg)     Body mass index is 32.68 kg/m.  Physical Exam Constitutional:      Appearance: Normal appearance.  HENT:     Head: Normocephalic and atraumatic.  Abdominal:     General: There is no distension.     Palpations: Abdomen is soft.  Musculoskeletal:     Cervical back: Normal range of motion and neck supple.  Skin:    General: Skin is warm and dry.  Neurological:     General: No focal deficit present.     Mental Status: She is alert and oriented to person, place, and time.      Labs and Microbiology:     Latest Ref Rng & Units 02/17/2022    7:19 PM 03/02/2019   12:30 PM 02/03/2019    2:55 AM  CBC  WBC 4.0 - 10.5 K/uL 3.6  5.5  8.2   Hemoglobin 12.0 - 15.0 g/dL 9.5  11.2  9.7   Hematocrit 36.0 - 46.0 % 30.2  34.9  30.6   Platelets 150 - 400 K/uL 182  317  150       Latest  Ref Rng & Units 02/28/2022    9:33 AM 02/17/2022    7:19 PM 03/03/2019    2:57 AM  CMP  Glucose 70 - 99 mg/dL 102  101    BUN 8 - 27 mg/dL 15  15    Creatinine 0.57 - 1.00 mg/dL 1.20  1.01  0.81    Sodium 134 - 144 mmol/L 139  139    Potassium 3.5 - 5.2 mmol/L 4.6  3.9    Chloride 96 - 106 mmol/L 101  105    CO2 20 - 29 mmol/L 25  29    Calcium 8.7 - 10.3 mg/dL 9.0  8.7        ASSESSMENT & PLAN:    Syphilis Discussed with patient that her labs are consistent with prior syphilis infection that likely occurred a long time ago.  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  Also recommend checking for HIV and GC/CT as this has not been done previously.  Will give first dose today and schedule for 2 subsequent weekly doses.  She can continue to follow-up with her PCP.   Orders Placed This Encounter  Procedures   HIV Antibody (routine testing w rflx)      Raynelle Highland for Infectious Disease [facility] 04/11/2022, 10:07 AM

## 2022-04-11 NOTE — Patient Instructions (Signed)
Bicillin shot today and again in 1 week and another in 2 weeks for treatment.  Labs and urine today.

## 2022-04-11 NOTE — Assessment & Plan Note (Signed)
Discussed with patient that her labs are consistent with prior syphilis infection that likely occurred a long time ago.  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  Also recommend checking for HIV and GC/CT as this has not been done previously.  Will give first dose today and schedule for 2 subsequent weekly doses.  She can continue to follow-up with her PCP.

## 2022-04-11 NOTE — Telephone Encounter (Signed)
Received call from patient's sister Ivin Booty, stating the patient's brother was in the waiting room and asking for a family member to be present with patient at her appointment today.  Did not provide any information to [facility] regarding the patient's visit today.  [facility] CMA asked patient if she would like to have her brother present in the room; patient declined.  Binnie Kand, RN

## 2022-04-11 NOTE — Addendum Note (Signed)
Addended by: Leatrice Jewels on: 04/11/2022 10:26 AM   Modules accepted: Orders

## 2022-04-12 LAB — HIV ANTIBODY (ROUTINE TESTING W REFLEX): HIV 1&2 Ab, 4th Generation: NONREACTIVE

## 2022-04-14 ENCOUNTER — Ambulatory Visit: Payer: Medicare Other | Admitting: Podiatry

## 2022-04-14 LAB — URINE CYTOLOGY ANCILLARY ONLY
Chlamydia: NEGATIVE
Comment: NEGATIVE
Comment: NORMAL
Neisseria Gonorrhea: NEGATIVE

## 2022-04-18 ENCOUNTER — Ambulatory Visit (INDEPENDENT_AMBULATORY_CARE_PROVIDER_SITE_OTHER): Payer: Medicare Other

## 2022-04-18 ENCOUNTER — Other Ambulatory Visit: Payer: Self-pay

## 2022-04-18 DIAGNOSIS — A539 Syphilis, unspecified: Secondary | ICD-10-CM

## 2022-04-18 MED ORDER — PENICILLIN G BENZATHINE 1200000 UNIT/2ML IM SUSY
1.2000 10*6.[IU] | PREFILLED_SYRINGE | Freq: Once | INTRAMUSCULAR | Status: AC
  Administered 2022-04-18: 1.2 10*6.[IU] via INTRAMUSCULAR

## 2022-04-22 ENCOUNTER — Encounter: Payer: Self-pay | Admitting: Podiatry

## 2022-04-22 ENCOUNTER — Ambulatory Visit (INDEPENDENT_AMBULATORY_CARE_PROVIDER_SITE_OTHER): Payer: Medicare Other | Admitting: Podiatry

## 2022-04-22 DIAGNOSIS — M79675 Pain in left toe(s): Secondary | ICD-10-CM | POA: Diagnosis not present

## 2022-04-22 DIAGNOSIS — B351 Tinea unguium: Secondary | ICD-10-CM

## 2022-04-22 DIAGNOSIS — M79674 Pain in right toe(s): Secondary | ICD-10-CM | POA: Diagnosis not present

## 2022-04-22 DIAGNOSIS — E1142 Type 2 diabetes mellitus with diabetic polyneuropathy: Secondary | ICD-10-CM

## 2022-04-22 DIAGNOSIS — E119 Type 2 diabetes mellitus without complications: Secondary | ICD-10-CM | POA: Insufficient documentation

## 2022-04-22 DIAGNOSIS — Z720 Tobacco use: Secondary | ICD-10-CM

## 2022-04-25 ENCOUNTER — Ambulatory Visit (INDEPENDENT_AMBULATORY_CARE_PROVIDER_SITE_OTHER): Payer: Medicare Other

## 2022-04-25 ENCOUNTER — Other Ambulatory Visit: Payer: Self-pay

## 2022-04-25 DIAGNOSIS — A539 Syphilis, unspecified: Secondary | ICD-10-CM | POA: Diagnosis not present

## 2022-04-25 MED ORDER — PENICILLIN G BENZATHINE 1200000 UNIT/2ML IM SUSY
1.2000 10*6.[IU] | PREFILLED_SYRINGE | Freq: Once | INTRAMUSCULAR | Status: AC
  Administered 2022-04-25: 1.2 10*6.[IU] via INTRAMUSCULAR

## 2022-04-29 NOTE — Progress Notes (Signed)
  Subjective:  Patient ID: Paula Hall, female    DOB: [birth year],  MRN: [id number]  Chief Complaint  Patient presents with   Diabetes    (np) diabetic foot care evaluation-nail trim    61 y.o. female presents with the above complaint. History confirmed with patient.  Her nails are thickened elongated and causing pain.  Objective:  Physical Exam: warm, good capillary refill, no trophic changes or ulcerative lesions, normal DP and PT pulses, and abnormal sensory exam she has paresthesias and loss of protective sensation. Left Foot: dystrophic yellowed discolored nail plates with subungual debris Right Foot: dystrophic yellowed discolored nail plates with subungual debris  Assessment:   1. Pain due to onychomycosis of toenails of both feet   2. Type 2 diabetes mellitus with diabetic polyneuropathy, without long-term current use of insulin ([place])      Plan:  Patient was evaluated and treated and all questions answered.  Patient educated on diabetes. Discussed proper diabetic foot care and discussed risks and complications of disease. Educated patient in depth on reasons to return to the office immediately should he/she discover anything concerning or new on the feet. All questions answered. Discussed proper shoes as well.   Discussed the etiology and treatment options for the condition in detail with the patient. Educated patient on the topical and oral treatment options for mycotic nails. Recommended debridement of the nails today. Sharp and mechanical debridement performed of all painful and mycotic nails today. Nails debrided in length and thickness using a nail nipper to level of comfort. Discussed treatment options including appropriate shoe gear. Follow up as needed for painful nails.    Return in about 3 months (around 07/22/2022) for at risk diabetic foot care.

## 2022-05-14 ENCOUNTER — Other Ambulatory Visit: Payer: Self-pay

## 2022-05-14 MED ORDER — SACUBITRIL-VALSARTAN 24-26 MG PO TABS: 1.0000 | ORAL_TABLET | Freq: Two times a day (BID) | ORAL | 2 refills | Status: AC

## 2022-07-23 ENCOUNTER — Ambulatory Visit: Payer: Medicare HMO | Admitting: Podiatry

## 2022-08-10 ENCOUNTER — Other Ambulatory Visit: Payer: Self-pay

## 2022-08-10 ENCOUNTER — Emergency Department (HOSPITAL_COMMUNITY): Payer: Medicare HMO

## 2022-08-10 ENCOUNTER — Inpatient Hospital Stay (HOSPITAL_COMMUNITY): Payer: Medicare HMO

## 2022-08-10 ENCOUNTER — Encounter (HOSPITAL_COMMUNITY): Payer: Self-pay | Admitting: Student

## 2022-08-10 ENCOUNTER — Inpatient Hospital Stay (HOSPITAL_COMMUNITY)
Admission: EM | Admit: 2022-08-10 | Discharge: 2022-09-03 | DRG: 064 | Disposition: E | Payer: Medicare HMO | Attending: Pulmonary Disease | Admitting: Pulmonary Disease

## 2022-08-10 DIAGNOSIS — E785 Hyperlipidemia, unspecified: Secondary | ICD-10-CM | POA: Diagnosis present

## 2022-08-10 DIAGNOSIS — I639 Cerebral infarction, unspecified: Secondary | ICD-10-CM

## 2022-08-10 DIAGNOSIS — I63521 Cerebral infarction due to unspecified occlusion or stenosis of right anterior cerebral artery: Secondary | ICD-10-CM | POA: Diagnosis present

## 2022-08-10 DIAGNOSIS — C7971 Secondary malignant neoplasm of right adrenal gland: Secondary | ICD-10-CM | POA: Diagnosis present

## 2022-08-10 DIAGNOSIS — I63031 Cerebral infarction due to thrombosis of right carotid artery: Secondary | ICD-10-CM | POA: Diagnosis not present

## 2022-08-10 DIAGNOSIS — J439 Emphysema, unspecified: Secondary | ICD-10-CM | POA: Diagnosis present

## 2022-08-10 DIAGNOSIS — C7889 Secondary malignant neoplasm of other digestive organs: Secondary | ICD-10-CM | POA: Diagnosis present

## 2022-08-10 DIAGNOSIS — I6389 Other cerebral infarction: Secondary | ICD-10-CM

## 2022-08-10 DIAGNOSIS — R29732 NIHSS score 32: Secondary | ICD-10-CM | POA: Diagnosis present

## 2022-08-10 DIAGNOSIS — I9589 Other hypotension: Secondary | ICD-10-CM | POA: Diagnosis not present

## 2022-08-10 DIAGNOSIS — C799 Secondary malignant neoplasm of unspecified site: Secondary | ICD-10-CM | POA: Diagnosis not present

## 2022-08-10 DIAGNOSIS — D696 Thrombocytopenia, unspecified: Secondary | ICD-10-CM | POA: Diagnosis present

## 2022-08-10 DIAGNOSIS — E039 Hypothyroidism, unspecified: Secondary | ICD-10-CM | POA: Diagnosis present

## 2022-08-10 DIAGNOSIS — C7972 Secondary malignant neoplasm of left adrenal gland: Secondary | ICD-10-CM | POA: Diagnosis present

## 2022-08-10 DIAGNOSIS — C787 Secondary malignant neoplasm of liver and intrahepatic bile duct: Secondary | ICD-10-CM | POA: Diagnosis present

## 2022-08-10 DIAGNOSIS — I63231 Cerebral infarction due to unspecified occlusion or stenosis of right carotid arteries: Secondary | ICD-10-CM | POA: Diagnosis not present

## 2022-08-10 DIAGNOSIS — G935 Compression of brain: Secondary | ICD-10-CM | POA: Diagnosis present

## 2022-08-10 DIAGNOSIS — I63512 Cerebral infarction due to unspecified occlusion or stenosis of left middle cerebral artery: Principal | ICD-10-CM | POA: Diagnosis present

## 2022-08-10 DIAGNOSIS — I63511 Cerebral infarction due to unspecified occlusion or stenosis of right middle cerebral artery: Secondary | ICD-10-CM | POA: Diagnosis present

## 2022-08-10 DIAGNOSIS — H518 Other specified disorders of binocular movement: Secondary | ICD-10-CM | POA: Diagnosis present

## 2022-08-10 DIAGNOSIS — R404 Transient alteration of awareness: Secondary | ICD-10-CM | POA: Diagnosis present

## 2022-08-10 DIAGNOSIS — C801 Malignant (primary) neoplasm, unspecified: Secondary | ICD-10-CM | POA: Diagnosis present

## 2022-08-10 DIAGNOSIS — G4730 Sleep apnea, unspecified: Secondary | ICD-10-CM | POA: Diagnosis present

## 2022-08-10 DIAGNOSIS — I6300 Cerebral infarction due to thrombosis of unspecified precerebral artery: Secondary | ICD-10-CM | POA: Diagnosis not present

## 2022-08-10 DIAGNOSIS — I110 Hypertensive heart disease with heart failure: Secondary | ICD-10-CM | POA: Diagnosis present

## 2022-08-10 DIAGNOSIS — Z91199 Patient's noncompliance with other medical treatment and regimen due to unspecified reason: Secondary | ICD-10-CM

## 2022-08-10 DIAGNOSIS — G936 Cerebral edema: Secondary | ICD-10-CM | POA: Diagnosis not present

## 2022-08-10 DIAGNOSIS — E1165 Type 2 diabetes mellitus with hyperglycemia: Secondary | ICD-10-CM | POA: Diagnosis present

## 2022-08-10 DIAGNOSIS — I2510 Atherosclerotic heart disease of native coronary artery without angina pectoris: Secondary | ICD-10-CM | POA: Diagnosis present

## 2022-08-10 DIAGNOSIS — D6859 Other primary thrombophilia: Secondary | ICD-10-CM | POA: Diagnosis present

## 2022-08-10 DIAGNOSIS — Z515 Encounter for palliative care: Secondary | ICD-10-CM

## 2022-08-10 DIAGNOSIS — G8194 Hemiplegia, unspecified affecting left nondominant side: Secondary | ICD-10-CM | POA: Diagnosis present

## 2022-08-10 DIAGNOSIS — Z7189 Other specified counseling: Secondary | ICD-10-CM | POA: Diagnosis not present

## 2022-08-10 DIAGNOSIS — R4701 Aphasia: Secondary | ICD-10-CM | POA: Diagnosis present

## 2022-08-10 DIAGNOSIS — I5042 Chronic combined systolic (congestive) and diastolic (congestive) heart failure: Secondary | ICD-10-CM | POA: Diagnosis present

## 2022-08-10 DIAGNOSIS — C7801 Secondary malignant neoplasm of right lung: Secondary | ICD-10-CM | POA: Diagnosis present

## 2022-08-10 DIAGNOSIS — Z66 Do not resuscitate: Secondary | ICD-10-CM | POA: Diagnosis not present

## 2022-08-10 DIAGNOSIS — R29810 Facial weakness: Secondary | ICD-10-CM | POA: Diagnosis present

## 2022-08-10 DIAGNOSIS — J918 Pleural effusion in other conditions classified elsewhere: Secondary | ICD-10-CM | POA: Diagnosis present

## 2022-08-10 DIAGNOSIS — N179 Acute kidney failure, unspecified: Secondary | ICD-10-CM | POA: Diagnosis present

## 2022-08-10 DIAGNOSIS — I63311 Cerebral infarction due to thrombosis of right middle cerebral artery: Secondary | ICD-10-CM

## 2022-08-10 DIAGNOSIS — J45909 Unspecified asthma, uncomplicated: Secondary | ICD-10-CM | POA: Diagnosis present

## 2022-08-10 DIAGNOSIS — R188 Other ascites: Secondary | ICD-10-CM | POA: Diagnosis present

## 2022-08-10 LAB — POCT I-STAT, CHEM 8
BUN: 38 mg/dL — ABNORMAL HIGH (ref 6–20)
Calcium, Ion: 1.04 mmol/L — ABNORMAL LOW (ref 1.15–1.40)
Chloride: 107 mmol/L (ref 98–111)
Creatinine, Ser: 2.8 mg/dL — ABNORMAL HIGH (ref 0.44–1.00)
Glucose, Bld: 106 mg/dL — ABNORMAL HIGH (ref 70–99)
HCT: 35 % — ABNORMAL LOW (ref 36.0–46.0)
Hemoglobin: 11.9 g/dL — ABNORMAL LOW (ref 12.0–15.0)
Potassium: 4.6 mmol/L (ref 3.5–5.1)
Sodium: 138 mmol/L (ref 135–145)
TCO2: 25 mmol/L (ref 22–32)

## 2022-08-10 LAB — COMPREHENSIVE METABOLIC PANEL
ALT: 32 U/L (ref 0–44)
AST: 58 U/L — ABNORMAL HIGH (ref 15–41)
Albumin: 2.5 g/dL — ABNORMAL LOW (ref 3.5–5.0)
Alkaline Phosphatase: 119 U/L (ref 38–126)
Anion gap: 15 (ref 5–15)
BUN: 41 mg/dL — ABNORMAL HIGH (ref 6–20)
CO2: 23 mmol/L (ref 22–32)
Calcium: 8.1 mg/dL — ABNORMAL LOW (ref 8.9–10.3)
Chloride: 101 mmol/L (ref 98–111)
Creatinine, Ser: 2.52 mg/dL — ABNORMAL HIGH (ref 0.44–1.00)
GFR, Estimated: 12 mL/min — ABNORMAL LOW (ref >60)
Glucose, Bld: 111 mg/dL — ABNORMAL HIGH (ref 70–99)
Potassium: 4.4 mmol/L (ref 3.5–5.1)
Sodium: 139 mmol/L (ref 135–145)
Total Bilirubin: 0.5 mg/dL (ref 0.3–1.2)
Total Protein: 5.8 g/dL — ABNORMAL LOW (ref 6.5–8.1)

## 2022-08-10 LAB — CBC
HCT: 31.9 % — ABNORMAL LOW (ref 36.0–46.0)
HCT: 33.7 % — ABNORMAL LOW (ref 36.0–46.0)
Hemoglobin: 10.3 g/dL — ABNORMAL LOW (ref 12.0–15.0)
Hemoglobin: 10.4 g/dL — ABNORMAL LOW (ref 12.0–15.0)
MCH: 27.2 pg (ref 26.0–34.0)
MCH: 26.7 pg (ref 26.0–34.0)
MCHC: 32.3 g/dL (ref 30.0–36.0)
MCHC: 30.9 g/dL (ref 30.0–36.0)
MCV: 84.4 fL (ref 80.0–100.0)
MCV: 86.6 fL (ref 80.0–100.0)
Platelets: 23 10*3/uL — CL (ref 150–400)
Platelets: 21 10*3/uL — CL (ref 150–400)
RBC: 3.78 MIL/uL — ABNORMAL LOW (ref 3.87–5.11)
RBC: 3.89 MIL/uL (ref 3.87–5.11)
RDW: 23.2 % — ABNORMAL HIGH (ref 11.5–15.5)
RDW: 23.7 % — ABNORMAL HIGH (ref 11.5–15.5)
WBC: 8 10*3/uL (ref 4.0–10.5)
WBC: 8.1 10*3/uL (ref 4.0–10.5)
nRBC: 0.5 % — ABNORMAL HIGH (ref 0.0–0.2)
nRBC: 0.5 % — ABNORMAL HIGH (ref 0.0–0.2)

## 2022-08-10 LAB — I-STAT CHEM 8, ED
BUN: 38 mg/dL — ABNORMAL HIGH (ref 8–23)
Calcium, Ion: 1.04 mmol/L — ABNORMAL LOW (ref 1.15–1.40)
Chloride: 107 mmol/L (ref 98–111)
Creatinine, Ser: 2.8 mg/dL — ABNORMAL HIGH (ref 0.44–1.00)
Glucose, Bld: 106 mg/dL — ABNORMAL HIGH (ref 70–99)
HCT: 35 % — ABNORMAL LOW (ref 36.0–46.0)
Hemoglobin: 11.9 g/dL — ABNORMAL LOW (ref 12.0–15.0)
Potassium: 4.6 mmol/L (ref 3.5–5.1)
Sodium: 138 mmol/L (ref 135–145)
TCO2: 25 mmol/L (ref 22–32)

## 2022-08-10 LAB — DIFFERENTIAL
Abs Immature Granulocytes: 0.06 10*3/uL (ref 0.00–0.07)
Basophils Absolute: 0 10*3/uL (ref 0.0–0.1)
Basophils Relative: 0 %
Eosinophils Absolute: 0 10*3/uL (ref 0.0–0.5)
Eosinophils Relative: 1 %
Immature Granulocytes: 1 %
Lymphocytes Relative: 7 %
Lymphs Abs: 0.5 10*3/uL — ABNORMAL LOW (ref 0.7–4.0)
Monocytes Absolute: 0.5 10*3/uL (ref 0.1–1.0)
Monocytes Relative: 6 %
Neutro Abs: 6.9 10*3/uL (ref 1.7–7.7)
Neutrophils Relative %: 85 %

## 2022-08-10 LAB — ECHOCARDIOGRAM COMPLETE
Area-P 1/2: 3.24 cm2
Calc EF: 43.9 %
Height: 62 in
P 1/2 time: 339 msec
S' Lateral: 3 cm
Single Plane A2C EF: 41.8 %
Single Plane A4C EF: 47.7 %
Weight: 2617.3 oz

## 2022-08-10 LAB — BASIC METABOLIC PANEL
Anion gap: 17 — ABNORMAL HIGH (ref 5–15)
BUN: 44 mg/dL — ABNORMAL HIGH (ref 6–20)
CO2: 18 mmol/L — ABNORMAL LOW (ref 22–32)
Calcium: 8.1 mg/dL — ABNORMAL LOW (ref 8.9–10.3)
Chloride: 107 mmol/L (ref 98–111)
Creatinine, Ser: 2.38 mg/dL — ABNORMAL HIGH (ref 0.44–1.00)
GFR, Estimated: 23 mL/min — ABNORMAL LOW (ref >60)
Glucose, Bld: 99 mg/dL (ref 70–99)
Potassium: 4.8 mmol/L (ref 3.5–5.1)
Sodium: 142 mmol/L (ref 135–145)

## 2022-08-10 LAB — LIPID PANEL
Cholesterol: 226 mg/dL — ABNORMAL HIGH (ref 0–200)
HDL: 59 mg/dL (ref >40)
LDL Cholesterol: 131 mg/dL — ABNORMAL HIGH (ref 0–99)
Total CHOL/HDL Ratio: 3.8 RATIO
Triglycerides: 179 mg/dL — ABNORMAL HIGH (ref <150)
VLDL: 36 mg/dL (ref 0–40)

## 2022-08-10 LAB — HEMOGLOBIN A1C
Hgb A1c MFr Bld: 5.6 % (ref 4.8–5.6)
Mean Plasma Glucose: 114.02 mg/dL

## 2022-08-10 LAB — CBG MONITORING, ED: Glucose-Capillary: 83 mg/dL (ref 70–99)

## 2022-08-10 LAB — TYPE AND SCREEN
ABO/RH(D): A POS
Antibody Screen: NEGATIVE

## 2022-08-10 LAB — GLUCOSE, CAPILLARY
Glucose-Capillary: 114 mg/dL — ABNORMAL HIGH (ref 70–99)
Glucose-Capillary: 110 mg/dL — ABNORMAL HIGH (ref 70–99)
Glucose-Capillary: 114 mg/dL — ABNORMAL HIGH (ref 70–99)
Glucose-Capillary: 102 mg/dL — ABNORMAL HIGH (ref 70–99)
Glucose-Capillary: 107 mg/dL — ABNORMAL HIGH (ref 70–99)
Glucose-Capillary: 114 mg/dL — ABNORMAL HIGH (ref 70–99)

## 2022-08-10 LAB — CK: Total CK: 1915 U/L — ABNORMAL HIGH (ref 38–234)

## 2022-08-10 LAB — MAGNESIUM: Magnesium: 2.2 mg/dL (ref 1.7–2.4)

## 2022-08-10 LAB — APTT: aPTT: 32 seconds (ref 24–36)

## 2022-08-10 LAB — T4, FREE: Free T4: <0.25 ng/dL — ABNORMAL LOW (ref 0.61–1.12)

## 2022-08-10 LAB — PHOSPHORUS: Phosphorus: 4.7 mg/dL — ABNORMAL HIGH (ref 2.5–4.6)

## 2022-08-10 LAB — SODIUM
Sodium: 138 mmol/L (ref 135–145)
Sodium: 144 mmol/L (ref 135–145)
Sodium: 147 mmol/L — ABNORMAL HIGH (ref 135–145)

## 2022-08-10 LAB — ETHANOL: Alcohol, Ethyl (B): <10 mg/dL (ref <10)

## 2022-08-10 LAB — PROTIME-INR
INR: 1.4 — ABNORMAL HIGH (ref 0.8–1.2)
Prothrombin Time: 17.1 seconds — ABNORMAL HIGH (ref 11.4–15.2)

## 2022-08-10 LAB — MRSA NEXT GEN BY PCR, NASAL: MRSA by PCR Next Gen: NOT DETECTED

## 2022-08-10 LAB — TSH: TSH: 72.977 u[IU]/mL — ABNORMAL HIGH (ref 0.350–4.500)

## 2022-08-10 MED ORDER — DOCUSATE SODIUM 50 MG/5ML PO LIQD: 100.0000 mg | Freq: Two times a day (BID) | ORAL | Status: DC | PRN

## 2022-08-10 MED ORDER — INSULIN ASPART 100 UNIT/ML IJ SOLN
0.0000 [IU] | INTRAMUSCULAR | Status: DC
  Administered 2022-08-11 – 2022-08-13 (×7): 1 [IU] via SUBCUTANEOUS

## 2022-08-10 MED ORDER — ORAL CARE MOUTH RINSE: 15.0000 mL | OROMUCOSAL | Status: DC | PRN

## 2022-08-10 MED ORDER — HYDROCORTISONE SOD SUC (PF) 100 MG IJ SOLR
100.0000 mg | Freq: Once | INTRAMUSCULAR | Status: AC
  Administered 2022-08-10: 100 mg via INTRAVENOUS
  Filled 2022-08-10: qty 2

## 2022-08-10 MED ORDER — HYDROCORTISONE SOD SUC (PF) 100 MG IJ SOLR
100.0000 mg | Freq: Three times a day (TID) | INTRAMUSCULAR | Status: DC
  Administered 2022-08-10 – 2022-08-13 (×10): 100 mg via INTRAVENOUS
  Filled 2022-08-10 (×10): qty 2

## 2022-08-10 MED ORDER — CHLORHEXIDINE GLUCONATE CLOTH 2 % EX PADS
6.0000 | MEDICATED_PAD | Freq: Every day | CUTANEOUS | Status: DC
  Administered 2022-08-10 – 2022-08-13 (×4): 6 via TOPICAL

## 2022-08-10 MED ORDER — LEVOTHYROXINE SODIUM 100 MCG/5ML IV SOLN
100.0000 ug | Freq: Every day | INTRAVENOUS | Status: DC
  Administered 2022-08-10 – 2022-08-13 (×4): 100 ug via INTRAVENOUS
  Filled 2022-08-10 (×5): qty 5

## 2022-08-10 MED ORDER — POLYETHYLENE GLYCOL 3350 17 G PO PACK: 17.0000 g | PACK | Freq: Every day | ORAL | Status: DC | PRN

## 2022-08-10 MED ORDER — SODIUM CHLORIDE 3 % IV SOLN
INTRAVENOUS | Status: DC
  Filled 2022-08-10 (×5): qty 500

## 2022-08-10 MED ORDER — SODIUM CHLORIDE 0.9% FLUSH
3.0000 mL | Freq: Once | INTRAVENOUS | Status: AC
  Administered 2022-08-10: 3 mL via INTRAVENOUS

## 2022-08-10 MED ORDER — LEVOTHYROXINE SODIUM 100 MCG/5ML IV SOLN
100.0000 ug | Freq: Once | INTRAVENOUS | Status: AC
  Administered 2022-08-10: 100 ug via INTRAVENOUS
  Filled 2022-08-10: qty 5

## 2022-08-10 MED ORDER — ORAL CARE MOUTH RINSE
15.0000 mL | OROMUCOSAL | Status: DC
  Administered 2022-08-10 – 2022-08-13 (×14): 15 mL via OROMUCOSAL

## 2022-08-10 MED ORDER — SODIUM CHLORIDE 0.9 % IV BOLUS
1000.0000 mL | Freq: Once | INTRAVENOUS | Status: AC
  Administered 2022-08-10: 1000 mL via INTRAVENOUS

## 2022-08-10 MED ORDER — LACTATED RINGERS IV BOLUS: 500.0000 mL | Freq: Once | INTRAVENOUS | Status: DC

## 2022-08-10 NOTE — H&P (Signed)
NAME:  Paula Hall, MRN:  [id number], DOB:  05/05/1874, LOS: 0 ADMISSION DATE:  08/30/2022, CONSULTATION DATE:  08/12/2022 REFERRING MD:  Erin Hearing, CHIEF COMPLAINT:  Unresponsive   History of Present Illness:  Unknown PMH who was BIBEMS after being found by friend unresponsive, unable to move left side and with L eye swelling. Last known well was approximately 3pm 4/5. On EMS arrival she was able to follow command to grip with right hand, R gaze deviation, weakness of LUE. Code stroke called in field.   In ED [facility] revealed large right ACA, MCA strokes with cytotoxic edema/mass effect with 29mm midline shift. Neuro was consulted. Neurosurgery did not recommend decompressive hemicraniectomy at this time.  Pertinent  Medical History  Unknown  Significant Hospital Events: Including procedures, antibiotic start and stop dates in addition to other pertinent events   08/15/2022 admitted, started on hypertonic saline  Interim History / Subjective:    Objective   Blood pressure 108/83, pulse 70, temperature (!) 95.8 F (35.4 C), temperature source Rectal, resp. rate 18, height 5\' 2"  (1.575 m), weight 67.1 kg, SpO2 93 %.       No intake or output data in the 24 hours ending 08/31/2022 0319 Filed Weights   08/12/2022 0159  Weight: 67.1 kg    Examination: General appearance: 62 y.o., female, chronically ill appearing Eyes: PERRL, does not track HENT: NCAT; dry MM Lungs: diminished L>R, with normal respiratory effort CV: RRR, no murmur  Abdomen: protuberant abdomen with midline scar, +bs  Extremities: No peripheral edema, warm Skin: Normal turgor and texture; no rash Neuro: moving RUE spontaneously, extensor response to noxious stim RUE, does not withdraw LUE, withdraws lowers   Resolved Hospital Problem list    Assessment & Plan:   # 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 - neuro following - hypertonic saline at 50/hr per neuro  - frequent neuro checks  - SBP <220 and  avoid hypotension - airway watch  # AKI - check CK - NS bolus - trend bmet, avoid nephrotoxins  # Loculated ascites # Left pleural effusion - CT CAP  # Thrombocytopenia - trend cbc - transfuse for PLT<10 or <100k if active CNS bleeding  # Transaminitis  - mild, repeat LFT in AM  # Severe hypothyroid - hydrocortisone 100 mg IV - IV synthroid 100 mcg   Best Practice (right click and "Reselect all SmartList Selections" daily)   Diet/type: NPO DVT prophylaxis: SCD GI prophylaxis: N/A Lines: N/A Foley:  N/A Code Status:  full code Last date of multidisciplinary goals of care discussion [do not have contact information for this patient at this time]  Labs   CBC: Recent Labs  Lab 08/14/2022 0144  WBC 8.0  NEUTROABS 6.9  HGB 10.3*  11.9*  HCT 31.9*  35.0*  MCV 84.4  PLT 23*    Basic Metabolic Panel: Recent Labs  Lab 08/29/2022 0144  NA 138  K 4.6  CL 107  GLUCOSE 106*  BUN 38*  CREATININE 2.80*   GFR: Estimated Creatinine Clearance: -1.7 mL/min (A) (by C-G formula based on SCr of 2.8 mg/dL (H)). Recent Labs  Lab 08/17/2022 0144  WBC 8.0    Liver Function Tests: No results for input(s): "AST", "ALT", "ALKPHOS", "BILITOT", "PROT", "ALBUMIN" in the last 168 hours. No results for input(s): "LIPASE", "AMYLASE" in the last 168 hours. No results for input(s): "AMMONIA" in the last 168 hours.  ABG    Component Value Date/Time   TCO2 25 09/01/2022 0144  Coagulation Profile: Recent Labs  Lab 08/22/2022 0144  INR 1.4*    Cardiac Enzymes: No results for input(s): "CKTOTAL", "CKMB", "CKMBINDEX", "TROPONINI" in the last 168 hours.  HbA1C: No results found for: "HGBA1C"  CBG: Recent Labs  Lab Aug 22, 2022 0119  GLUCAP 83    Review of Systems:   Unable to obtain in setting of malignant CVA   Past Medical History:  She,  has no past medical history on file.   Surgical History:    Social History:      Family History:  Her family history is  not on file.   Allergies Not on File   Home Medications  Prior to Admission medications   Not on File     Critical care time: 35 minutes

## 2022-08-10 NOTE — Progress Notes (Addendum)
STROKE TEAM PROGRESS NOTE   INTERVAL HISTORY No family at the bedside. Head CT stable. Neuro exam is stable. Na 138 -> 142 Hypertonic saline at 63ml/hr Repeat Head CT at 1400  Associated Chart MRN [id number]   Vitals:   08/24/2022 1000 08/19/2022 1100 08/23/2022 1200 08/05/2022 1300  BP: 111/66 110/67 108/64 119/64  Pulse: 84 82 83 86  Resp: (!) 21 18 17 20   Temp:   97.8 F (36.6 C)   TempSrc:   Axillary   SpO2: 96% 96% 96% 95%  Weight:      Height:       CBC:  Recent Labs  Lab 09/01/2022 0144  WBC 8.0  NEUTROABS 6.9  HGB 11.9*  10.3*  11.9*  HCT 35.0*  31.9*  35.0*  MCV 84.4  PLT 23*   Basic Metabolic Panel:  Recent Labs  Lab 08/09/2022 0144 08/11/2022 0322  NA 138  139  138 138  K 4.6  4.4  4.6  --   CL 107  101  107  --   CO2 23  --   GLUCOSE 106*  111*  106*  --   BUN 38*  41*  38*  --   CREATININE 2.80*  2.52*  2.80*  --   CALCIUM 8.1*  --    Lipid Panel:  Recent Labs  Lab 08/20/2022 0443  CHOL 226*  TRIG 179*  HDL 59  CHOLHDL 3.8  VLDL 36  LDLCALC 031*   HgbA1c:  Recent Labs  Lab 08/12/2022 0443  HGBA1C 5.6   Urine Drug Screen: No results for input(s): "LABOPIA", "COCAINSCRNUR", "LABBENZ", "AMPHETMU", "THCU", "LABBARB" in the last 168 hours.  Alcohol Level  Recent Labs  Lab 08/22/2022 0144  ETH <10    IMAGING past 24 hours ECHOCARDIOGRAM COMPLETE  Result Date: 08/19/2022    ECHOCARDIOGRAM REPORT   Patient Name:   Paula Hall Date of Exam: 08/17/2022 Medical Rec #:  [id number]     Height:       62.0 in Accession #:    [id number]    Weight:       163.6 lb Date of Birth:  03-26-1961     BSA:          1.755 m Patient Age:    61 years      BP:           107/66 mmHg Patient Gender: F             HR:           87 bpm. Exam Location:  Inpatient Procedure: 2D Echo, Color Doppler, Cardiac Doppler and Strain Analysis Indications:     Stroke i63.9  History:         Patient has prior history of Echocardiogram examinations, most                  recent  02/21/2022. Risk Factors:Hypertension, Diabetes and                  Sleep Apnea.  Sonographer:     Irving Burton Senior RDCS Referring Phys:  Aliene Beams Diagnosing Phys: Thurmon Fair MD IMPRESSIONS  1. Left ventricular ejection fraction, by estimation, is 40 to 45%. The left ventricle has mildly decreased function. The left ventricle demonstrates global hypokinesis. There is mild concentric left ventricular hypertrophy. Left ventricular diastolic parameters are consistent with Grade I diastolic dysfunction (impaired relaxation). Elevated left atrial pressure. The average left ventricular global longitudinal strain is 8.8 %.  The global longitudinal strain is abnormal.  2. Right ventricular systolic function is mildly reduced. The right ventricular size is normal.  3. Ascites and irregular liver contour are present.  4. The mitral valve is grossly normal. Trivial mitral valve regurgitation.  5. There is prominent focal thickening of the left coronary cusp. 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 The aortic valve is tricuspid. Aortic valve regurgitation is moderate to severe. Aortic valve sclerosis is present, with no evidence of aortic valve stenosis. Aortic regurgitation PHT measures 339 msec.  6. There is borderline dilatation of the ascending aorta, measuring 37 mm.  7. The inferior vena cava is normal in size with greater than 50% respiratory variability, suggesting right atrial pressure of 3 mmHg. Conclusion(s)/Recommendation(s): Consider a transesophageal echocardiogram to confirm endocarditis if clinically indicated. FINDINGS  Left Ventricle: Left ventricular ejection fraction, by estimation, is 40 to 45%. The left ventricle has mildly decreased function. The left ventricle demonstrates global hypokinesis. The average left ventricular global longitudinal strain is  8.8 %. The global longitudinal strain is abnormal. The left ventricular internal cavity size was normal in size. There is mild concentric left ventricular hypertrophy. Left ventricular diastolic parameters are consistent with Grade I diastolic dysfunction (impaired  relaxation). Elevated left atrial pressure. Right Ventricle: The right ventricular size is normal. No increase in right ventricular wall thickness. Right ventricular systolic function is mildly reduced. Left Atrium: Left atrial size was normal in size. Right Atrium: Right atrial size was normal in size. Pericardium: Ascites and irregular liver contour are present. There is no evidence of pericardial effusion. Mitral Valve: The mitral valve is grossly normal. Trivial mitral valve regurgitation. Tricuspid Valve: The tricuspid valve is grossly normal. Tricuspid valve regurgitation is trivial. Aortic Valve: There is prominent focal thickening of the left coronary cusp. 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 The aortic valve is tricuspid. Aortic valve regurgitation is moderate to severe. Aortic regurgitation PHT measures 339 msec. Aortic valve sclerosis is present, with no evidence of aortic valve stenosis. Pulmonic Valve: The pulmonic valve was grossly normal. Pulmonic valve regurgitation is trivial. Aorta: The aortic root is normal in size and structure. There is borderline dilatation of the ascending aorta, measuring 37 mm. Venous: The inferior vena cava is normal in size with greater than 50% respiratory variability, suggesting right atrial pressure of 3 mmHg. IAS/Shunts: No atrial level shunt detected by color flow Doppler.  LEFT VENTRICLE PLAX 2D LVIDd:         4.20 cm     Diastology LVIDs:         3.00 cm     LV e' medial:    3.81 cm/s LV PW:         1.20 cm     LV E/e' medial:  17.0 LV IVS:        1.20  cm     LV e' lateral:   5.98 cm/s LVOT diam:     1.90 cm     LV E/e' lateral: 10.9 LV SV:         45 LV SV Index:   25          2D Longitudinal Strain LVOT Area:     2.84 cm    2D Strain GLS Avg:     8.8 %  LV Volumes (MOD) LV vol d, MOD A2C: 87.0 ml LV vol d, MOD A4C: 95.3 ml LV vol s, MOD A2C: 50.6 ml LV vol s, MOD A4C: 49.8 ml LV SV MOD A2C:     36.4 ml LV SV MOD A4C:     95.3 ml LV SV MOD BP:      39.8 ml RIGHT VENTRICLE RV S prime:     9.14 cm/s TAPSE (M-mode): 1.7 cm LEFT ATRIUM             Index        RIGHT ATRIUM           Index LA diam:        3.30 cm 1.88 cm/m   RA Area:     12.80 cm LA Vol (A2C):   38.5 ml 21.94 ml/m  RA Volume:   29.40 ml  16.75 ml/m LA Vol (A4C):   46.1 ml 26.27 ml/m LA Biplane Vol: 42.2 ml 24.04 ml/m  AORTIC VALVE LVOT Vmax:   102.00 cm/s LVOT Vmean:  74.700 cm/s LVOT VTI:    0.157 m AI PHT:      339 msec  AORTA Ao Root diam: 2.90 cm Ao Asc diam:  3.70 cm MITRAL VALVE MV Area (PHT): 3.24 cm    SHUNTS MV Decel Time: 234 msec    Systemic VTI:  0.16 m MV E velocity: 64.90 cm/s  Systemic Diam: 1.90 cm MV A velocity: 88.10 cm/s MV E/A ratio:  0.74 Mihai Croitoru MD Electronically signed by Thurmon FairMihai Croitoru MD Signature Date/Time: 08/10/2022/12:46:26 PM    Final (Updated)    CT HEAD WO CONTRAST (5MM)  Result Date: 08/10/2022 CLINICAL DATA:  Female of unknown age status post code stroke presentation with subtotal right hemisphere infarction. EXAM: CT HEAD WITHOUT CONTRAST TECHNIQUE: Contiguous axial images were obtained from the base of the skull through the vertex without intravenous contrast. RADIATION DOSE REDUCTION: This exam was performed according to the departmental dose-optimization program which includes automated exposure control, adjustment of the mA and/or kV according to patient size and/or use of iterative reconstruction technique. COMPARISON:  Head CT 0122 hours today. FINDINGS: Brain: Subtotal right hemisphere cytotoxic edema redemonstrated sparing only the right PCA  territory including the right thalamus. No hemorrhagic transformation in the right hemisphere. But intracranial mass effect including effaced right lateral ventricle and leftward midline shift of 8 mm which is stable. Similar cytotoxic edema in the posterior left hemisphere at the watershed area of the distal ACA, MCA, and PCA territories. No hemorrhagic transformation there. Stable gray-white differentiation elsewhere, including brainstem and cerebellum. Difficult to exclude early trapping of the left lateral ventricle, left occipital horn is mildly dilated but the left temporal horn remains diminutive. Basilar cisterns remain normal. Vascular: Hyperdense right ICA terminus, right M1. Skull: Intact, negative. Sinuses/Orbits: Mild left mastoid effusion. Other Visualized paranasal sinuses and mastoids are clear. Other: Left lateral and superior convexity scalp hematoma. Left periorbital superficial hematoma. Globes and intraorbital soft tissues remain normal. IMPRESSION: 1. 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 - contralateral similar appearing cytotoxic edema at the posterior left ACA/MCA/PCA watershed area. - No hemorrhagic transformation. 2. Stable intracranial mass effect including 8 mm of leftward midline shift. Difficult to exclude early trapping of the left lateral ventricle. Basilar cisterns remain normal. 3. Left lateral and periorbital scalp hematoma. Electronically Signed   By: Odessa FlemingH  Hall M.D.   On: 08/10/2022 08:38   CT  CHEST ABDOMEN PELVIS WO CONTRAST  Result Date: 08/10/2022 CLINICAL DATA:  Evaluate left pleural effusion, loculated ascites. EXAM: CT CHEST, ABDOMEN AND PELVIS WITHOUT CONTRAST TECHNIQUE: Multidetector CT imaging of the chest, abdomen and pelvis was performed following the standard protocol without IV contrast. RADIATION DOSE REDUCTION: This exam was performed according to the departmental dose-optimization  program which includes automated exposure control, adjustment of the mA and/or kV according to patient size and/or use of iterative reconstruction technique. COMPARISON:  Only relevant comparisons today's portable chest. FINDINGS: CT CHEST FINDINGS Cardiovascular: The heart is moderately enlarged with left chamber predominance. There are three-vessel coronary artery calcifications, no significant pericardial effusion. The cardiac blood pool is lower in density than the myocardium suggesting anemia. There is aortic atherosclerosis, scattered plaques in the great vessels. No aneurysm is seen. The pulmonary trunk is upper normal caliber. Pulmonary veins are nondistended. Mediastinum/Nodes: No thyroid nodule is seen. Axillary spaces or free of adenopathy. There are shotty subcentimeter mediastinal nodes in the left anterior prevascular space subcarinal area but no enlarged intrathoracic lymph nodes are seen without contrast. The thoracic trachea, both main bronchi, and thoracic esophagus are unremarkable. Lungs/Pleura: Small to moderate left pleural effusion. There is adjacent patchy consolidation or atelectasis in the left lower lobe basal segments. Opacity more in keeping with consolidations noted in the posterior lingula, probable pneumonia. There are mild-to-moderate emphysematous changes with centrilobular changes predominating and with paraseptal change in the apical areas. There is an accessory azygous lobe and fissure in the medial right apex. There are coarse linear markings in the right lower lobe which could be atelectasis or linear scarring. There is a 6 mm pleural-based right upper lobe nodule anteriorly on 5:57, and scattered tiny 2-3 mm nodules, for example in the right upper lobe on 5:37, 5:63 in the right middle lobe anteriorly on 5:94, and in the left temporal lobe on 5:57 and 5:46. The nodules are nonspecific in light of the patient's breast masses and innumerable metastases in the liver. Metastases  are not excluded. Rest of the lungs are clear.  There are no right pleural fluid. Musculoskeletal: There are healed fracture deformities of some of the posteromedial right ribs but no destructive osseous lesion is seen. There is thoracic kyphosis, mild dextroscoliosis and multilevel bridging enthesopathy, osteopenia and degenerative changes. Diffuse chest wall anasarca is noted eccentric to the left possibly positional. In the posterior lower outer left breast, there is an ovoid well-circumscribed solid mass measuring 4.6 x 2.3 x 4.4 cm. In the lower outer right breast, a smaller solid mass also oval and well-circumscribed is noted and measures 2.5 x 1.2 x 2.4 cm. These masses could represent a primary or metastatic neoplasms or fibroadenomas. CT ABDOMEN PELVIS FINDINGS Hepatobiliary: The liver is 15.4 cm in length. There are numerable diffuse hypodense masses of the liver consistent with metastases. Index lesions are as follows: In the dome of segment 8, index lesion measures 2.6 cm; Inferiorly in segment 5, index lesion measuring 3.6 cm on 3:60; In segment 4B, index lesion measuring 3.3 cm on 3:51; In segment 2, index lesion measuring 2.6 cm on 3:47. Most of the metastases are subcentimeter up to about 1.5 cm in size. Gallbladder and bile ducts are unremarkable. Pancreas: There is a 2.2 cm hypodensity on 3:62 in the pancreatic head concerning for a mass. No other focal abnormality is seen without contrast no ductal dilatation. Spleen: No abnormality. Adrenals/Urinary Tract: The adrenal glands are not well seen due to ascites but there does appear to  be bilateral adrenal fullness. Unclear if this is due to adenomatous hyperplasia or infiltrating disease. No focal abnormality is seen in the unenhanced renal cortex and no urinary stones or obstruction. There is no bladder thickening. 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 The bypassed stomach is contracted and not well seen. No small bowel dilatation or  wall thickening is evident. An appendix is not seen in this patient. The large bowel wall without focal abnormality. There is mild thickening versus nondistention the splenic flexure and descending colon. There is generalized mesenteric edema/congestion. Vascular/Lymphatic: Heavy aortic and branch vessel atherosclerosis. No AAA. No appreciable adenopathy without contrast. Reproductive: Uterus and bilateral adnexa are unremarkable. Other: Moderate diffuse free ascites. Diffuse moderate to severe body wall anasarca. There is diffuse abdominal wall laxity and outward protrusion with wide rectus diastasis. No incarcerated hernia is seen. There is no free air, free hemorrhage or abscess. Musculoskeletal: No destructive bone lesion is seen. There is osteopenia with degenerative changes of the lumbar spine. IMPRESSION: 1. Cardiomegaly with small to moderate left pleural effusion. 2. Left lower lobe and lingular consolidations most likely due to pneumonia. 3. Scattered subcentimeter lung nodules, largest 6 mm in the right upper lobe. These are nonspecific in light of the patient's breast masses and innumerable metastases in the liver. Mets not excluded. 4. Emphysema. 5. Aortic and coronary artery atherosclerosis. 6. Low-density cardiac blood pool suggesting anemia. 7. 2.2 cm hypodensity in the pancreatic head concerning for a mass. MRI without and with contrast recommended. 8. Innumerable liver metastases. 9. Bilateral lower outer breast solid masses measuring 4.6 x 2.3 x 4.4 cm on the left and 2.5 x 1.2 x 2.4 cm on the right. These could represent primary or metastatic neoplasms or fibroadenomas. 10. Moderate free ascites. 11. Adrenal glands not well seen but there does appear to be bilateral adrenal fullness. Unclear if this is adenomatous hyperplasia or infiltrating disease. 12. Diffuse mesenteric edema/congestion and body wall anasarca greatest in the abdomen. 13. Thickening versus nondistention of the splenic flexure  and descending colon. Colitis not excluded. 14. Diffuse abdominal wall laxity and outward protrusion with wide rectus diastasis. No incarcerated hernia. 15. Osteopenia and degenerative changes. Aortic Atherosclerosis (ICD10-I70.0) and Emphysema (ICD10-J43.9). Electronically Signed   By: Almira Bar M.D.   On: 08/10/2022 07:04   DG Chest Portable 1 View  Result Date: 08/10/2022 CLINICAL DATA:  Altered mental status with head CT showing bilateral infarcts. EXAM: PORTABLE CHEST 1 VIEW COMPARISON:  None Available. FINDINGS: There is mild-to-moderate cardiomegaly. No vascular congestion is seen. The mediastinum is normally outlined. There is calcification of the transverse aorta. There is a moderate-sized left pleural effusion with overlying atelectasis or consolidation. There is band atelectasis in the hypoinflated right base. The remaining lungs are clear. There are degenerative changes of the spine and mild osteopenia. IMPRESSION: 1. Moderate-sized left pleural effusion with overlying atelectasis or consolidation. 2. Cardiomegaly without evidence of vascular congestion. 3. Aortic atherosclerosis. Electronically Signed   By: Almira Bar M.D.   On: 08/10/2022 02:03   CT HEAD CODE STROKE WO CONTRAST  Result Date: 08/10/2022 CLINICAL DATA:  Code stroke.  Unresponsive EXAM: CT HEAD WITHOUT CONTRAST TECHNIQUE: Contiguous axial images were obtained from the base of the skull through the vertex without intravenous contrast. RADIATION DOSE REDUCTION: This exam was performed according to the departmental dose-optimization program which includes automated exposure control, adjustment of the mA and/or kV according to patient size and/or use of iterative reconstruction technique. COMPARISON:  None Available. FINDINGS: Brain:  Hypodensity and sulcal effacement involving the right ACA and MCA territory, with relative sparing of the right PCA territory, with sulcal effacement and loss of gray-white differentiation. This  also involves the right basal ganglia with sparing of the medial thalamus. Additional hypodensity is seen in the left parietal and occipital lobes, which may be more chronic in the occipital lobe. No evidence of hemorrhagic transformation. Mass effect on the right lateral ventricle and third ventricle, with 8 mm of right-to-left midline shift at the level of the thalami. No evidence of ventricular entrapment in the left lateral ventricle. The basilar cisterns remain patent. Vascular: Hyperdensity in the right MCA territory, which may reflect thrombus or be relative to the hypodense right cerebral hemisphere. Skull: Normal. Negative for fracture or focal lesion. Sinuses/Orbits: Clear paranasal sinuses. No acute finding in the orbits. Left periorbital edema. Other: Fluid in the left mastoid air cells. ASPECTS [facility] Stroke Program Early CT Score) - Ganglionic level infarction (caudate, lentiform nuclei, internal capsule, insula, M1-M3 cortex): 0 - Supraganglionic infarction (M4-M6 cortex): 0 Total score (0-10 with 10 being normal): 0 IMPRESSION: 1. Large acute infarct involving the right ACA and MCA territory, with relative sparing of the right PCA territory, with additional likely acute infarct in the left parietal lobe and more likely chronic infarct in the left occipital lobe. No evidence of hemorrhagic transformation. 2. Extensive cytotoxic edema with sulcal effacement and resulting mass effect on the right lateral ventricle and third ventricle, without evidence of entrapment in the left lateral ventricle. 8 mm of right-to-left midline shift at the level of the thalami. The basilar cisterns remain patent. 3. Hyperdensity in the right MCA territory, which may reflect thrombus or be pseudo hyperdensity secondary to the hypodense right cerebral hemisphere. 4. ASPECTS is 0 Imaging results were communicated on 08/10/2022 at 1:31 am to provider Dr. Otelia Limes via secure text paging. Electronically Signed   By: Wiliam Ke M.D.   On: 08/10/2022 01:37    PHYSICAL EXAM HEENT-  [place]/AT. Subcutaneous edema in left periorbital region without bruising.    Lungs- Respirations unlabored but with occasional sonorous quality Extremities- Left upper extremity with mild edema   Neurological Examination Mental Status: Obtunded. Eyes closed Cranial Nerves: II: PERRL No blink to threat.  III,IV, VI: Eyes are open. Does not gaze towards or away from visual stimuli. Right gaze preference which can be overcome with oculocephalic maneuver.  V: Brisk right corneal reflex, weak left corneal reflex  VII: Right facial droop VIII: Not responding to voice IX,X: Gag reflex deferred XI: Weak on the left. Head preferentially turned to the left.  XII: Unable to assess Motor/Sensory: Not following commands for formal motor testing.  RUE: Moves hand towards location of sternal rub but not antigravity LUE with flaccid tone RLE with flaccid tone LLE with triple flexion response to noxious.  Plantars: Right toe down going Left toe upgoing  Cerebellar/Gait: Unable to assess   ASSESSMENT/PLAN Paula Hall is a 62 y.o. female with history of abdominal hernia, anemia, asthma, depression, diabetes, GERD, heart murmur, hypertension, congestive heart failure, hypothyroidism stroke, sleep apnea noncompliant with CPAP. She presented to the ED from home via EMS as a code stroke for unresponsiveness and left hemiplegia.  His last seen on Friday around 3 PM when a friend visited her home.  No one had heard from her since then and a friend went to check on her and found her sitting in the chair unresponsive with swelling to her left eye.  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 Etiology:  T ICA occlusion   Code Stroke CT head Large  acute infarct involving the right ACA and MCA territory, with relative sparing of the right PCA territory, with additional likely acute infarct in the left parietal lobe and more likely chronic infarct in the left occipital lobe. No evidence of hemorrhagic transformation. Extensive cytotoxic edema with sulcal effacement and resulting mass effect on the right lateral ventricle and third ventricle, without evidence of entrapment in the left lateral ventricle. 8 mm of right-to-left midline shift at the level of the thalami. The basilar cisterns remain patent. Hyperdensity in the right MCA territory, which may reflect thrombus or be pseudo hyperdensity secondary to the hypodense right cerebral hemisphere. ASPECTS is 0  CT Head Repeat-right cerebellar hemisphere infarction from a right ACAT occlusion.  Contralateral similar appearing cytotoxic edema at the posterior left MCA/ACA/PCA watershed area.  8 mm leftward shift 2D Echo EF 40-45%, grade 1 diastolic dysfunction  LDL 131 KYHC6C 5.6 VTE prophylaxis - SCDs    Diet   Diet NPO time specified   No antithrombotic prior to admission, now on No antithrombotic.  Therapy recommendations:  Pending Disposition:  pendin g  Hypertension Home meds: Lasix, Entresto, Aldactone Stable Permissive hypertension (OK if < 220/120) but gradually normalize in 5-7 days Long-term BP goal normotensive  Hyperlipidemia Home meds: Crestor, resumed in hospital when able to take PO LDL 131, goal < 70 Continue statin at discharge  Diabetes type II Uncontrolled Home meds: Metformin, Jardiance HgbA1c 5.6, goal < 7.0 CBGs Recent Labs    2022/09/07 0419 07-Sep-2022 0753 2022-09-07 1147  GLUCAP 114* 110* 114*    SSI  Other Stroke Risk Factors Advanced Age >/= 18  Hx stroke/TIA Congestive heart failure- Sees Dr. Donato Schultz   Lasix, Sherryll Burger, Aldactone Sleep apnea  Other Active Problems Hypothyroidism TSH 72.977 T4 less than 0.25 Home synthroid dose  Thrombocytopenia  Plt 23 Acute kidney injury CK - 1915 Cr - 2.8  Hospital day # 0  Patient seen and examined by NP/APP with MD. MD to update note as needed.   Elmer Picker, DNP, FNP-BC [facility] Pager: [phone]   I have personally obtained history,examined this patient, reviewed notes, independently viewed imaging studies, participated in medical decision making and plan of care.ROS completed by me personally and pertinent positives fully documented  I have made any additions or clarifications directly to the above note. This patient is critically ill and is a risk for neurological worsening, including death from brain edema, brain herniation. This patient's care requires constant monitoring of vital signs, hemodynamics, respiratory and cardiac monitoring, review of multiple databases, neurological assessment, discussion with family, other specialists and medical decision making of high complexity. I spent 40 minutes of neurocritical care time in the care of this patient. Agree with note above.     Windell Norfolk, MD Neurology 07-Sep-2022 3:09 PM    To contact Stroke Continuity provider, please refer to [email or web address]. After hours, contact General Neurology

## 2022-08-10 NOTE — ED Notes (Signed)
ED TO INPATIENT HANDOFF REPORT  ED Nurse Name and Phone #: Lowanda FosterMichael Jamaris Biernat ext [phone]    S Name/Age/Gender [place] [age] y.o. female Room/Bed: TRAAC/TRAAC  Code Status   Code Status: Full Code  Home/SNF/Other Home Patient oriented to: Patient responds to pain. Spontaneous eye movement with grunting sounds.  Is this baseline? No   Triage Complete: Triage complete  Chief Complaint Stroke [I63.9]  Triage Note PT BIBA as a code stroke. LKW was 08/08/2022 around 9pm. As per EMS pt was found sitting up on air mattress with altered mental status. Unknown if patient is on blood thinners.    Allergies Not on File  Level of Care/Admitting Diagnosis ED Disposition     ED Disposition  Admit   Condition  --   Comment  [facility] [[id number]]  Level of Care: ICU [6]  May admit patient to [facility] or [facility] if equivalent level of care is available:: No  Covid Evaluation: Asymptomatic - no recent exposure (last 10 days) testing not required  Diagnosis: Stroke [[id number]][[id number]]  Admitting Physician: Paula PersonMEIER, NATHANIEL M [[id number]]  Attending Physician: Paula PersonMEIER, NATHANIEL M [phone][[id number]]  Certification:: I certify this patient will need inpatient services for at least 2 midnights  Estimated Length of Stay: 5          B Medical/Surgery History History reviewed. No pertinent past medical history. History reviewed. No pertinent surgical history.   A IV Location/Drains/Wounds Patient Lines/Drains/Airways Status     Active Line/Drains/Airways     Name Placement date Placement time Site Days   Peripheral IV 08/10/22 20 G 1.25" Anterior;Distal;Right;Upper Arm 08/10/22  0141  Arm  less than 1            Intake/Output Last 24 hours No intake or output data in the 24 hours ending 08/10/22 0327  Labs/Imaging Results for orders placed or performed during the hospital encounter of 08/10/22 (from the past 48 hour(s))  CBG monitoring, ED     Status:  None   Collection Time: 08/10/22  1:19 AM  Result Value Ref Range   Glucose-Capillary 83 70 - 99 mg/dL    Comment: Glucose reference range applies only to samples taken after fasting for at least 8 hours.  Protime-INR     Status: Abnormal   Collection Time: 08/10/22  1:44 AM  Result Value Ref Range   Prothrombin Time 17.1 (H) 11.4 - 15.2 seconds   INR 1.4 (H) 0.8 - 1.2    Comment: (NOTE) INR goal varies based on device and disease states. Performed at [facility], [address] [place], [place] [address]   APTT     Status: None   Collection Time: 08/10/22  1:44 AM  Result Value Ref Range   aPTT 32 24 - 36 seconds    Comment: Performed at [facility], [address] [place], [place] [address]  CBC     Status: Abnormal   Collection Time: 08/10/22  1:44 AM  Result Value Ref Range   WBC 8.0 4.0 - 10.5 K/uL   RBC 3.78 (L) 3.87 - 5.11 MIL/uL   Hemoglobin 10.3 (L) 12.0 - 15.0 g/dL   HCT 13.231.9 (L) 44.036.0 - 10.246.0 %   MCV 84.4 80.0 - 100.0 fL   MCH 27.2 26.0 - 34.0 pg   MCHC 32.3 30.0 - 36.0 g/dL   RDW 72.523.2 (H) 36.611.5 - 44.015.5 %   Platelets 23 (LL) 150 - 400 K/uL  Comment: SPECIMEN CHECKED FOR CLOTS Immature Platelet Fraction may be clinically indicated, consider ordering this additional test [id number] REPEATED TO VERIFY PLATELET COUNT CONFIRMED BY SMEAR    nRBC 0.5 (H) 0.0 - 0.2 %    Comment: Performed at [facility], [address], [place], [place] [address]  Differential     Status: Abnormal   Collection Time: 08/10/22  1:44 AM  Result Value Ref Range   Neutrophils Relative % 85 %   Neutro Abs 6.9 1.7 - 7.7 K/uL   Lymphocytes Relative 7 %   Lymphs Abs 0.5 (L) 0.7 - 4.0 K/uL   Monocytes Relative 6 %   Monocytes Absolute 0.5 0.1 - 1.0 K/uL   Eosinophils Relative 1 %   Eosinophils Absolute 0.0 0.0 - 0.5 K/uL   Basophils Relative 0 %   Basophils Absolute 0.0 0.0 - 0.1 K/uL   Immature Granulocytes 1 %   Abs Immature Granulocytes 0.06 0.00 - 0.07 K/uL    Bite Cells PRESENT    Burr Cells PRESENT    Polychromasia PRESENT    Target Cells PRESENT     Comment: Performed at [facility], [address], [place], [place] [address]  Comprehensive metabolic panel     Status: Abnormal   Collection Time: 08/10/22  1:44 AM  Result Value Ref Range   Sodium 139 135 - 145 mmol/L   Potassium 4.4 3.5 - 5.1 mmol/L   Chloride 101 98 - 111 mmol/L   CO2 23 22 - 32 mmol/L   Glucose, Bld 111 (H) 70 - 99 mg/dL    Comment: Glucose reference range applies only to samples taken after fasting for at least 8 hours.   BUN 41 (H) 8 - 23 mg/dL   Creatinine, Ser 4.69 (H) 0.44 - 1.00 mg/dL   Calcium 8.1 (L) 8.9 - 10.3 mg/dL   Total Protein 5.8 (L) 6.5 - 8.1 g/dL   Albumin 2.5 (L) 3.5 - 5.0 g/dL   AST 58 (H) 15 - 41 U/L   ALT 32 0 - 44 U/L   Alkaline Phosphatase 119 38 - 126 U/L   Total Bilirubin 0.5 0.3 - 1.2 mg/dL   GFR, Estimated 12 (L) >60 mL/min    Comment: (NOTE) Calculated using the CKD-EPI Creatinine Equation (2021)    Anion gap 15 5 - 15    Comment: Performed at [facility], [address] [place], [place] [address]  Ethanol     Status: None   Collection Time: 08/10/22  1:44 AM  Result Value Ref Range   Alcohol, Ethyl (B) <10 <10 mg/dL    Comment: (NOTE) Lowest detectable limit for serum alcohol is 10 mg/dL.  For medical purposes only. Performed at [facility], [address], [place], [place] [address]   I-stat chem 8, ED     Status: Abnormal   Collection Time: 08/10/22  1:44 AM  Result Value Ref Range   Sodium 138 135 - 145 mmol/L   Potassium 4.6 3.5 - 5.1 mmol/L   Chloride 107 98 - 111 mmol/L   BUN 38 (H) 8 - 23 mg/dL   Creatinine, Ser 4.40 (H) 0.44 - 1.00 mg/dL   Glucose, Bld 102 (H) 70 - 99 mg/dL    Comment: Glucose reference range applies only to samples taken after fasting for at least 8 hours.   Calcium, Ion 1.04 (L) 1.15 - 1.40 mmol/L   TCO2 25 22 - 32 mmol/L   Hemoglobin 11.9 (  L) 12.0 - 15.0 g/dL   HCT 48.2  (L) 50.0 - 46.0 %  T4, free     Status: Abnormal   Collection Time: 2022/08/18  1:44 AM  Result Value Ref Range   Free T4 <0.25 (L) 0.61 - 1.12 ng/dL    Comment: (NOTE) Biotin ingestion may interfere with free T4 tests. If the results are inconsistent with the TSH level, previous test results, or the clinical presentation, then consider biotin interference. If needed, order repeat testing after stopping biotin. Performed at [facility], [address], [place], [place] [address]   TSH     Status: Abnormal   Collection Time: 2022-08-18  1:44 AM  Result Value Ref Range   TSH 72.977 (H) 0.350 - 4.500 uIU/mL    Comment: Performed by a 3rd Generation assay with a functional sensitivity of <=0.01 uIU/mL. Performed at [facility], [address], [place], [place] [address]    DG Chest Portable 1 View  Result Date: Aug 18, 2022 CLINICAL DATA:  Altered mental status with head CT showing bilateral infarcts. EXAM: PORTABLE CHEST 1 VIEW COMPARISON:  None Available. FINDINGS: There is mild-to-moderate cardiomegaly. No vascular congestion is seen. The mediastinum is normally outlined. There is calcification of the transverse aorta. There is a moderate-sized left pleural effusion with overlying atelectasis or consolidation. There is band atelectasis in the hypoinflated right base. The remaining lungs are clear. There are degenerative changes of the spine and mild osteopenia. IMPRESSION: 1. Moderate-sized left pleural effusion with overlying atelectasis or consolidation. 2. Cardiomegaly without evidence of vascular congestion. 3. Aortic atherosclerosis. Electronically Signed   By: Almira Bar M.D.   On: 08-18-22 02:03   CT HEAD CODE STROKE WO CONTRAST  Result Date: 08-18-2022 CLINICAL DATA:  Code stroke.  Unresponsive EXAM: CT HEAD WITHOUT CONTRAST TECHNIQUE: Contiguous axial images were obtained from the base of the skull through the vertex without intravenous contrast. RADIATION DOSE  REDUCTION: This exam was performed according to the departmental dose-optimization program which includes automated exposure control, adjustment of the mA and/or kV according to patient size and/or use of iterative reconstruction technique. COMPARISON:  None Available. FINDINGS: Brain: Hypodensity and sulcal effacement involving the right ACA and MCA territory, with relative sparing of the right PCA territory, with sulcal effacement and loss of gray-white differentiation. This also involves the right basal ganglia with sparing of the medial thalamus. Additional hypodensity is seen in the left parietal and occipital lobes, which may be more chronic in the occipital lobe. No evidence of hemorrhagic transformation. Mass effect on the right lateral ventricle and third ventricle, with 8 mm of right-to-left midline shift at the level of the thalami. No evidence of ventricular entrapment in the left lateral ventricle. The basilar cisterns remain patent. Vascular: Hyperdensity in the right MCA territory, which may reflect thrombus or be relative to the hypodense right cerebral hemisphere. Skull: Normal. Negative for fracture or focal lesion. Sinuses/Orbits: Clear paranasal sinuses. No acute finding in the orbits. Left periorbital edema. Other: Fluid in the left mastoid air cells. ASPECTS [facility] Stroke Program Early CT Score) - Ganglionic level infarction (caudate, lentiform nuclei, internal capsule, insula, M1-M3 cortex): 0 - Supraganglionic infarction (M4-M6 cortex): 0 Total score (0-10 with 10 being normal): 0 IMPRESSION: 1. Large acute infarct involving the right ACA and MCA territory, with relative sparing of the right PCA territory, with additional likely acute infarct in the left parietal lobe and more likely chronic infarct in the left occipital lobe. No evidence of  hemorrhagic transformation. 2. Extensive cytotoxic edema with sulcal effacement and resulting mass effect on the right lateral ventricle and third  ventricle, without evidence of entrapment in the left lateral ventricle. 8 mm of right-to-left midline shift at the level of the thalami. The basilar cisterns remain patent. 3. Hyperdensity in the right MCA territory, which may reflect thrombus or be pseudo hyperdensity secondary to the hypodense right cerebral hemisphere. 4. ASPECTS is 0 Imaging results were communicated on 08/10/2022 at 1:31 am to provider Dr. Otelia Limes via secure text paging. Electronically Signed   By: Wiliam Ke M.D.   On: 08/10/2022 01:37    Pending Labs Unresulted Labs (From admission, onward)     Start     Ordered   08/10/22 0500  CBC  Tomorrow morning,   R        08/10/22 0321   08/10/22 0500  Basic metabolic panel  Tomorrow morning,   R        08/10/22 0321   08/10/22 0500  Magnesium  Tomorrow morning,   R        08/10/22 0321   08/10/22 0500  Phosphorus  Tomorrow morning,   R        08/10/22 0321   08/10/22 0500  Lipid panel  Tomorrow morning,   R        08/10/22 0323   08/10/22 0324  Hemoglobin A1c  Once,   R       Comments: To assess prior glycemic control    08/10/22 0323   08/10/22 0200  Sodium  Now then every 6 hours,   R      08/10/22 0200   08/10/22 0122  Urinalysis, w/ Reflex to Culture (Infection Suspected) -Urine, Clean Catch  Once,   URGENT       Question:  Specimen Source  Answer:  Urine, Clean Catch   08/10/22 0121            Vitals/Pain Today's Vitals   08/10/22 0133 08/10/22 0145 08/10/22 0159  BP: 110/77 108/83   Pulse: 72 70   Resp: 18 18   Temp: (!) 95.6 F (35.3 C) (!) 95.8 F (35.4 C)   TempSrc: Rectal Rectal   SpO2: 93% 93%   Weight:   67.1 kg  Height:   5\' 2"  (1.575 m)    Isolation Precautions No active isolations  Medications Medications  sodium chloride (hypertonic) 3 % solution ( Intravenous New Bag/Given 08/10/22 0225)  lactated ringers bolus 500 mL (has no administration in time range)  docusate (COLACE) 50 MG/5ML liquid 100 mg (has no administration in time  range)  polyethylene glycol (MIRALAX / GLYCOLAX) packet 17 g (has no administration in time range)  insulin aspart (novoLOG) injection 0-9 Units (has no administration in time range)  sodium chloride flush (NS) 0.9 % injection 3 mL (3 mLs Intravenous Given 08/10/22 0221)    Mobility Unknown mobility status       Focused Assessments Neuro Assessment Handoff:  Swallow screen pass? No          Neuro Assessment: Exceptions to WDL Neuro Checks:      Has TPA been given? No If patient is a Neuro Trauma and patient is going to OR before floor call report to 4N Charge nurse: [phone] or [phone]   R Recommendations: See Admitting Provider Note  Report given to:   Additional Notes:  Unable to obtain additional information. EMS did not provide a family contact nor has no one called or  came to ED.

## 2022-08-10 NOTE — Progress Notes (Addendum)
Attempted to call family no answer - Also re: severe hypothyroidism  Currently TSH 73 and free t 4 <25. Got 100 mcg IV. Will give additional 100 mcg as usual load should be 200, then cont 110mcg/d. Will also cont stress dose steroids.   Simonne Martinet ACNP-BC [facility] Pulmonary/Critical Care Pager # [phone] OR # [phone] if no answer

## 2022-08-10 NOTE — Progress Notes (Signed)
Echocardiogram 2D Echocardiogram has been performed.  Warren Lacy Ilo Beamon RDCS 08/11/2022, 12:27 PM

## 2022-08-10 NOTE — ED Triage Notes (Signed)
PT BIBA as a code stroke. LKW was 08/08/2022 around 9pm. As per EMS pt was found sitting up on air mattress with altered mental status. Unknown if patient is on blood thinners.

## 2022-08-10 NOTE — Progress Notes (Signed)
eLink Physician-Brief Progress Note Patient Name: Paula Hall DOB: 05/05/1874 MRN: [id number]   Date of Service  08/06/2022  HPI/Events of Note  Patient being admitted with unknown past medical history was found down by a friend, unresponsive, not moving his left side and with a last known well out of the window for potential intervention.  CT reveals large right ACA and MCA strokes with significant cerebral edema and mass effect with 8 mm midline shift.  Metabolic panel consistent with acute kidney injury, CBC consistent with thrombocytopenia at 23, INR elevated, thyroid function consistent with hypothyroidism.  Left-sided pleural effusion was present on chest radiography.  Given the extent of injury, patient was started on hypertonic saline and supportive management.  eICU Interventions  Global findings are concerning for prolonged downtime after an acute neurological event.  Her ascites, pleural effusion, thrombocytopenia and transaminitis always a possibility of potential acute versus chronic liver disease.  Agree with maintaining platelets greater than 10,000 for now, no evidence of acute intracranial bleed.  We reviewed, given no evidence of intracranial bleed, restrict BP to less than 220.  Neurosurgery already consulted and recommending against decompressive surgery.  She is currently protecting her airway, responds to painful stimulus, no obvious aspiration.  No acute intervention is indicated at this time.  No acute PRNs are required at this time.   0402 - minimal urine o/p, bladder scan 320. Pt with AKI creat 2.38; Requesting foley, although she doesn't meet criteria for foley. IO cath as needed for retention.  Intervention Category Evaluation Type: New Patient Evaluation  Paula Hall 08/14/2022, 4:14 AM

## 2022-08-10 NOTE — Consult Note (Addendum)
NEURO HOSPITALIST CONSULT NOTE   Requesting physician: Dr. Clayborne Dana  Reason for Consult: Right MCA and ACA strokes with mass effect   History obtained from: EMS and Chart     HPI:                                                                                                                                          Paula Hall is an 62 y.o. female with an unknown PMHx who presents to the ED from home via EMS as a Code Stroke for new unresponsiveness and left hemiplegia. LKN was 3 PM on Friday when a friend visited her home. Afterwards, no one in her family heard from her. Subsequently a friend went to check on her and found her sitting in a chair in an awake unresponsive state with swelling to her left eye. EMS was called and on their arrival she was able to follow command to grip with her right hand and could also react to pain. She had right gaze deviation and weakness of her LUE. Code Stroke was called in the field.  Per EMS, CBG was normal and BP was relatively normal.   On arrival to the ED she was in an awake, unresponsive state with left sided weakness. She was rushed to CT where brain imaging revealed large completed right ACA and MCA strokes with mass effect and 8 mm of right to left midline shift.   EMS was unable to obtain the identifying information of the patient on site and she initially was given a chart name of Hall on arrival to the ED. It is uncertain whether or not she is on an anticoagulant.    No past medical history on file.  No family history on file.          Social History:  has no history on file for tobacco use, alcohol use, and drug use.  Not on File  MEDICATIONS:                                                                                                                     Unknown at time of presentation  ROS:  Unable to obtain due to verbal unresponsiveness.    There were no vitals taken for this visit.   General Examination:                                                                                                       Physical Exam  HEENT-  [place]/AT. Subcutaneous edema in left periorbital region without bruising.    Lungs- Respirations unlabored but with occasional sonorous quality Extremities- Left upper extremity with mild edema  Neurological Examination Mental Status: Obtunded. Eyes are partially open but not directing gaze towards visual stimuli.  Cranial Nerves: II: PERRL No blink to threat.  III,IV, VI: Eyes are open. Does not gaze towards or away from visual stimuli. Right gaze preference which can be overcome with oculocephalic maneuver.  V: Brisk right corneal reflex, weak left corneal reflex  VII: Does not grimace to noxious VIII: Not responding to voice IX,X: Gag reflex deferred XI: Weak on the left. Head preferentially turned to the left.  XII: Unable to assess Motor/Sensory: Not following commands for formal motor testing.  RUE: Moves hand towards location of sternal rub but not antigravity LUE with flaccid tone and no ipsilateral movement to noxious RLE withdraws weakly to noxious LLE with triple flexion response to noxious.  Deep Tendon Reflexes: 1+ bilateral brachioradialis and patellae. Toes are equivocal.   Cerebellar/Gait: Unable to assess  NIHSS: 29   Lab Results: Basic Metabolic Panel: No results for input(s): "NA", "K", "CL", "CO2", "GLUCOSE", "BUN", "CREATININE", "CALCIUM", "MG", "PHOS" in the last 168 hours.  CBC: No results for input(s): "WBC", "NEUTROABS", "HGB", "HCT", "MCV", "PLT" in the last 168 hours.  Cardiac Enzymes: No results for input(s): "CKTOTAL", "CKMB", "CKMBINDEX", "TROPONINI" in the last 168 hours.  Lipid Panel: No results for input(s): "CHOL", "TRIG", "HDL", "CHOLHDL", "VLDL", "LDLCALC" in the last 168  hours.  Imaging: No results found.  Assessment: Unidentified middle-aged [place] American female who presents to the ED after being found in an awake unresponsive state with left hemiplegia. CT head reveals large right ACA and MCA strokes with 8 mm of right to left midline shift.  - Exam reveals findings referable to the right ACA and MCA strokes seen on CT, in conjunction with obtundation and global aphasia. Pupils are equal.  - CT head: Large acute infarct involving the right ACA and MCA territory, with relative sparing of the right PCA territory, with additional likely acute infarct in the left parietal lobe and more likely chronic infarct in the left occipital lobe. No evidence of hemorrhagic transformation. Extensive cytotoxic edema with sulcal effacement and resulting mass effect on the right lateral ventricle and third ventricle, without evidence of entrapment in the left lateral ventricle. 8 mm of right-to-left midline shift at the level of the thalami. The basilar cisterns remain patent. Hyperdensity in the right MCA territory, which may reflect thrombus or be pseudo hyperdensity secondary to the hypodense right cerebral hemisphere. ASPECTS is 0 - EKG: Sinus rhythm, Low voltage, extremity and precordial leads, Consider anterior infarct, Borderline ST elevation, inferior leads - Labs:  - BUN  41, Cr 2.52, eGFR 12 - AST mildly elevated at 58 - CK elevated at 1,915, most likely due to prolonged immobilization -- WBC normal.  - Severely thrombocytopenic with platelet count of 23K/microL - PT 17.1, PTT 32 - TSH severely elevated at 72 with corresponding low T4 of <0.25 - DDx for underlying etiology of her stroke includes cardioembolic and atherothrombotic.   Recommendations: - Neurosurgery has been consulted by ED team for consideration of possible decompressive right hemicraniectomy.  - Starting hypertonic saline at 50 cc/hr.  - Repeat CT head in 6 hours to assess for stability of the edema  and mass effect (ordered) - HgbA1c, fasting lipid panel - MRI/MRA of the brain without contrast - Carotid ultrasound - PT consult, OT consult, Speech consult - Echocardiogram - Hold off on statin for now given elevated CK level - Hold off on antiplatelet medication for now due to high risk of catastrophic bleeding within the bed of the large subacute stroke  - Risk factor modification - Telemetry monitoring - Frequent neuro checks - NPO until passes stroke swallow screen - BP management per standard protocol. Out of the permissive HTN time window.    Electronically signed: Dr. Caryl PinaEric Eliazer Hemphill 08/10/2022, 1:25 AM

## 2022-08-10 NOTE — ED Provider Notes (Signed)
[place] EMERGENCY DEPARTMENT AT [facility] Provider Note   CSN: [id number] Arrival date & time: 08/10/22  0116     History {Add pertinent medical, surgical, social history, OB history to HPI:1} Chief Complaint  Patient presents with  . Code Stroke    Paula Hall is a 62 y.o. female.  Unknown age patient brought in as a code stroke.  Patient was last known normal approximately 34 hours ago at 3 PM on Friday.  No one heard from her.  Her friend went to check on her and found her sitting in a chair unresponsive with swelling to her left eye.  EMS was called.  Blood sugar normal blood pressure relatively normal was able to grip on the right side for them and respond to pain otherwise.  Had a left gaze deviation and left grip strength weakness.  No-showed activated and brought here for evaluation.  No known medications.       Home Medications Prior to Admission medications   Not on File      Allergies    Patient has no allergy information on record.    Review of Systems   Review of Systems  Physical Exam Updated Vital Signs BP 124/68   Pulse 70   Temp (!) 97.5 F (36.4 C) (Oral)   Resp 19   Ht 5\' 2"  (1.575 m)   Wt 74.2 kg   SpO2 97%   BMI 29.92 kg/m  Physical Exam Vitals and nursing note reviewed.  Constitutional:      Appearance: She is well-developed.  HENT:     Head: Normocephalic.     Comments: Periorbital edema on left Eyes:     Comments: Eyes open spontaneously but is not reliably responding to commands  Cardiovascular:     Rate and Rhythm: Normal rate and regular rhythm.  Pulmonary:     Effort: No respiratory distress.     Breath sounds: No stridor.  Abdominal:     General: There is no distension.  Musculoskeletal:     Cervical back: Normal range of motion.  Neurological:     Mental Status: She is alert.     Comments: Eyes open spontaneously but is not reliably responding to commands    ED Results / Procedures / Treatments    Labs (all labs ordered are listed, but only abnormal results are displayed) Labs Reviewed  PROTIME-INR - Abnormal; Notable for the following components:      Result Value   Prothrombin Time 17.1 (*)    INR 1.4 (*)    All other components within normal limits  CBC - Abnormal; Notable for the following components:   RBC 3.78 (*)    Hemoglobin 10.3 (*)    HCT 31.9 (*)    RDW 23.2 (*)    Platelets 23 (*)    nRBC 0.5 (*)    All other components within normal limits  DIFFERENTIAL - Abnormal; Notable for the following components:   Lymphs Abs 0.5 (*)    All other components within normal limits  COMPREHENSIVE METABOLIC PANEL - Abnormal; Notable for the following components:   Glucose, Bld 111 (*)    BUN 41 (*)    Creatinine, Ser 2.52 (*)    Calcium 8.1 (*)    Total Protein 5.8 (*)    Albumin 2.5 (*)    AST 58 (*)    GFR, Estimated 12 (*)    All other components within normal limits  T4, FREE - Abnormal; Notable  for the following components:   Free T4 <0.25 (*)    All other components within normal limits  TSH - Abnormal; Notable for the following components:   TSH 72.977 (*)    All other components within normal limits  CK - Abnormal; Notable for the following components:   Total CK 1,915 (*)    All other components within normal limits  GLUCOSE, CAPILLARY - Abnormal; Notable for the following components:   Glucose-Capillary 114 (*)    All other components within normal limits  I-STAT CHEM 8, ED - Abnormal; Notable for the following components:   BUN 38 (*)    Creatinine, Ser 2.80 (*)    Glucose, Bld 106 (*)    Calcium, Ion 1.04 (*)    Hemoglobin 11.9 (*)    HCT 35.0 (*)    All other components within normal limits  MRSA NEXT GEN BY PCR, NASAL  APTT  ETHANOL  SODIUM  HEMOGLOBIN A1C  URINALYSIS, W/ REFLEX TO CULTURE (INFECTION SUSPECTED)  SODIUM  SODIUM  CBC  BASIC METABOLIC PANEL  MAGNESIUM  PHOSPHORUS  LIPID PANEL  CBG MONITORING, ED  TYPE AND SCREEN     EKG EKG Interpretation  Date/Time:  Sunday 08-25-2022 01:41:45 EDT Ventricular Rate:  71 PR Interval:  207 QRS Duration: 87 QT Interval:  434 QTC Calculation: 475 R Axis:   45 Text Interpretation: Sinus rhythm Low voltage, extremity and precordial leads Consider anterior infarct Borderline ST elevation, inferior leads Confirmed by Marily Memos [phone]) on 25-Aug-2022 2:23:31 AM  Radiology DG Chest Portable 1 View  Result Date: 2022/08/25 CLINICAL DATA:  Altered mental status with head CT showing bilateral infarcts. EXAM: PORTABLE CHEST 1 VIEW COMPARISON:  None Available. FINDINGS: There is mild-to-moderate cardiomegaly. No vascular congestion is seen. The mediastinum is normally outlined. There is calcification of the transverse aorta. There is a moderate-sized left pleural effusion with overlying atelectasis or consolidation. There is band atelectasis in the hypoinflated right base. The remaining lungs are clear. There are degenerative changes of the spine and mild osteopenia. IMPRESSION: 1. Moderate-sized left pleural effusion with overlying atelectasis or consolidation. 2. Cardiomegaly without evidence of vascular congestion. 3. Aortic atherosclerosis. Electronically Signed   By: Almira Bar M.D.   On: Aug 25, 2022 02:03   CT HEAD CODE STROKE WO CONTRAST  Result Date: 08-25-22 CLINICAL DATA:  Code stroke.  Unresponsive EXAM: CT HEAD WITHOUT CONTRAST TECHNIQUE: Contiguous axial images were obtained from the base of the skull through the vertex without intravenous contrast. RADIATION DOSE REDUCTION: This exam was performed according to the departmental dose-optimization program which includes automated exposure control, adjustment of the mA and/or kV according to patient size and/or use of iterative reconstruction technique. COMPARISON:  None Available. FINDINGS: Brain: Hypodensity and sulcal effacement involving the right ACA and MCA territory, with relative sparing of the right PCA  territory, with sulcal effacement and loss of gray-white differentiation. This also involves the right basal ganglia with sparing of the medial thalamus. Additional hypodensity is seen in the left parietal and occipital lobes, which may be more chronic in the occipital lobe. No evidence of hemorrhagic transformation. Mass effect on the right lateral ventricle and third ventricle, with 8 mm of right-to-left midline shift at the level of the thalami. No evidence of ventricular entrapment in the left lateral ventricle. The basilar cisterns remain patent. Vascular: Hyperdensity in the right MCA territory, which may reflect thrombus or be relative to the hypodense right cerebral hemisphere. Skull: Normal. Negative for fracture  or focal lesion. Sinuses/Orbits: Clear paranasal sinuses. No acute finding in the orbits. Left periorbital edema. Other: Fluid in the left mastoid air cells. ASPECTS [facility]([place] Stroke Program Early CT Score) - Ganglionic level infarction (caudate, lentiform nuclei, internal capsule, insula, M1-M3 cortex): 0 - Supraganglionic infarction (M4-M6 cortex): 0 Total score (0-10 with 10 being normal): 0 IMPRESSION: 1. Large acute infarct involving the right ACA and MCA territory, with relative sparing of the right PCA territory, with additional likely acute infarct in the left parietal lobe and more likely chronic infarct in the left occipital lobe. No evidence of hemorrhagic transformation. 2. Extensive cytotoxic edema with sulcal effacement and resulting mass effect on the right lateral ventricle and third ventricle, without evidence of entrapment in the left lateral ventricle. 8 mm of right-to-left midline shift at the level of the thalami. The basilar cisterns remain patent. 3. Hyperdensity in the right MCA territory, which may reflect thrombus or be pseudo hyperdensity secondary to the hypodense right cerebral hemisphere. 4. ASPECTS is 0 Imaging results were communicated on 08/10/2022 at 1:31 am to  provider Dr. Otelia LimesLindzen via secure text paging. Electronically Signed   By: Wiliam KeAlison  Vasan M.D.   On: 08/10/2022 01:37    Procedures .Critical Care  Performed by: Marily MemosMesner, Davion Meara, MD Authorized by: Marily MemosMesner, Sairah Knobloch, MD   Critical care provider statement:    Critical care time (minutes):  30   Critical care was necessary to treat or prevent imminent or life-threatening deterioration of the following conditions:  CNS failure or compromise, dehydration and metabolic crisis   Critical care was time spent personally by me on the following activities:  Development of treatment plan with patient or surrogate, discussions with consultants, evaluation of patient's response to treatment, examination of patient, ordering and review of laboratory studies, ordering and review of radiographic studies, ordering and performing treatments and interventions, pulse oximetry, re-evaluation of patient's condition and review of old charts     Medications Ordered in ED Medications  sodium chloride (hypertonic) 3 % solution ( Intravenous New Bag/Given 08/10/22 0225)  docusate (COLACE) 50 MG/5ML liquid 100 mg (has no administration in time range)  polyethylene glycol (MIRALAX / GLYCOLAX) packet 17 g (has no administration in time range)  insulin aspart (novoLOG) injection 0-9 Units ( Subcutaneous Not Given 08/10/22 0439)  levothyroxine (SYNTHROID, LEVOTHROID) injection 100 mcg (has no administration in time range)  Oral care mouth rinse (has no administration in time range)  Oral care mouth rinse (has no administration in time range)  sodium chloride flush (NS) 0.9 % injection 3 mL (3 mLs Intravenous Given 08/10/22 0221)  sodium chloride 0.9 % bolus 1,000 mL (1,000 mLs Intravenous New Bag/Given 08/10/22 0440)  hydrocortisone sodium succinate (SOLU-CORTEF) 100 MG injection 100 mg (100 mg Intravenous Given 08/10/22 0433)    ED Course/ Medical Decision Making/ A&P                             Medical Decision Making Amount and/or  Complexity of Data Reviewed Labs: ordered. Radiology: ordered.  Risk Decision regarding hospitalization.   Patient brought in as code stroke however [facility] ([facility] On CT, my interpretation with large R sided stroke, Radiology noted to have large R MCA/ACA with significant edema and 8 mm midline shift without herniation. Per Neuro request, discussed with NSG, Dr. Conchita ParisNundkumar on the phone for any need for surgical intervention for decompression and did  not feel it was necessary at this time but if patient worsened, they would like to get involved. Found to have elevated creatinine.   {Document critical care time when appropriate:1} {Document review of labs and clinical decision tools ie heart score, Chads2Vasc2 etc:1}  {Document your independent review of radiology images, and any outside records:1} {Document your discussion with family members, caretakers, and with consultants:1} {Document social determinants of health affecting pt's care:1} {Document your decision making why or why not admission, treatments were needed:1} Final Clinical Impression(s) / ED Diagnoses Final diagnoses:  Cerebrovascular accident (CVA), unspecified mechanism    Rx / DC Orders ED Discharge Orders     None

## 2022-08-10 NOTE — Progress Notes (Signed)
Spoke with patient's sister and brother-in-law at bedside  Findings on CT head relayed Findings on abdominal chest CT relayed  Large massive stroke Metastatic cancer-primary is unknown at the present time  Other comorbidities discussed  Best to have palliative care involved in treatment  Prognosis is poor

## 2022-08-10 NOTE — Progress Notes (Addendum)
NAME:  Paula Hall, MRN:  [id number], DOB:  [birth year], LOS: 0 ADMISSION DATE:  08/27/22, CONSULTATION DATE:  08/27/2022 REFERRING MD:  Erin Hearing, CHIEF COMPLAINT:  Unresponsive   History of Present Illness:  Unknown PMH who was BIBEMS after being found by friend unresponsive, unable to move left side and with L eye swelling. Last known well was approximately 3pm 4/5. On EMS arrival she was able to follow command to grip with right hand, R gaze deviation, weakness of LUE. Code stroke called in field.   In ED [facility] revealed large right ACA, MCA strokes with cytotoxic edema/mass effect with 58mm midline shift. Neuro was consulted. Neurosurgery did not recommend decompressive hemicraniectomy at this time.  Pertinent  Medical History  Unknown  Significant Hospital Events: Including procedures, antibiotic start and stop dates in addition to other pertinent events   08/27/22 admitted, started on hypertonic saline. CT chest abd and pelvis: 1. Cardiomegaly with small to moderate left pleural effusion. 2. Left lower lobe and lingular consolidations 3. Scattered subcentimeter lung nodules, largest 6 mm in the right  innumerable metastases in the liver. Mets not excluded.4. Emphysema.5. Aortic and coronary artery atherosclerosis.. 2.2 cm hypodensity in the pancreatic head concerning for a mass. Bilateral lower outer breast solid masses measuring 4.6 x 2.3 x4.4 cm on the left and 2.5 x 1.2 x 2.4 cm on the right10. Moderate free ascites.11. Adrenal glands not well seen but there does appear to be bilateral adrenal fullness. Unclear if this is adenomatous hyperplasia or infiltrating disease.12. Diffuse mesenteric edema/congestion and body wall anasarca greatest in the abdomen. 13. Thickening versus nondistention of the splenic flexure and descending colon. Colitis not excluded.  Interim History / Subjective:  Laying in bed minimally responsive, she is spontaneously moving her right upper extremity  Objective   Blood  pressure 113/64, pulse 85, temperature 97.8 F (36.6 C), temperature source Axillary, resp. rate 11, height 5\' 2"  (1.575 m), weight 74.2 kg, SpO2 96 %.        Intake/Output Summary (Last 24 hours) at 2022-08-27 1504 Last data filed at 08/27/2022 1400 Gross per 24 hour  Intake 1443.94 ml  Output no documentation  Net 1443.94 ml   Filed Weights   27-Aug-2022 0159 Aug 27, 2022 0400  Weight: 67.1 kg 74.2 kg    Examination: General critically ill-appearing 62 year old female currently laying in bed HEENT normocephalic mucous membranes moist rightward gaze Pulmonary: Coarse scattered rhonchi no accessory use currently Cardiac: Regular rate and rhythm Abdomen: Soft nontender Extremities warm dry Neuro: Awake, moving right upper extremity spontaneously, paretic on the left.  Nonverbal. GU: Clear yellow.   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 Breast primary involving: breast, lung, mass on pancreatic head, +/- adrenal "fullness W/  ascites and Left pleural effusion Plan Need COG discussion In light of acute stroke not really much to do at this point If improved neurologically would need onc involvement  We are reaching out to family    AKI Improving w/ IVFs Plan Cont IVFs Renal dose meds Serial chems  Strict I&O  HFrEF (40-45%) And grade 1 diastolic HF Plan Cont tele  Hold home lasix, entresto and metformin  HL Plan Statin at dc   Thrombocytopenia plan trend cbc transfuse for PLT<10 or <  100k if active CNS bleeding  Transaminitis  Plan mild, repeat LFT in AM  Severe hypothyroid Plan Cont iV synthroid 100 mcg   Best Practice (right click and "Reselect all SmartList Selections" daily)   Diet/type: NPO DVT  prophylaxis: SCD GI prophylaxis: N/A Lines: N/A Foley:  N/A Code Status:  full code Last date of multidisciplinary goals of care discussion [do not have contact information for this patient at this time]  Critical care time: 35 minutes     Patient seen, independently examined, care plan was fully done discussed with beta-blocker possible documentation Patient admitted overnight by Dr. Thora Lance for large ACA/MCA CVA with brain compression -Extensive injury noted on CT -Being followed by neurology  CT abdomen and pelvis with what appears to be widely metastatic disease with a mass on the pancreatic head, breast mass, extensive metastatic disease in the liver, ascites, and left pleural effusion  She is minimally responsive, purposely moving right upper extremity Rightward gaze, rhonchi bilaterally S1-S2 appreciated Bowel sounds appreciated   CT head reviewed  Assessment/plan   right ACA/MCA CVA with brain compression On hypertonic saline -Frequent neurochecks -Airway watch  Metastatic disease unknown primary -With extensive metastatic disease -With very significant stroke -Unclear whether any invasive investigation will be of benefit to the patient  Acute kidney injury  Heart failure with reduced ejection fraction Grade 1 diastolic heart failure -Home medications on hold  Thrombocytopenia -Continue to monitor  Transaminitis  Hypothyroidism -On Synthroid  Prognosis is poor  Focus should be on goals of care discussions Involving palliative care early will be helpful  The patient is critically ill with multiple organ systems failure and requires high complexity decision making for assessment and support, frequent evaluation and titration of therapies, application of advanced monitoring technologies and extensive interpretation of multiple databases. Critical Care Time devoted to patient care services described in this note independent of APP/resident time (if  applicable)  is 32 minutes.   Virl Diamond MD [facility] Personal pager: See Amion If unanswered, please page CCM On-call: [phone]

## 2022-08-10 NOTE — Code Documentation (Addendum)
Stroke Response Nurse Documentation Code Documentation  Paula Hall is a 62 y.o. female arriving to [facility]  via [facility] on 4/7 with unknown past medical hx. On No antithrombotic. Code stroke was activated by EMS.   Patient from home where she was LKW at 1500 on 4/5 and now complaining of left sided weakness and AMS.   Stroke team at the bedside on patient arrival. Labs drawn and patient cleared for CT by Dr. Clayborne Dana. Patient to CT with team. NIHSS 32, see documentation for details and code stroke times. Patient with decreased LOC, disoriented, not following commands, right gaze preference , bilateral hemianopia, bilateral arm weakness, bilateral leg weakness, left decreased sensation, Global aphasia , and dysarthria  on exam. The following imaging was completed:  CT Head. Patient is not a candidate for IV Thrombolytic due to outside of window. Patient is not a candidate for IR due to completed right ACA and MCA strokes.     Bedside handoff with ED RN Casimiro Needle.    Rose Fillers  Rapid Response RN

## 2022-08-11 DIAGNOSIS — Z515 Encounter for palliative care: Secondary | ICD-10-CM

## 2022-08-11 DIAGNOSIS — I63031 Cerebral infarction due to thrombosis of right carotid artery: Secondary | ICD-10-CM

## 2022-08-11 DIAGNOSIS — I639 Cerebral infarction, unspecified: Secondary | ICD-10-CM

## 2022-08-11 LAB — GLUCOSE, CAPILLARY
Glucose-Capillary: 107 mg/dL — ABNORMAL HIGH (ref 70–99)
Glucose-Capillary: 114 mg/dL — ABNORMAL HIGH (ref 70–99)
Glucose-Capillary: 119 mg/dL — ABNORMAL HIGH (ref 70–99)
Glucose-Capillary: 119 mg/dL — ABNORMAL HIGH (ref 70–99)
Glucose-Capillary: 125 mg/dL — ABNORMAL HIGH (ref 70–99)
Glucose-Capillary: 135 mg/dL — ABNORMAL HIGH (ref 70–99)

## 2022-08-11 LAB — SODIUM: Sodium: 150 mmol/L — ABNORMAL HIGH (ref 135–145)

## 2022-08-11 LAB — TSH: TSH: 50.258 u[IU]/mL — ABNORMAL HIGH (ref 0.350–4.500)

## 2022-08-11 LAB — T4, FREE: Free T4: 0.32 ng/dL — ABNORMAL LOW (ref 0.61–1.12)

## 2022-08-11 NOTE — Consult Note (Signed)
                                                                                   Consultation Note Date: 08/11/2022 at 0945  Patient Name: Paula Hall  DOB: 07-Mar-1961  MRN: [id number]  Age / Sex: 62 y.o., female  PCP: Patient, No Pcp Per Referring Physician: Lynnell Catalan, MD  Reason for Consultation: {Reason for Consult:23484}  HPI/Patient Profile: 62 y.o. female  with past medical history of *** admitted on 08/23/2022 with ***.   Clinical Assessment and Goals of Care: I have reviewed medical records including EPIC notes, labs and imaging, assessed the patient and then met with ***  to discuss diagnosis prognosis, GOC, EOL wishes, disposition and options.  I introduced Palliative Medicine as specialized medical care for people living with serious illness. It focuses on providing relief from the symptoms and stress of a serious illness. The goal is to improve quality of life for both the patient and the family.  We discussed a brief life review of the patient.   As far as functional and nutritional status   We discussed patient's current illness and what it means in the larger context of patient's on-going co-morbidities.  Natural disease trajectory and expectations at EOL were discussed.  I attempted to elicit values and goals of care important to the patient.    The difference between aggressive medical intervention and comfort care was considered in light of the patient's goals of care.   Advance directives, concepts specific to code status, artificial feeding and hydration, and rehospitalization were considered and discussed.  Education offered regarding concept specific to human mortality and the limitations of medical interventions to prolong life when the body begins to fail to thrive.  Family is facing treatment option decisions, advanced directive, and anticipatory care needs.    Discussed with patient/family the importance of continued conversation with family and the  medical providers regarding overall plan of care and treatment options, ensuring decisions are within the context of the patient's values and GOCs.    Hospice and Palliative Care services outpatient were explained and offered.  Questions and concerns were addressed. The family was encouraged to call with questions or concerns.   Primary Decision Maker {Primary Decision [id number]}  Physical Exam  Palliative Assessment/Data:     Thank you for this consult. Palliative medicine will continue to follow and assist holistically.   Time Total: *** minutes Greater than 50%  of this time was spent counseling and coordinating care related to the above assessment and plan.  Signed by: Georgiann Cocker, DNP, FNP-BC Palliative Medicine    Please contact Palliative Medicine Team phone at [phone] for questions and concerns.  For individual provider: See Loretha Stapler

## 2022-08-11 NOTE — Progress Notes (Signed)
NAME:  Paula Hall, MRN:  [id number], DOB:  May 27, 1960, LOS: 1 ADMISSION DATE:  08/24/2022, CONSULTATION DATE:  08/24/2022 REFERRING MD:  Erin Hearing, CHIEF COMPLAINT:  Unresponsive   History of Present Illness:  Unknown PMH who was BIBEMS after being found by friend unresponsive, unable to move left side and with L eye swelling. Last known well was approximately 3pm 4/5. On EMS arrival she was able to follow command to grip with right hand, R gaze deviation, weakness of LUE. Code stroke called in field.   In ED [facility] revealed large right ACA, MCA strokes with cytotoxic edema/mass effect with 64mm midline shift. Neuro was consulted. Neurosurgery did not recommend decompressive hemicraniectomy at this time.  Pertinent  Medical History  Unknown  Significant Hospital Events: Including procedures, antibiotic start and stop dates in addition to other pertinent events   2022-08-24 admitted, started on hypertonic saline. CT chest abd and pelvis: 1. Cardiomegaly with small to moderate left pleural effusion. 2. Left lower lobe and lingular consolidations 3. Scattered subcentimeter lung nodules, largest 6 mm in the right  innumerable metastases in the liver. Mets not excluded.4. Emphysema.5. Aortic and coronary artery atherosclerosis.. 2.2 cm hypodensity in the pancreatic head concerning for a mass. Bilateral lower outer breast solid masses measuring 4.6 x 2.3 x4.4 cm on the left and 2.5 x 1.2 x 2.4 cm on the right10. Moderate free ascites.11. Adrenal glands not well seen but there does appear to be bilateral adrenal fullness. Unclear if this is adenomatous hyperplasia or infiltrating disease.12. Diffuse mesenteric edema/congestion and body wall anasarca greatest in the abdomen. 13. Thickening versus nondistention of the splenic flexure and descending colon. Colitis not excluded.  Interim History / Subjective:  Laying in bed minimally responsive, she is spontaneously moving her right upper extremity  Objective   Blood  pressure (!) 118/57, pulse 79, temperature 97.8 F (36.6 C), temperature source Axillary, resp. rate 18, height 5\' 2"  (1.575 m), weight 74.2 kg, SpO2 95 %.        Intake/Output Summary (Last 24 hours) at 08/11/2022 0839 Last data filed at 08/11/2022 0600 Gross per 24 hour  Intake 1498.18 ml  Output 420 ml  Net 1078.18 ml    Filed Weights   2022-08-24 0159 August 24, 2022 0400  Weight: 67.1 kg 74.2 kg    Examination: General critically ill-appearing 62 year old female currently laying in bed HEENT normocephalic mucous membranes moist rightward gaze Pulmonary: Coarse scattered rhonchi no accessory use currently Cardiac: Regular rate and rhythm Abdomen: Soft nontender Extremities warm dry Neuro: Awake, moving right upper extremity spontaneously, hemiplegic on the left.  Nonverbal, opens eyes to pain.   GU: Clear yellow.  Ancillary tests personally reviewed:  CT shows massive stroke involving most of the right cerebral hemisphere.  CT abdomen shows evidence of diffuse metastatic disease in the liver.  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 -PMT consultation.   Probable widely metastatic disease? Breast primary involving: breast, lung, mass on pancreatic head, +/- adrenal "fullness W/  ascites and Left pleural effusion -Need GOC discussion - Stroke alone is large enough to be fatal.  -In light of acute stroke not really much to do at this point -If improved neurologically  would need onc involvement  -We are reaching out to family   AKI -Cont IVFs -Renal dose meds -Serial chems  -Strict I&O  HFrEF (40-45%) And grade 1 diastolic HF -Hold home lasix, entresto and  metformin  Thrombocytopenia -trend cbc -transfuse for PLT<10 or <100k if active CNS bleeding  Severe hypothyroid -Cont iV synthroid 100 mcg   Best Practice (right click and "Reselect all SmartList Selections" daily)   Diet/type: NPO DVT prophylaxis: SCD GI prophylaxis: N/A Lines: N/A Foley:  N/A Code Status:  full code Last date of multidisciplinary goals of care discussion [do not have contact information for this patient at this time]  CRITICAL CARE Performed by: Lynnell Catalan  Total critical care time: 35 minutes  Critical care time was exclusive of separately billable procedures and treating other patients.  Critical care was necessary to treat or prevent imminent or life-threatening deterioration.  Critical care was time spent personally by me on the following activities: development of treatment plan with patient and/or surrogate as well as nursing, discussions with consultants, evaluation of patient's response to treatment, examination of patient, obtaining history from patient or surrogate, ordering and performing treatments and interventions, ordering and review of laboratory studies, ordering and review of radiographic studies, pulse oximetry, re-evaluation of patient's condition and participation in multidisciplinary rounds.  Lynnell Catalan, MD [facility] ICU Physician [facility] [place] [facility]  Pager: [phone] Mobile: [phone] After hours: [phone].

## 2022-08-11 NOTE — Progress Notes (Signed)
STROKE TEAM PROGRESS NOTE   INTERVAL HISTORY No family at the bedside.  Patient is lying in bed unresponsive.  She barely opens eyes to command.  She is aphasic and not following commands.  She moves right upper extremity and withdraws right lower extremity does not move the left side to painful stimuli.  Vital signs are stable.  CT head yesterday large right hemispheric infarct with significant cytotoxic edema with 8 mm right to left shift.  Effacement of right lateral and third ventricle slight dilatation of left temporal horn.  Serum sodium and at goal at 150 on hypertonic saline.   Vitals:   08/11/22 0900 08/11/22 1025 08/11/22 1148 08/11/22 1300  BP: (!) 102/56 116/67  (!) 93/56  Pulse: 64 80  66  Resp: 13 18  12   Temp:   (!) 97.1 F (36.2 C)   TempSrc:   Axillary   SpO2: 96% 96%  97%  Weight:      Height:       CBC:  Recent Labs  Lab Sep 06, 2022 0144 09-06-22 1250  WBC 8.0 8.1  NEUTROABS 6.9  --   HGB 10.3*  11.9*  11.9* 10.4*  HCT 31.9*  35.0*  35.0* 33.7*  MCV 84.4 86.6  PLT 23* 21*   Basic Metabolic Panel:  Recent Labs  Lab 09-06-2022 0144 09/06/2022 0322 09/06/22 1250 2022-09-06 1530 09/06/22 1954 08/11/22 0249  NA 138  139  138   < > 142   < > 147* 150*  K 4.6  4.4  4.6  --  4.8  --   --   --   CL 107  101  107  --  107  --   --   --   CO2 23  --  18*  --   --   --   GLUCOSE 106*  111*  106*  --  99  --   --   --   BUN 38*  41*  38*  --  44*  --   --   --   CREATININE 2.80*  2.52*  2.80*  --  2.38*  --   --   --   CALCIUM 8.1*  --  8.1*  --   --   --   MG  --   --  2.2  --   --   --   PHOS  --   --  4.7*  --   --   --    < > = values in this interval not displayed.   Lipid Panel:  Recent Labs  Lab 09-06-2022 0443  CHOL 226*  TRIG 179*  HDL 59  CHOLHDL 3.8  VLDL 36  LDLCALC 272*   HgbA1c:  Recent Labs  Lab 2022-09-06 0443  HGBA1C 5.6   Urine Drug Screen: No results for input(s): "LABOPIA", "COCAINSCRNUR", "LABBENZ", "AMPHETMU",  "THCU", "LABBARB" in the last 168 hours.  Alcohol Level  Recent Labs  Lab 06-Sep-2022 0144  ETH <10    IMAGING past 24 hours No results found.  PHYSICAL EXAM HEENT-  /AT. Subcutaneous edema in left periorbital region without bruising.    Lungs- Respirations unlabored but with occasional sonorous quality Extremities- Left upper extremity with mild edema   Neurological Examination Mental Status: Obtunded. Eyes closed.  Globally aphasic will not follow commands. Cranial Nerves: II: PERRL No blink to threat.  Right gaze deviation III,IV, VI: Eyes are open. Does not gaze towards or away from visual stimuli. Right gaze preference which  can be overcome with oculocephalic maneuver.  V: Brisk right corneal reflex, weak left corneal reflex  VII: Right facial droop VIII: Not responding to voice IX,X: Gag reflex deferred XI: Weak on the left. Head preferentially turned to the left.  XII: Unable to assess Motor/Sensory: Not following commands for formal motor testing.  RUE: Moves hand towards location of sternal rub but not antigravity LUE with flaccid tone RLE with flaccid tone LLE with triple flexion response to noxious.  Plantars: Right toe down going Left toe upgoing  Cerebellar/Gait: Unable to assess   ASSESSMENT/PLAN [name] is a 62 y.o. female with history of abdominal hernia, anemia, asthma, depression, diabetes, GERD, heart murmur, hypertension, congestive heart failure, hypothyroidism stroke, sleep apnea noncompliant with CPAP. She presented to the ED from home via EMS as a code stroke for unresponsiveness and left hemiplegia.  His last seen on Friday around 3 PM when a friend visited her home.  No one had heard from her since then and a friend went to check on her and found her sitting in the chair unresponsive with swelling to her left eye.   Stroke:  Large acute infarct involving the right ACA and MCA territory, with relative sparing of the right PCA territory, with  additional likely acute infarct in the left parietal lobe and more likely chronic infarct in the left occipital lobe. Etiology:  T ICA occlusion   Code Stroke CT head Large acute infarct involving the right ACA and MCA territory, with relative sparing of the right PCA territory, with additional likely acute infarct in the left parietal lobe and more likely chronic infarct in the left occipital lobe. No evidence of hemorrhagic transformation. Extensive cytotoxic edema with sulcal effacement and resulting mass effect on the right lateral ventricle and third ventricle, without evidence of entrapment in the left lateral ventricle. 8 mm of right-to-left midline shift at the level of the thalami. The basilar cisterns remain patent. Hyperdensity in the right MCA territory, which may reflect thrombus or be pseudo hyperdensity secondary to the hypodense right cerebral hemisphere. ASPECTS is 0  CT Head Repeat-right cerebellar hemisphere infarction from a right ACAT occlusion.  Contralateral similar appearing cytotoxic edema at the posterior left MCA/ACA/PCA watershed area.  8 mm leftward shift 2D Echo EF 40-45%, grade 1 diastolic dysfunction  LDL 131 [id number] 5.6 VTE prophylaxis - SCDs    Diet   Diet NPO time specified   No antithrombotic prior to admission, now on No antithrombotic.  Therapy recommendations:  Pending Disposition:  pendin g  Hypertension Home meds: Lasix, Entresto, Aldactone Stable Permissive hypertension (OK if < 220/120) but gradually normalize in 5-7 days Long-term BP goal normotensive  Hyperlipidemia Home meds: Crestor, resumed in hospital when able to take PO LDL 131, goal < 70 Continue statin at discharge  Diabetes type II Uncontrolled Home meds: Metformin, Jardiance HgbA1c 5.6, goal < 7.0 CBGs Recent Labs    08/11/22 0353 08/11/22 0750 08/11/22 1117  GLUCAP 119* 114* 135*    SSI  Other Stroke Risk Factors Advanced Age >/= 1765  Hx stroke/TIA Congestive heart  failure- Sees Dr. Donato SchultzMark Skains   Lasix, Sherryll BurgerEntresto, Aldactone Sleep apnea  Other Active Problems Hypothyroidism TSH 72.977 T4 less than 0.25 Home synthroid dose 137mcg Thrombocytopenia  Plt 23 Acute kidney injury CK - 1915 Cr - 2.8  Hospital day # 1   I have personally obtained history,examined this patient, reviewed notes, independently viewed imaging studies, participated in medical decision making and plan of  care.ROS completed by me personally and pertinent positives fully documented  I have made any additions or clarifications directly to the above note. Agree with note above.  Patient is exam remains poor with large right hemispheric infarct with significant cytotoxic edema and right to left midline shift.  The chances of making meaningful neurological recovery and surviving and are quite negligible.  Family needs to make decision about goals of care palliative care approach would be recommended.  Continue hypertonic saline with serum sodium goal 150-155.  Discussed with Dr. Denese Killings critical care medicine.This patient is critically ill and at significant risk of neurological worsening, death and care requires constant monitoring of vital signs, hemodynamics,respiratory and cardiac monitoring, extensive review of multiple databases, frequent neurological assessment, discussion with family, other specialists and medical decision making of high complexity.I have made any additions or clarifications directly to the above note.This critical care time does not reflect procedure time, or teaching time or supervisory time of PA/NP/Med Resident etc but could involve care discussion time.  I spent 30 minutes of neurocritical care time  in the care of  this patient.       Delia Heady, MD Medical Director [facility] Pager: [phone] 08/11/2022 3:08 PM      Windell Norfolk, MD Neurology 08/11/2022 3:04 PM    To contact Stroke Continuity provider, please refer to  [email or web address]. After hours, contact General Neurology

## 2022-08-11 NOTE — Progress Notes (Signed)
   08/11/22 1216  Peripheral IV 08/11/22 20 G 1.88" Anterior;Left;Upper Arm  Placement Date/Time: 08/11/22 1214   Ultrasound Used?: Yes, depth greater than 63mm  Size (Gauge): 20 G  Size (Length): 1.88"  Orientation: Anterior;Left;Upper  Location: Arm  Site Prep: Chlorhexidine (preferred)  Person Inserting LDA: EC RN  Insertion...  Site Assessment Clean, Dry, Intact  Line Status Blood return noted;Saline locked;Flushed  Dressing Type Transparent  Dressing Status Clean, Dry, Intact  Interventions New dressing  Dressing Change Due 08/18/22     Primary RN informed of US/PIV which was placed in the LUE. Per protocol do not use site for hyperosmolar solutions.    No other sites visible with US guidance. Please contact us with any questions. Thank you.

## 2022-08-12 DIAGNOSIS — I6300 Cerebral infarction due to thrombosis of unspecified precerebral artery: Secondary | ICD-10-CM

## 2022-08-12 DIAGNOSIS — I63311 Cerebral infarction due to thrombosis of right middle cerebral artery: Secondary | ICD-10-CM

## 2022-08-12 LAB — GLUCOSE, CAPILLARY
Glucose-Capillary: 111 mg/dL — ABNORMAL HIGH (ref 70–99)
Glucose-Capillary: 119 mg/dL — ABNORMAL HIGH (ref 70–99)
Glucose-Capillary: 122 mg/dL — ABNORMAL HIGH (ref 70–99)
Glucose-Capillary: 126 mg/dL — ABNORMAL HIGH (ref 70–99)
Glucose-Capillary: 129 mg/dL — ABNORMAL HIGH (ref 70–99)
Glucose-Capillary: 131 mg/dL — ABNORMAL HIGH (ref 70–99)

## 2022-08-12 NOTE — Progress Notes (Signed)
  Progress Note   Date: 08/12/2022  Patient Name: Paula Hall        MRN#: [id number]  Renal failure of unknown duration.

## 2022-08-12 NOTE — Progress Notes (Signed)
STROKE TEAM PROGRESS NOTE   INTERVAL HISTORY No family at the bedside.  Patient is lying in bed  awake but unresponsive.     She is aphasic and not following commands.  She moves right upper extremity and withdraws right lower extremity does not move the left side to painful stimuli.  Vital signs are stable.     Neurological exam unchanged  Vitals:   08/12/22 1000 08/12/22 1100 08/12/22 1142 08/12/22 1300  BP: 105/79 109/64  (!) 100/56  Pulse: 82 79  (!) 47  Resp: (!) 21 (!) 21  17  Temp:   98.4 F (36.9 C)   TempSrc:   Axillary   SpO2: 100% 100%  97%  Weight:      Height:       CBC:  Recent Labs  Lab 09-09-22 0144 09-09-22 1250  WBC 8.0 8.1  NEUTROABS 6.9  --   HGB 10.3*  11.9*  11.9* 10.4*  HCT 31.9*  35.0*  35.0* 33.7*  MCV 84.4 86.6  PLT 23* 21*   Basic Metabolic Panel:  Recent Labs  Lab 09/09/22 0144 09-09-22 0322 09-09-22 1250 Sep 09, 2022 1530 2022/09/09 1954 08/11/22 0249  NA 138  139  138   < > 142   < > 147* 150*  K 4.6  4.4  4.6  --  4.8  --   --   --   CL 107  101  107  --  107  --   --   --   CO2 23  --  18*  --   --   --   GLUCOSE 106*  111*  106*  --  99  --   --   --   BUN 38*  41*  38*  --  44*  --   --   --   CREATININE 2.80*  2.52*  2.80*  --  2.38*  --   --   --   CALCIUM 8.1*  --  8.1*  --   --   --   MG  --   --  2.2  --   --   --   PHOS  --   --  4.7*  --   --   --    < > = values in this interval not displayed.   Lipid Panel:  Recent Labs  Lab 09-09-2022 0443  CHOL 226*  TRIG 179*  HDL 59  CHOLHDL 3.8  VLDL 36  LDLCALC 932*   HgbA1c:  Recent Labs  Lab 2022/09/09 0443  HGBA1C 5.6   Urine Drug Screen: No results for input(s): "LABOPIA", "COCAINSCRNUR", "LABBENZ", "AMPHETMU", "THCU", "LABBARB" in the last 168 hours.  Alcohol Level  Recent Labs  Lab Sep 09, 2022 0144  ETH <10    IMAGING past 24 hours No results found.  PHYSICAL EXAM HEENT-  [place]/AT. Subcutaneous edema in left periorbital region without bruising.     Lungs- Respirations unlabored but with occasional sonorous quality Extremities- Left upper extremity with mild edema   Neurological Examination Mental Status: Obtunded. Eyes closed.  Globally aphasic will not follow commands. Cranial Nerves: II: PERRL No blink to threat.  Right gaze deviation III,IV, VI: Eyes are open. Does not gaze towards or away from visual stimuli. Right gaze preference which can be overcome with oculocephalic maneuver.  V: Brisk right corneal reflex, weak left corneal reflex  VII: Right facial droop VIII: Not responding to voice IX,X: Gag reflex deferred XI: Weak on the left. Head preferentially turned  to the left.  XII: Unable to assess Motor/Sensory: Not following commands for formal motor testing.  RUE: Moves hand towards location of sternal rub but not antigravity LUE with flaccid tone RLE with flaccid tone LLE with triple flexion response to noxious.  Plantars: Right toe down going Left toe upgoing  Cerebellar/Gait: Unable to assess   ASSESSMENT/PLAN Paula Hall is a 62 y.o. female with history of abdominal hernia, anemia, asthma, depression, diabetes, GERD, heart murmur, hypertension, congestive heart failure, hypothyroidism stroke, sleep apnea noncompliant with CPAP. She presented to the ED from home via EMS as a code stroke for unresponsiveness and left hemiplegia.  His last seen on Friday around 3 PM when a friend visited her home.  No one had heard from her since then and a friend went to check on her and found her sitting in the chair unresponsive with swelling to her left eye.   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 Etiology:  T ICA occlusion   Code Stroke CT head Large acute infarct involving the right ACA and MCA territory, with relative  sparing of the right PCA territory, with additional likely acute infarct in the left parietal lobe and more likely chronic infarct in the left occipital lobe. No evidence of hemorrhagic transformation. Extensive cytotoxic edema with sulcal effacement and resulting mass effect on the right lateral ventricle and third ventricle, without evidence of entrapment in the left lateral ventricle. 8 mm of right-to-left midline shift at the level of the thalami. The basilar cisterns remain patent. Hyperdensity in the right MCA territory, which may reflect thrombus or be pseudo hyperdensity secondary to the hypodense right cerebral hemisphere. ASPECTS is 0  CT Head Repeat-right cerebellar hemisphere infarction from a right ACAT occlusion.  Contralateral similar appearing cytotoxic edema at the posterior left MCA/ACA/PCA watershed area.  8 mm leftward shift 2D Echo EF 40-45%, grade 1 diastolic dysfunction  LDL 131 [id number] 5.6 VTE prophylaxis - SCDs    Diet   Diet NPO time specified   No antithrombotic prior to admission, now on No antithrombotic.  Therapy recommendations:  Pending Disposition:  pendin g  Hypertension Home meds: Lasix, Entresto, Aldactone Stable Permissive hypertension (OK if < 220/120) but gradually normalize in 5-7 days Long-term BP goal normotensive  Hyperlipidemia Home meds: Crestor, resumed in hospital when able to take PO LDL 131, goal < 70 Continue statin at discharge  Diabetes type II Uncontrolled Home meds: Metformin, Jardiance HgbA1c 5.6, goal < 7.0 CBGs Recent Labs    08/12/22 0739 08/12/22 1113 08/12/22 1550  GLUCAP 122* 119* 126*    SSI  Other Stroke Risk Factors Advanced Age >/= 23  Hx stroke/TIA Congestive heart failure- Sees Dr. Donato Schultz   Lasix, Sherryll Burger, Aldactone Sleep apnea  Other Active Problems Hypothyroidism TSH 72.977 T4 less than 0.25 Home synthroid dose Thrombocytopenia  Plt 23 Acute kidney injury CK - 1915 Cr -  2.8  Hospital day # 2    Patient is exam remains poor with large right hemispheric infarct with significant cytotoxic edema and right to left midline shift.  The chances of making meaningful neurological recovery and surviving and are quite negligible.  Family needs to make decision about goals of care palliative care approach would be recommended.  Discontinue hypertonic saline due to poor ic  access.Will call daughter and discuss prognosis. Discussed with Dr. Denese KillingsAgarwala critical care medicine.This patient is critically ill and at significant risk of neurological worsening, death and care requires constant monitoring of vital signs, hemodynamics,respiratory and cardiac monitoring, extensive review of multiple databases, frequent neurological assessment, discussion with family, other specialists and medical decision making of high complexity.I have made any additions or clarifications directly to the above note.This critical care time does not reflect procedure time, or teaching time or supervisory time of PA/NP/Med Resident etc but could involve care discussion time.  I spent 30 minutes of neurocritical care time  in the care of  this patient.       Paula HeadyPramod Nusaybah Ivie, Paula Hall Medical Director [facility] Pager: [phone] 08/12/2022 4:09 PM      Paula NorfolkAmadou Camara, Paula Hall Neurology 08/12/2022 4:09 PM    To contact Stroke Continuity provider, please refer to [email or web address]. After hours, contact General Neurology

## 2022-08-12 NOTE — Progress Notes (Addendum)
Palliative Care Progress Note, Assessment & Plan   Patient Name: Paula Hall       Date: 08/12/2022 DOB: Oct 09, 1960  Age: 62 y.o. MRN#: [id number] Attending Physician: Lynnell Catalan, MD Primary Care Physician: Patient, No Pcp Per Admit Date: 09/01/2022  Subjective: Patient is lying in bed.  She does not acknowledge my presence.  She is unable to make her wishes known.  She makes no verbalizations during my encounter.  Respirations are even and unlabored.  HPI: 62 y.o. female  with an unknown PMH admitted on 08/06/2022 after being found unresponsive and unable to move left side with left eye swelling.   CT of head revealed large right ACA, MCA strokes with cytotoxic edema/mass effect with 8 mm midline shift.   Neuro and neurosurgery were consulted.   Neurosurgery did not recommend decompressive hemicraniectomy at that time.   Course of hospitalization has revealed widely metastatic disease-primarily likely breast but also involving lung, pancreatic head, and adrenal fullness with ascites and left pleural effusion.   PMT was consulted to discuss goals of care.  Summary of counseling/coordination of care: After reviewing the patient's chart and assessing the patient at bedside, I met with patient's sister Paula Hall, her husband Paula Hall, their daughter Paula Hall, and PMT RN Dawn in 4N conference room.  Using WebEx, I also met virtually meeting with patient's sisters Paula Hall and Paula Hall, brother Paula Hall, and nieces/nephews Paula Hall, Paula Hall, and Paula Hall.   Of note, patient has no HCPOA/living will, was never married, and has no children.  Majority of patient's recent available siblings will be next of kin decision maker.  I introduced palliative medicine and our role in patient's care team.  I showed patient's  CT imaging of head and abdomen while discussing patient's extensive injury from stroke as well as metastatic cancer.  Discussed extensive damage and midline shift in addition to edema. Highlighted innumerable mets to liver, pancreatic head, adrenals, and ascites. Questions addressed.  Family had several questions for neurology.  Neurology asked to speak directly with patient's Sister Paula Hall and other siblings in order to more adequately answer their questions/concerns.  We discussed that patient's stroke in and of itself is significant enough to be permanently life altering and brings a significantly poor prognosis.  Additionally, I outlined that patient's metastatic cancer is also independently creating a grim prognosis.  I attempted to elicit values and goals important to patient and family.  CODE STATUS, boundaries of care, and treatment plans discussed in detail.  Patient's brother Paula Hall was against patient remaining a full code as he would never want patient to be placed on a mechanical ventilator.  Full code versus DNR status described in detail.  No family consensus was reached and thus full code remains.  Patient's sisters Paula Hall, and Paula Hall were in agreement they would like for patient to remain comfortable.  However, patient's Sister Paula Hall and brother Paula Hall would like further clarification from neurology as far as prognosis.  I personally spoke with Paula Hall after the family meeting and he plans to speak directly with family to answer their additional concerns.  Hospice philosophy and hospice inpatient unit discussed.  Comfort measures versus aggressive measures reviewed.  Therapeutic silence and active listening  provided for family to share their thoughts and emotions regarding current medical situation.  Emotional support provided.  Family would like time to discuss among themselves about next steps for patient.  Family not prepared to put boundaries of care in place at this  time.  Full code and full scope remains.  Family awaiting call from neurology.  PMT will continue to follow.  Physical Exam Vitals reviewed.  Constitutional:      General: She is not in acute distress.    Appearance: She is ill-appearing.  HENT:     Head: Normocephalic.  Cardiovascular:     Rate and Rhythm: Normal rate.     Pulses: Normal pulses.  Pulmonary:     Effort: Pulmonary effort is normal.  Abdominal:     Palpations: Abdomen is soft.  Skin:    General: Skin is warm and dry.  Neurological:     Comments: nonverbal             Total Time 120 minutes   Champion Corales L. Manon Hilding, FNP-BC Palliative Medicine Team Team Phone # [phone]

## 2022-08-12 NOTE — Progress Notes (Signed)
NAME:  Paula Hall, MRN:  [id number], DOB:  1960-07-27, LOS: 2 ADMISSION DATE:  08/20/2022, CONSULTATION DATE:  08/26/2022 REFERRING MD:  Erin Hearing, CHIEF COMPLAINT:  Unresponsive   History of Present Illness:  Unknown PMH who was BIBEMS after being found by friend unresponsive, unable to move left side and with L eye swelling. Last known well was approximately 3pm 4/5. On EMS arrival she was able to follow command to grip with right hand, R gaze deviation, weakness of LUE. Code stroke called in field.   In ED [facility] revealed large right ACA, MCA strokes with cytotoxic edema/mass effect with 67mm midline shift. Neuro was consulted. Neurosurgery did not recommend decompressive hemicraniectomy at this time.  Pertinent  Medical History  Unknown  Significant Hospital Events: Including procedures, antibiotic start and stop dates in addition to other pertinent events   08/12/2022 admitted, started on hypertonic saline. CT chest abd and pelvis: 1. Cardiomegaly with small to moderate left pleural effusion. 2. Left lower lobe and lingular consolidations 3. Scattered subcentimeter lung nodules, largest 6 mm in the right  innumerable metastases in the liver. Mets not excluded.4. Emphysema.5. Aortic and coronary artery atherosclerosis.. 2.2 cm hypodensity in the pancreatic head concerning for a mass. Bilateral lower outer breast solid masses measuring 4.6 x 2.3 x4.4 cm on the left and 2.5 x 1.2 x 2.4 cm on the right10. Moderate free ascites.11. Adrenal glands not well seen but there does appear to be bilateral adrenal fullness. Unclear if this is adenomatous hyperplasia or infiltrating disease.12. Diffuse mesenteric edema/congestion and body wall anasarca greatest in the abdomen. 13. Thickening versus nondistention of the splenic flexure and descending colon. Colitis not excluded.  Interim History / Subjective:  Laying in bed minimally responsive, she is spontaneously moving her right upper extremity  Objective   Blood  pressure 121/71, pulse (!) 47, temperature 98.4 F (36.9 C), temperature source Axillary, resp. rate 19, height 5\' 2"  (1.575 m), weight 76.2 kg, SpO2 98 %.        Intake/Output Summary (Last 24 hours) at 08/12/2022 1230 Last data filed at 08/11/2022 1800 Gross per 24 hour  Intake --  Output 150 ml  Net -150 ml    Filed Weights   09/02/2022 0400 08/11/22 0714 08/12/22 0500  Weight: 74.2 kg 78.1 kg 76.2 kg    Examination: General critically ill-appearing 61 year old female currently laying in bed HEENT normocephalic mucous membranes moist rightward gaze Pulmonary: Coarse scattered rhonchi no accessory use currently Cardiac: Regular rate and rhythm Abdomen: Soft nontender Extremities warm dry Neuro: Awake, moving right upper extremity spontaneously, hemiplegic on the left.  Nonverbal, opens eyes to pain.   GU: Clear yellow.  Ancillary tests personally reviewed:  CT shows massive stroke involving most of the right cerebral hemisphere.  CT abdomen shows evidence of diffuse metastatic disease in the liver.  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 - edema likely to get worse given size of infarct.  Further treatment unlikely to materially affect outcome.  Family meeting -SBP <220 and avoid hypotension -airway watch, but would want to avoid intubation due to dismal prognosis. -PMT consultation.   Probable widely metastatic disease? Breast primary involving: breast, lung, mass on pancreatic head, +/- adrenal "fullness W/  ascites and Left pleural effusion -Need GOC discussion - Stroke alone is large enough to be fatal.  -  In light of acute stroke not really much to do at this point -Unlikely that she would be a candidate for anything more than palliative chemotherapy even without complicating stroke. -Family meeting  today  AKI -Cont IVFs -Renal dose meds -Serial chems  -Strict I&O  HFrEF (40-45%) And grade 1 diastolic HF -Hold home lasix, entresto and metformin  Thrombocytopenia -trend cbc -transfuse for PLT<10 or <100k if active CNS bleeding  Severe hypothyroid -Cont iV synthroid 100 mcg   Best Practice (right click and "Reselect all SmartList Selections" daily)   Diet/type: NPO DVT prophylaxis: SCD GI prophylaxis: N/A Lines: N/A Foley:  N/A Code Status:  full code Last date of multidisciplinary goals of care discussion: Patient's sister was updated yesterday of grim prognosis and recommendation to transition to comfort care.  Lynnell Catalan, MD [facility] ICU Physician [facility] Critical Care  Pager: [phone] Mobile: [phone] After hours: [phone].

## 2022-08-13 DIAGNOSIS — Z7189 Other specified counseling: Secondary | ICD-10-CM

## 2022-08-13 DIAGNOSIS — Z66 Do not resuscitate: Secondary | ICD-10-CM

## 2022-08-13 DIAGNOSIS — C799 Secondary malignant neoplasm of unspecified site: Secondary | ICD-10-CM

## 2022-08-13 LAB — GLUCOSE, CAPILLARY
Glucose-Capillary: 141 mg/dL — ABNORMAL HIGH (ref 70–99)
Glucose-Capillary: 147 mg/dL — ABNORMAL HIGH (ref 70–99)

## 2022-08-13 MED ORDER — SCOPOLAMINE 1 MG/3DAYS TD PT72
1.0000 | MEDICATED_PATCH | TRANSDERMAL | Status: DC
  Administered 2022-08-13: 1.5 mg via TRANSDERMAL
  Filled 2022-08-13: qty 1

## 2022-08-13 MED ORDER — SODIUM CHLORIDE 0.9 % IV SOLN: INTRAVENOUS | Status: DC

## 2022-08-13 MED ORDER — MORPHINE SULFATE (PF) 2 MG/ML IV SOLN
2.0000 mg | INTRAVENOUS | Status: DC | PRN
  Administered 2022-08-13: 2 mg via INTRAVENOUS
  Filled 2022-08-13 (×2): qty 1

## 2022-08-13 MED ORDER — SODIUM CHLORIDE 0.9 % IV BOLUS
250.0000 mL | Freq: Once | INTRAVENOUS | Status: AC
  Administered 2022-08-13: 250 mL via INTRAVENOUS

## 2022-08-13 NOTE — Progress Notes (Signed)
STROKE TEAM PROGRESS NOTE   INTERVAL HISTORY Her Sister Paula Hall and her husband are at the bedside.  Patient is lying in bed  awake but unresponsive.     She is aphasic and not following commands.  She moves right upper extremity and withdraws right lower extremity does not move the left side to painful stimuli.  Vital signs are unstable with blood pressure running in the 70-80 systolic range Neurological exam unchanged  Vitals:   08/13/22 1135 08/13/22 1235 08/13/22 1335 08/13/22 1405  BP: (!) 89/34 (!) 80/56 (!) 71/43 (!) 93/57  Pulse: 87 82 85 84  Resp: (!) 31 (!) 29 (!) 29 (!) 24  Temp:      TempSrc:      SpO2: 99% 100% 100% 100%  Weight:      Height:       CBC:  Recent Labs  Lab 08/09/2022 0144 08/29/2022 1250  WBC 8.0 8.1  NEUTROABS 6.9  --   HGB 10.3*  11.9*  11.9* 10.4*  HCT 31.9*  35.0*  35.0* 33.7*  MCV 84.4 86.6  PLT 23* 21*   Basic Metabolic Panel:  Recent Labs  Lab 08/22/2022 0144 08/13/2022 0322 08/11/2022 1250 08/13/2022 1530 08/16/2022 1954 08/11/22 0249  NA 138  139  138   < > 142   < > 147* 150*  K 4.6  4.4  4.6  --  4.8  --   --   --   CL 107  101  107  --  107  --   --   --   CO2 23  --  18*  --   --   --   GLUCOSE 106*  111*  106*  --  99  --   --   --   BUN 38*  41*  38*  --  44*  --   --   --   CREATININE 2.80*  2.52*  2.80*  --  2.38*  --   --   --   CALCIUM 8.1*  --  8.1*  --   --   --   MG  --   --  2.2  --   --   --   PHOS  --   --  4.7*  --   --   --    < > = values in this interval not displayed.   Lipid Panel:  Recent Labs  Lab 08/18/2022 0443  CHOL 226*  TRIG 179*  HDL 59  CHOLHDL 3.8  VLDL 36  LDLCALC 449*   HgbA1c:  Recent Labs  Lab 08/05/2022 0443  HGBA1C 5.6   Urine Drug Screen: No results for input(s): "LABOPIA", "COCAINSCRNUR", "LABBENZ", "AMPHETMU", "THCU", "LABBARB" in the last 168 hours.  Alcohol Level  Recent Labs  Lab 08/21/2022 0144  ETH <10    IMAGING past 24 hours No results found.  PHYSICAL  EXAM HEENT-  [place]/AT. Subcutaneous edema in left periorbital region without bruising.    Lungs- Respirations unlabored but with occasional sonorous quality Extremities- Left upper extremity with mild edema   Neurological Examination Mental Status: Obtunded. Eyes intermittently open and closed.  Globally aphasic will not follow commands. Cranial Nerves: II: PERRL No blink to threat.  Right gaze deviation III,IV, VI: Eyes are open. Does not gaze towards or away from visual stimuli. Right gaze preference which can be overcome with oculocephalic maneuver.  V: Brisk right corneal reflex, weak left corneal reflex  VII: Right facial droop VIII: Not responding  to voice IX,X: Gag reflex deferred XI: Weak on the left. Head preferentially turned to the left.  XII: Unable to assess Motor/Sensory: Not following commands for formal motor testing.  RUE: Moves hand towards location of sternal rub but not antigravity LUE with flaccid tone RLE with flaccid tone LLE with triple flexion response to noxious.  Plantars: Right toe down going Left toe upgoing  Cerebellar/Gait: Unable to assess   ASSESSMENT/PLAN [name] is a 62 y.o. female with history of abdominal hernia, anemia, asthma, depression, diabetes, GERD, heart murmur, hypertension, congestive heart failure, hypothyroidism stroke, sleep apnea noncompliant with CPAP. She presented to the ED from home via EMS as a code stroke for unresponsiveness and left hemiplegia.  His last seen on Friday around 3 PM when a friend visited her home.  No one had heard from her since then and a friend went to check on her and found her sitting in the chair unresponsive with swelling to her left eye.   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 Etiology:  T ICA  occlusion   Code Stroke CT head Large acute infarct involving the right ACA and MCA territory, with relative sparing of the right PCA territory, with additional likely acute infarct in the left parietal lobe and more likely chronic infarct in the left occipital lobe. No evidence of hemorrhagic transformation. Extensive cytotoxic edema with sulcal effacement and resulting mass effect on the right lateral ventricle and third ventricle, without evidence of entrapment in the left lateral ventricle. 8 mm of right-to-left midline shift at the level of the thalami. The basilar cisterns remain patent. Hyperdensity in the right MCA territory, which may reflect thrombus or be pseudo hyperdensity secondary to the hypodense right cerebral hemisphere. ASPECTS is 0  CT Head Repeat-right cerebellar hemisphere infarction from a right ACAT occlusion.  Contralateral similar appearing cytotoxic edema at the posterior left MCA/ACA/PCA watershed area.  8 mm leftward shift 2D Echo EF 40-45%, grade 1 diastolic dysfunction  LDL 131 [id number] 5.6 VTE prophylaxis - SCDs    There are no active orders of the following types: Diet, Nourishments.   No antithrombotic prior to admission, now on No antithrombotic.  Therapy recommendations:  Pending Disposition:  pendin g  Hypertension Home meds: Lasix, Entresto, Aldactone Stable Permissive hypertension (OK if < 220/120) but gradually normalize in 5-7 days Long-term BP goal normotensive  Hyperlipidemia Home meds: Crestor, resumed in hospital when able to take PO LDL 131, goal < 70 Continue statin at discharge  Diabetes type II Uncontrolled Home meds: Metformin, Jardiance HgbA1c 5.6, goal < 7.0 CBGs Recent Labs    08/12/22 2356 08/13/22 0346 08/13/22 0745  GLUCAP 129* 141* 147*    SSI  Other Stroke Risk Factors Advanced Age >/= 4965  Hx stroke/TIA Congestive heart failure- Sees Dr. Donato SchultzMark Skains   Lasix, Sherryll BurgerEntresto, Aldactone Sleep apnea  Other Active  Problems Hypothyroidism TSH 72.977 T4 less than 0.25 Home synthroid dose 137mcg Thrombocytopenia  Plt 23 Acute kidney injury CK - 1915 Cr - 2.8  Hospital day # 3   Patient is exam remains poor with large right hemispheric infarct with significant cytotoxic edema and right to left midline shift.  Her neurological exam is nearly suggestive of vegetative state.  The chances of making meaningful neurological recovery and surviving and  are quite negligible.  I had a long discussion with the patient's sister at the bedside, her husband as well as her brother via FaceTime and explained her poor prognosis and reviewed brain imaging and answered questions.  Family understands her poor prognosis and would like to give time for other family members to arrive in order to make a final decision about withdrawal of care.  They are willing to talk to palliative care team as well.. Discussed with Dr. Denese Killings critical care medicine.   This patient is critically ill and at significant risk of neurological worsening, death and care requires constant monitoring of vital signs, hemodynamics,respiratory and cardiac monitoring, extensive review of multiple databases, frequent neurological assessment, discussion with family, other specialists and medical decision making of high complexity.I have made any additions or clarifications directly to the above note.This critical care time does not reflect procedure time, or teaching time or supervisory time of PA/NP/Med Resident etc but could involve care discussion time.  I spent 35 minutes of neurocritical care time  in the care of  this patient.        Delia Heady, MD Medical Director [facility] Stroke Center Pager: [phone] 08/13/2022 4:39 PM      Windell Norfolk, MD Neurology 08/13/2022 4:39 PM    To contact Stroke Continuity provider, please refer to [email or web address]. After hours, contact General Neurology

## 2022-08-13 NOTE — Progress Notes (Signed)
                                                     Palliative Care Progress Note, Assessment & Plan   Patient Name: BASYA GRUENBERG       Date: 08/13/2022 DOB: 27-Dec-1960  Age: 62 y.o. MRN#: [id number] Attending Physician: Lynnell Catalan, MD Primary Care Physician: Patient, No Pcp Per Admit Date: 08/14/2022  Subjective: Patient is lying in bed.  She does not acknowledge my presence.  She is unable to make her wishes known.  She makes no verbalizations during my encounter.  Respirations are even and unlabored. No s/s of distress.  HPI: 62 y.o. female  with an unknown PMH admitted on 09/01/2022 after being found unresponsive and unable to move left side with left eye swelling.   CT of head revealed large right ACA, MCA strokes with cytotoxic edema/mass effect with 8 mm midline shift.   Neuro and neurosurgery were consulted.   Neurosurgery did not recommend decompressive hemicraniectomy at that time.   Course of hospitalization has revealed widely metastatic disease-primarily likely breast but also involving lung, pancreatic head, and adrenal fullness with ascites and left pleural effusion.   PMT was consulted to discuss goals of care.  Summary of counseling/coordination of care: Chart reviewed extensively. Received update from both Drs. Sethi and Agarwala. Both had been in to speak to patient's sister this morning at bedside.  Went to bedside to speak with sister. She has excellent understanding of situation and prognosis. She would like support in helping her siblings understand situation. One sister is coming to town tomorrow from [place] to see patient. [name] shares that arranging another family meeting may be beneficial. She has my contact information and will call and let me know when the best time for a family meeting would be. She feels it will be crucial to  have neurology there to explain prognosis from stroke standpoint.  Patient was transitioned to DNR this AM following conversation with physicians.  Sister emotionally supported throughout conversation, discussed advocating for family members.  Will await callback from sister to arrange family meeting when she is aware of best timing.   Physical Exam Vitals reviewed.  Constitutional:      General: She is not in acute distress.    Appearance: She is ill-appearing.     Comments: No s/s distress  HENT:     Head: Normocephalic.  Pulmonary:     Effort: Pulmonary effort is normal.  Abdominal:     Palpations: Abdomen is soft.  Musculoskeletal:     Right lower leg: No edema.     Left lower leg: No edema.  Skin:    General: Skin is warm and dry.  Neurological:     Comments: nonverbal            Gerlean Ren, DNP, [facility] Palliative Medicine Team Team Phone # [phone]  Pager # [phone]

## 2022-08-13 NOTE — Progress Notes (Signed)
This chaplain responded to NP-Devon consult for family spiritual care in the space of major life transition. Family is not at the bedside at the time of the visit. The chaplain is appreciative of the update from RN-Trish. The chaplain will attempt a revisit today.  Chaplain Stephanie Acre [phone]

## 2022-08-13 NOTE — Progress Notes (Signed)
NAME:  Paula Hall, MRN:  [id number], DOB:  1961/02/15, LOS: 3 ADMISSION DATE:  08-25-2022, CONSULTATION DATE:  2022/08/25 REFERRING MD:  Erin Hearing, CHIEF COMPLAINT:  Unresponsive   History of Present Illness:  Unknown PMH who was BIBEMS after being found by friend unresponsive, unable to move left side and with L eye swelling. Last known well was approximately 3pm 4/5. On EMS arrival she was able to follow command to grip with right hand, R gaze deviation, weakness of LUE. Code stroke called in field.   In ED [facility] revealed large right ACA, MCA strokes with cytotoxic edema/mass effect with 66mm midline shift. Neuro was consulted. Neurosurgery did not recommend decompressive hemicraniectomy at this time.  Pertinent  Medical History  Unknown  Significant Hospital Events: Including procedures, antibiotic start and stop dates in addition to other pertinent events   2022-08-25 admitted, started on hypertonic saline. CT chest abd and pelvis: 1. Cardiomegaly with small to moderate left pleural effusion. 2. Left lower lobe and lingular consolidations 3. Scattered subcentimeter lung nodules, largest 6 mm in the right  innumerable metastases in the liver. Mets not excluded.4. Emphysema.5. Aortic and coronary artery atherosclerosis.. 2.2 cm hypodensity in the pancreatic head concerning for a mass. Bilateral lower outer breast solid masses measuring 4.6 x 2.3 x4.4 cm on the left and 2.5 x 1.2 x 2.4 cm on the right10. Moderate free ascites.11. Adrenal glands not well seen but there does appear to be bilateral adrenal fullness. Unclear if this is adenomatous hyperplasia or infiltrating disease.12. Diffuse mesenteric edema/congestion and body wall anasarca greatest in the abdomen. 13. Thickening versus nondistention of the splenic flexure and descending colon. Colitis not excluded.  Interim History / Subjective:  Laying in bed minimally responsive, she is spontaneously moving her right upper extremity. Hypotension today,  improved with fluid bolus.   Objective   Blood pressure (!) 76/60, pulse 90, temperature 97.6 F (36.4 C), temperature source Oral, resp. rate (!) 30, height 5\' 2"  (1.575 m), weight 76.2 kg, SpO2 94 %.        Intake/Output Summary (Last 24 hours) at 08/13/2022 1008 Last data filed at 08/13/2022 0900 Gross per 24 hour  Intake 292.78 ml  Output 200 ml  Net 92.78 ml    Filed Weights   08/11/22 0714 08/12/22 0500 08/13/22 0500  Weight: 78.1 kg 76.2 kg 76.2 kg    Examination: General critically ill-appearing 62 year old female currently laying in bed HEENT normocephalic mucous membranes moist rightward gaze Pulmonary: Sonorous breathing. Coarse scattered rhonchi no accessory use currently Cardiac: Regular rate and rhythm Abdomen: Soft nontender Extremities warm dry Neuro: Somnolent. No spontaneous movement, hemiplegic on the left.  Nonverbal, opens eyes to pain.   GU: Clear yellow.  Ancillary tests personally reviewed:  CT shows massive stroke involving most of the right cerebral hemisphere.  CT abdomen shows evidence of diffuse metastatic disease in the liver.  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 Now DNR. Awaiting for other family to come into town.  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   AKI -Cont IVFs  HFrEF (40-45%) And grade 1 diastolic  HF -Hold home lasix, entresto and metformin  Thrombocytopenia  Severe hypothyroid -Cont iV synthroid 100 mcg   Best Practice (right click and "Reselect all SmartList Selections" daily)   Diet/type:  NPO DVT prophylaxis: SCD GI prophylaxis: N/A Lines: N/A Foley:  N/A Code Status:  DNR - transitioning to comfort care  Last date of multidisciplinary goals of care discussion: Patient's sister was updated yesterday of grim prognosis and recommendation to transition to comfort care.   Lynnell Catalan, MD [facility] ICU Physician [facility]  Pager: [phone] Mobile: [phone] After hours: [phone].

## 2022-08-14 ENCOUNTER — Encounter: Payer: Self-pay | Admitting: Podiatry

## 2022-09-03 NOTE — Discharge Summary (Signed)
DEATH SUMMARY   Patient Details  Name: Paula Hall MRN: [id number] DOB: 07/08/1960  Admission/Discharge Information   Admit Date:  08/30/22  Date of Death: Date of Death: 09/03/22  Time of Death: Time of Death: 0207  Length of Stay: 4  Referring Physician: Patient, No Pcp Per   Reason(s) for Hospitalization  CVA  Diagnoses  Preliminary cause of death: Cerebral herniation with Left MCA CVA  Secondary Diagnoses (including complications and co-morbidities):  Principal Problem:   Stroke Metastatic cancer of unknown source.  hypothyroidism  Brief Hospital Course (including significant findings, care, treatment, and services provided and events leading to death)  Paula Hall is a 62 y.o. year old female who unresponsive at home.  Right gaze deviation and left-sided hemiplegia.  CT scan showed large ACA MCA stroke with mass effect.  Further workup revealed evidence of extensive metastatic disease throughout her chest and liver.  She remained minimally responsive.  Goals of care conversations were had with on several occasions with family.  They were informed that taken separately either the stroke or the malignancy would be fatal and that the combination pretended a dismal prognosis and therefore any advanced therapy would have been futile.  The family eventually came to the conclusion to make the patient comfort care, DNR.  She passed away peacefully.  Pertinent Labs and Studies  Significant Diagnostic Studies CT HEAD WO CONTRAST ( )  Result Date: 08-30-22 CLINICAL DATA:  Stroke, follow-up EXAM: CT HEAD WITHOUT CONTRAST TECHNIQUE: Contiguous axial images were obtained from the base of the skull through the vertex without intravenous contrast. RADIATION DOSE REDUCTION: This exam was performed according to the departmental dose-optimization program which includes automated exposure control, adjustment of the mA and/or kV according to patient size and/or use of iterative reconstruction  technique. COMPARISON:  08-30-22 FINDINGS: Brain: Redemonstrated subtotal right hemisphere cytotoxic edema, sparing only the right PCA territory, including the right thalamus. Significant intracranial mass effect, with effacement of the right lateral ventricle and third ventricle, sulcal effacement, and 8 mm right-to-left midline shift, unchanged. Similar cytotoxic edema in the left parietal and occipital region, in the watershed territory. No evidence of hemorrhagic transformation. Gray-white differentiation is preserved in the left cerebral hemisphere. No definite entrapment of the left lateral ventricle; possible slight increase size of the left temporal horn (series 3, image 10). The basilar cisterns are patent. Vascular: Hyperdensity in the right ICA terminus and MCA, which is more prominent than on the prior exam. Skull: Negative for fracture or focal lesion. Sinuses/Orbits: Clear paranasal sinuses. No acute finding in the orbits. Other: Fluid in left mastoid air cells. Left frontal and periorbital hematoma. IMPRESSION: 1. Redemonstrated subtotal right hemisphere cytotoxic edema, with and 8 mm right-to-left midline shift, unchanged. Similar cytotoxic edema in the left parietal and occipital region, in the watershed territory, without evidence of hemorrhagic transformation. 2. Unchanged effacement of the right lateral ventricle and third ventricle, with possible slight increase size of the left temporal horn, which could indicate developing entrapment. Attention on follow-up. Electronically Signed   By: Wiliam Ke M.D.   On: 08-30-2022 23:11   ECHOCARDIOGRAM COMPLETE  Result Date: 08/30/2022    ECHOCARDIOGRAM REPORT   Patient Name:   Paula Hall Date of Exam: 2022-08-30 Medical Rec #:  [id number]     Height:       62.0 in Accession #:    [id number]    Weight:       163.6 lb Date of Birth:  1960/05/19  BSA:          1.755 m Patient Age:    61 years      BP:           107/66 mmHg Patient Gender: F              HR:           87 bpm. Exam Location:  Inpatient Procedure: 2D Echo, Color Doppler, Cardiac Doppler and Strain Analysis Indications:     Stroke i63.9  History:         Patient has prior history of Echocardiogram examinations, most                  recent 02/21/2022. Risk Factors:Hypertension, Diabetes and                  Sleep Apnea.  Sonographer:     Irving Burton Senior RDCS Referring Phys:  Aliene Beams Diagnosing Phys: Thurmon Fair MD IMPRESSIONS  1. Left ventricular ejection fraction, by estimation, is 40 to 45%. The left ventricle has mildly decreased function. The left ventricle demonstrates global hypokinesis. There is mild concentric left ventricular hypertrophy. Left ventricular diastolic parameters are consistent with Grade I diastolic dysfunction (impaired relaxation). Elevated left atrial pressure. The average left ventricular global longitudinal strain is 8.8 %. The global longitudinal strain is abnormal.  2. Right ventricular systolic function is mildly reduced. The right ventricular size is normal.  3. Ascites and irregular liver contour are present.  4. The mitral valve is grossly normal. Trivial mitral valve regurgitation.  5. There is prominent focal thickening of the left coronary cusp. 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 The aortic valve is tricuspid. Aortic valve regurgitation is moderate to severe. Aortic valve sclerosis is present, with no evidence of aortic valve stenosis. Aortic regurgitation PHT measures 339 msec.  6. There is borderline dilatation of the ascending aorta, measuring 37 mm.  7. The inferior vena cava is normal in size with greater than 50% respiratory variability, suggesting right atrial pressure of 3 mmHg. Conclusion(s)/Recommendation(s): Consider a transesophageal echocardiogram to confirm endocarditis if clinically  indicated. FINDINGS  Left Ventricle: Left ventricular ejection fraction, by estimation, is 40 to 45%. The left ventricle has mildly decreased function. The left ventricle demonstrates global hypokinesis. The average left ventricular global longitudinal strain is 8.8 %. The global longitudinal strain is abnormal. The left ventricular internal cavity size was normal in size. There is mild concentric left ventricular hypertrophy. Left ventricular diastolic parameters are consistent with Grade I diastolic dysfunction (impaired  relaxation). Elevated left atrial pressure. Right Ventricle: The right ventricular size is normal. No increase in right ventricular wall thickness. Right ventricular systolic function is mildly reduced. Left Atrium: Left atrial size was normal in size. Right Atrium: Right atrial size was normal in size. Pericardium: Ascites and irregular liver contour are present. There is no evidence of pericardial effusion. Mitral Valve: The mitral valve is grossly normal. Trivial mitral valve regurgitation. Tricuspid Valve: The tricuspid valve is grossly normal. Tricuspid valve regurgitation is trivial. Aortic Valve: There is prominent focal thickening of the left coronary cusp. 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 The aortic valve is tricuspid. Aortic valve regurgitation is moderate to severe. Aortic regurgitation PHT measures 339 msec. Aortic valve sclerosis is present,  with no evidence of aortic valve stenosis. Pulmonic Valve: The pulmonic valve was grossly normal. Pulmonic valve regurgitation is trivial. Aorta: The aortic root is normal in size and structure. There is borderline dilatation of the ascending aorta, measuring 37 mm. Venous: The inferior vena cava is normal in size with greater than 50% respiratory variability, suggesting right atrial pressure  of 3 mmHg. IAS/Shunts: No atrial level shunt detected by color flow Doppler.  LEFT VENTRICLE PLAX 2D LVIDd:         4.20 cm     Diastology LVIDs:         3.00 cm     LV e' medial:    3.81 cm/s LV PW:         1.20 cm     LV E/e' medial:  17.0 LV IVS:        1.20 cm     LV e' lateral:   5.98 cm/s LVOT diam:     1.90 cm     LV E/e' lateral: 10.9 LV SV:         45 LV SV Index:   25          2D Longitudinal Strain LVOT Area:     2.84 cm    2D Strain GLS Avg:     8.8 %  LV Volumes (MOD) LV vol d, MOD A2C: 87.0 ml LV vol d, MOD A4C: 95.3 ml LV vol s, MOD A2C: 50.6 ml LV vol s, MOD A4C: 49.8 ml LV SV MOD A2C:     36.4 ml LV SV MOD A4C:     95.3 ml LV SV MOD BP:      39.8 ml RIGHT VENTRICLE RV S prime:     9.14 cm/s TAPSE (M-mode): 1.7 cm LEFT ATRIUM             Index        RIGHT ATRIUM           Index LA diam:        3.30 cm 1.88 cm/m   RA Area:     12.80 cm LA Vol (A2C):   38.5 ml 21.94 ml/m  RA Volume:   29.40 ml  16.75 ml/m LA Vol (A4C):   46.1 ml 26.27 ml/m LA Biplane Vol: 42.2 ml 24.04 ml/m  AORTIC VALVE LVOT Vmax:   102.00 cm/s LVOT Vmean:  74.700 cm/s LVOT VTI:    0.157 m AI PHT:      339 msec  AORTA Ao Root diam: 2.90 cm Ao Asc diam:  3.70 cm MITRAL VALVE MV Area (PHT): 3.24 cm    SHUNTS MV Decel Time: 234 msec    Systemic VTI:  0.16 m MV E velocity: 64.90 cm/s  Systemic Diam: 1.90 cm MV A velocity: 88.10 cm/s MV E/A ratio:  0.74 Mihai Croitoru MD Electronically signed by Thurmon Fair MD Signature Date/Time: 08/30/2022/12:46:26 PM    Final (Updated)    CT HEAD WO CONTRAST ( )  Result Date: 09/02/2022 CLINICAL DATA:  Female of unknown age status post code stroke presentation with subtotal right hemisphere infarction. EXAM: CT HEAD WITHOUT CONTRAST TECHNIQUE: Contiguous axial images were obtained from the base of the skull through the vertex without intravenous contrast. RADIATION DOSE REDUCTION: This exam was performed according to the departmental dose-optimization program which includes automated  exposure control, adjustment of the mA and/or kV according to patient size and/or use of iterative reconstruction technique. COMPARISON:  Head CT 0122 hours today. FINDINGS: Brain: Subtotal  right hemisphere cytotoxic edema redemonstrated sparing only the right PCA territory including the right thalamus. No hemorrhagic transformation in the right hemisphere. But intracranial mass effect including effaced right lateral ventricle and leftward midline shift of 8 mm which is stable. Similar cytotoxic edema in the posterior left hemisphere at the watershed area of the distal ACA, MCA, and PCA territories. No hemorrhagic transformation there. Stable gray-white differentiation elsewhere, including brainstem and cerebellum. Difficult to exclude early trapping of the left lateral ventricle, left occipital horn is mildly dilated but the left temporal horn remains diminutive. Basilar cisterns remain normal. Vascular: Hyperdense right ICA terminus, right M1. Skull: Intact, negative. Sinuses/Orbits: Mild left mastoid effusion. Other Visualized paranasal sinuses and mastoids are clear. Other: Left lateral and superior convexity scalp hematoma. Left periorbital superficial hematoma. Globes and intraorbital soft tissues remain normal. IMPRESSION: 1. 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 - contralateral similar appearing cytotoxic edema at the posterior left ACA/MCA/PCA watershed area. - No hemorrhagic transformation. 2. Stable intracranial mass effect including 8 mm of leftward midline shift. Difficult to exclude early trapping of the left lateral ventricle. Basilar cisterns remain normal. 3. Left lateral and periorbital scalp hematoma. Electronically Signed   By: Odessa Fleming M.D.   On: 08/13/22 08:38   CT CHEST ABDOMEN PELVIS WO CONTRAST  Result Date: 08-13-22 CLINICAL DATA:  Evaluate left pleural effusion, loculated ascites. EXAM: CT CHEST, ABDOMEN  AND PELVIS WITHOUT CONTRAST TECHNIQUE: Multidetector CT imaging of the chest, abdomen and pelvis was performed following the standard protocol without IV contrast. RADIATION DOSE REDUCTION: This exam was performed according to the departmental dose-optimization program which includes automated exposure control, adjustment of the mA and/or kV according to patient size and/or use of iterative reconstruction technique. COMPARISON:  Only relevant comparisons today's portable chest. FINDINGS: CT CHEST FINDINGS Cardiovascular: The heart is moderately enlarged with left chamber predominance. There are three-vessel coronary artery calcifications, no significant pericardial effusion. The cardiac blood pool is lower in density than the myocardium suggesting anemia. There is aortic atherosclerosis, scattered plaques in the great vessels. No aneurysm is seen. The pulmonary trunk is upper normal caliber. Pulmonary veins are nondistended. Mediastinum/Nodes: No thyroid nodule is seen. Axillary spaces or free of adenopathy. There are shotty subcentimeter mediastinal nodes in the left anterior prevascular space subcarinal area but no enlarged intrathoracic lymph nodes are seen without contrast. The thoracic trachea, both main bronchi, and thoracic esophagus are unremarkable. Lungs/Pleura: Small to moderate left pleural effusion. There is adjacent patchy consolidation or atelectasis in the left lower lobe basal segments. Opacity more in keeping with consolidations noted in the posterior lingula, probable pneumonia. There are mild-to-moderate emphysematous changes with centrilobular changes predominating and with paraseptal change in the apical areas. There is an accessory azygous lobe and fissure in the medial right apex. There are coarse linear markings in the right lower lobe which could be atelectasis or linear scarring. There is a 6 mm pleural-based right upper lobe nodule anteriorly on 5:57, and scattered tiny 2-3 mm nodules,  for example in the right upper lobe on 5:37, 5:63 in the right middle lobe anteriorly on 5:94, and in the left temporal lobe on 5:57 and 5:46. The nodules are nonspecific in light of the patient's breast masses and innumerable metastases in the liver. Metastases are not excluded. Rest of the lungs are clear.  There are no right pleural fluid. Musculoskeletal: There are healed fracture deformities of some of the posteromedial right  ribs but no destructive osseous lesion is seen. There is thoracic kyphosis, mild dextroscoliosis and multilevel bridging enthesopathy, osteopenia and degenerative changes. Diffuse chest wall anasarca is noted eccentric to the left possibly positional. In the posterior lower outer left breast, there is an ovoid well-circumscribed solid mass measuring 4.6 x 2.3 x 4.4 cm. In the lower outer right breast, a smaller solid mass also oval and well-circumscribed is noted and measures 2.5 x 1.2 x 2.4 cm. These masses could represent a primary or metastatic neoplasms or fibroadenomas. CT ABDOMEN PELVIS FINDINGS Hepatobiliary: The liver is 15.4 cm in length. There are numerable diffuse hypodense masses of the liver consistent with metastases. Index lesions are as follows: In the dome of segment 8, index lesion measures 2.6 cm; Inferiorly in segment 5, index lesion measuring 3.6 cm on 3:60; In segment 4B, index lesion measuring 3.3 cm on 3:51; In segment 2, index lesion measuring 2.6 cm on 3:47. Most of the metastases are subcentimeter up to about 1.5 cm in size. Gallbladder and bile ducts are unremarkable. Pancreas: There is a 2.2 cm hypodensity on 3:62 in the pancreatic head concerning for a mass. No other focal abnormality is seen without contrast no ductal dilatation. Spleen: No abnormality. Adrenals/Urinary Tract: The adrenal glands are not well seen due to ascites but there does appear to be bilateral adrenal fullness. Unclear if this is due to adenomatous hyperplasia or infiltrating disease. No  focal abnormality is seen in the unenhanced renal cortex and no urinary stones or obstruction. There is no bladder thickening. 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 The bypassed stomach is contracted and not well seen. No small bowel dilatation or wall thickening is evident. An appendix is not seen in this patient. The large bowel wall without focal abnormality. There is mild thickening versus nondistention the splenic flexure and descending colon. There is generalized mesenteric edema/congestion. Vascular/Lymphatic: Heavy aortic and branch vessel atherosclerosis. No AAA. No appreciable adenopathy without contrast. Reproductive: Uterus and bilateral adnexa are unremarkable. Other: Moderate diffuse free ascites. Diffuse moderate to severe body wall anasarca. There is diffuse abdominal wall laxity and outward protrusion with wide rectus diastasis. No incarcerated hernia is seen. There is no free air, free hemorrhage or abscess. Musculoskeletal: No destructive bone lesion is seen. There is osteopenia with degenerative changes of the lumbar spine. IMPRESSION: 1. Cardiomegaly with small to moderate left pleural effusion. 2. Left lower lobe and lingular consolidations most likely due to pneumonia. 3. Scattered subcentimeter lung nodules, largest 6 mm in the right upper lobe. These are nonspecific in light of the patient's breast masses and innumerable metastases in the liver. Mets not excluded. 4. Emphysema. 5. Aortic and coronary artery atherosclerosis. 6. Low-density cardiac blood pool suggesting anemia. 7. 2.2 cm hypodensity in the pancreatic head concerning for a mass. MRI without and with contrast recommended. 8. Innumerable liver metastases. 9. Bilateral lower outer breast solid masses measuring 4.6 x 2.3 x 4.4 cm on the left and 2.5 x 1.2 x 2.4 cm on the right. These could represent primary or metastatic neoplasms or fibroadenomas. 10. Moderate free ascites. 11. Adrenal glands not well seen but there  does appear to be bilateral adrenal fullness. Unclear if this is adenomatous hyperplasia or infiltrating disease. 12. Diffuse mesenteric edema/congestion and body wall anasarca greatest in the abdomen. 13. Thickening versus nondistention of the splenic flexure and descending colon. Colitis not excluded. 14. Diffuse abdominal wall laxity and outward protrusion with wide rectus diastasis. No incarcerated hernia. 15. Osteopenia and degenerative  changes. Aortic Atherosclerosis (ICD10-I70.0) and Emphysema (ICD10-J43.9). Electronically Signed   By: Almira Bar M.D.   On: 09/01/2022 07:04   DG Chest Portable 1 View  Result Date: 08/27/2022 CLINICAL DATA:  Altered mental status with head CT showing bilateral infarcts. EXAM: PORTABLE CHEST 1 VIEW COMPARISON:  None Available. FINDINGS: There is mild-to-moderate cardiomegaly. No vascular congestion is seen. The mediastinum is normally outlined. There is calcification of the transverse aorta. There is a moderate-sized left pleural effusion with overlying atelectasis or consolidation. There is band atelectasis in the hypoinflated right base. The remaining lungs are clear. There are degenerative changes of the spine and mild osteopenia. IMPRESSION: 1. Moderate-sized left pleural effusion with overlying atelectasis or consolidation. 2. Cardiomegaly without evidence of vascular congestion. 3. Aortic atherosclerosis. Electronically Signed   By: Almira Bar M.D.   On: 08/17/2022 02:03   CT HEAD CODE STROKE WO CONTRAST  Result Date: 08/13/2022 CLINICAL DATA:  Code stroke.  Unresponsive EXAM: CT HEAD WITHOUT CONTRAST TECHNIQUE: Contiguous axial images were obtained from the base of the skull through the vertex without intravenous contrast. RADIATION DOSE REDUCTION: This exam was performed according to the departmental dose-optimization program which includes automated exposure control, adjustment of the mA and/or kV according to patient size and/or use of iterative  reconstruction technique. COMPARISON:  None Available. FINDINGS: Brain: Hypodensity and sulcal effacement involving the right ACA and MCA territory, with relative sparing of the right PCA territory, with sulcal effacement and loss of gray-white differentiation. This also involves the right basal ganglia with sparing of the medial thalamus. Additional hypodensity is seen in the left parietal and occipital lobes, which may be more chronic in the occipital lobe. No evidence of hemorrhagic transformation. Mass effect on the right lateral ventricle and third ventricle, with 8 mm of right-to-left midline shift at the level of the thalami. No evidence of ventricular entrapment in the left lateral ventricle. The basilar cisterns remain patent. Vascular: Hyperdensity in the right MCA territory, which may reflect thrombus or be relative to the hypodense right cerebral hemisphere. Skull: Normal. Negative for fracture or focal lesion. Sinuses/Orbits: Clear paranasal sinuses. No acute finding in the orbits. Left periorbital edema. Other: Fluid in the left mastoid air cells. ASPECTS [facility] Stroke Program Early CT Score) - Ganglionic level infarction (caudate, lentiform nuclei, internal capsule, insula, M1-M3 cortex): 0 - Supraganglionic infarction (M4-M6 cortex): 0 Total score (0-10 with 10 being normal): 0 IMPRESSION: 1. Large acute infarct involving the right ACA and MCA territory, with relative sparing of the right PCA territory, with additional likely acute infarct in the left parietal lobe and more likely chronic infarct in the left occipital lobe. No evidence of hemorrhagic transformation. 2. Extensive cytotoxic edema with sulcal effacement and resulting mass effect on the right lateral ventricle and third ventricle, without evidence of entrapment in the left lateral ventricle. 8 mm of right-to-left midline shift at the level of the thalami. The basilar cisterns remain patent. 3. Hyperdensity in the right MCA territory,  which may reflect thrombus or be pseudo hyperdensity secondary to the hypodense right cerebral hemisphere. 4. ASPECTS is 0 Imaging results were communicated on 08/13/2022 at 1:31 am to provider Dr. Otelia Limes via secure text paging. Electronically Signed   By: Wiliam Ke M.D.   On: 08/15/2022 01:37    Microbiology Recent Results (from the past 240 hour(s))  MRSA Next Gen by PCR, Nasal     Status: None   Collection Time: 08/08/2022  4:25 AM   Specimen: Nasal Mucosa;  Nasal Swab  Result Value Ref Range Status   MRSA by PCR Next Gen NOT DETECTED NOT DETECTED Final    Comment: (NOTE) The GeneXpert MRSA Assay (FDA approved for NASAL specimens only), is one component of a comprehensive MRSA colonization surveillance program. It is not intended to diagnose MRSA infection nor to guide or monitor treatment for MRSA infections. Test performance is not FDA approved in patients less than 72 years old. Performed at [facility], [address] [place], [place] [address]     Lab Basic Metabolic Panel: Recent Labs  Lab 08/17/2022 0144 08/18/2022 0322 08/31/2022 1250 08/05/2022 1530 08/05/2022 1954 08/11/22 0249  NA 138  139  138 138 142 144 147* 150*  K 4.6  4.4  4.6  --  4.8  --   --   --   CL 107  101  107  --  107  --   --   --   CO2 23  --  18*  --   --   --   GLUCOSE 106*  111*  106*  --  99  --   --   --   BUN 38*  41*  38*  --  44*  --   --   --   CREATININE 2.80*  2.52*  2.80*  --  2.38*  --   --   --   CALCIUM 8.1*  --  8.1*  --   --   --   MG  --   --  2.2  --   --   --   PHOS  --   --  4.7*  --   --   --    Liver Function Tests: Recent Labs  Lab 08/24/2022 0144  AST 58*  ALT 32  ALKPHOS 119  BILITOT 0.5  PROT 5.8*  ALBUMIN 2.5*   No results for input(s): "LIPASE", "AMYLASE" in the last 168 hours. No results for input(s): "AMMONIA" in the last 168 hours. CBC: Recent Labs  Lab 08/12/2022 0144 08/29/2022 1250  WBC 8.0 8.1  NEUTROABS 6.9  --   HGB 10.3*  11.9*   11.9* 10.4*  HCT 31.9*  35.0*  35.0* 33.7*  MCV 84.4 86.6  PLT 23* 21*   Cardiac Enzymes: Recent Labs  Lab 08/04/2022 0144  CKTOTAL 1,915*   Sepsis Labs: Recent Labs  Lab 08/20/2022 0144 08/16/2022 1250  WBC 8.0 8.1    Procedures/Operations  None   Marijah Larranaga 08/11/2022, 3:40 PM

## 2022-09-03 NOTE — Progress Notes (Signed)
Pt taken to morgue at this time. Post-mortem checklist completed by nightshift RN, Rockwell Alexandria. Post-mortem care completed by this RN after family visitation was finished. Pt's sister states that she is considering funeral homes at this time and has not made a final decision yet.   Robina Ade, RN

## 2022-09-03 NOTE — Progress Notes (Signed)
    0207  Vitals  ECG Heart Rate (!) 0 (Time of death)   Pt expired with family present . Pt exhibited agonal breathing followed by asystole .Death confirmed by me and Lincoln Brigham, RN . MD in Box notified

## 2022-09-03 DEATH — deceased
